# Patient Record
Sex: Female | Born: 1950
Health system: Southern US, Community
[De-identification: ages and names within clinical notes are randomized; demographics above are authoritative.]

## PROBLEM LIST (undated history)

## (undated) DIAGNOSIS — E785 Hyperlipidemia, unspecified: Secondary | ICD-10-CM

## (undated) DIAGNOSIS — G8929 Other chronic pain: Secondary | ICD-10-CM

## (undated) DIAGNOSIS — I10 Essential (primary) hypertension: Secondary | ICD-10-CM

## (undated) DIAGNOSIS — IMO0002 Reserved for concepts with insufficient information to code with codable children: Secondary | ICD-10-CM

## (undated) DIAGNOSIS — I7121 Aneurysm of the ascending aorta, without rupture: Secondary | ICD-10-CM

## (undated) DIAGNOSIS — G4700 Insomnia, unspecified: Secondary | ICD-10-CM

## (undated) DIAGNOSIS — N289 Disorder of kidney and ureter, unspecified: Secondary | ICD-10-CM

## (undated) DIAGNOSIS — E119 Type 2 diabetes mellitus without complications: Secondary | ICD-10-CM

## (undated) DIAGNOSIS — I2510 Atherosclerotic heart disease of native coronary artery without angina pectoris: Secondary | ICD-10-CM

## (undated) DIAGNOSIS — M549 Dorsalgia, unspecified: Secondary | ICD-10-CM

## (undated) DIAGNOSIS — I712 Thoracic aortic aneurysm, without rupture: Secondary | ICD-10-CM

## (undated) DIAGNOSIS — N183 Chronic kidney disease, stage 3 unspecified: Secondary | ICD-10-CM

## (undated) DIAGNOSIS — J309 Allergic rhinitis, unspecified: Secondary | ICD-10-CM

## (undated) HISTORY — DX: Atherosclerotic heart disease of native coronary artery without angina pectoris: I25.10

## (undated) HISTORY — DX: Other chronic pain: G89.29

## (undated) HISTORY — DX: Type 2 diabetes mellitus without complications: E11.9

## (undated) HISTORY — PX: CHOLECYSTECTOMY: SHX55

## (undated) HISTORY — DX: Thoracic aortic aneurysm, without rupture: I71.2

## (undated) HISTORY — DX: Essential (primary) hypertension: I10

## (undated) HISTORY — DX: Dorsalgia, unspecified: M54.9

## (undated) HISTORY — DX: Reserved for concepts with insufficient information to code with codable children: IMO0002

## (undated) HISTORY — DX: Chronic kidney disease, stage 3 unspecified: N18.30

## (undated) HISTORY — DX: Hyperlipidemia, unspecified: E78.5

## (undated) HISTORY — DX: Aneurysm of the ascending aorta, without rupture: I71.21

## (undated) HISTORY — PX: TUBAL LIGATION: SHX77

## (undated) HISTORY — DX: Insomnia, unspecified: G47.00

## (undated) HISTORY — DX: Allergic rhinitis, unspecified: J30.9

---
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
## 2000-11-17 ENCOUNTER — Encounter: Payer: Self-pay | Admitting: Emergency Medicine

## 2000-11-17 ENCOUNTER — Emergency Department (HOSPITAL_COMMUNITY): Admission: EM | Admit: 2000-11-17 | Discharge: 2000-11-17 | Payer: Self-pay

## 2010-12-23 ENCOUNTER — Encounter: Payer: Self-pay | Admitting: Internal Medicine

## 2010-12-24 ENCOUNTER — Ambulatory Visit (INDEPENDENT_AMBULATORY_CARE_PROVIDER_SITE_OTHER): Payer: Medicare Other | Admitting: Internal Medicine

## 2010-12-24 ENCOUNTER — Encounter: Payer: Self-pay | Admitting: Internal Medicine

## 2010-12-24 VITALS — BP 120/70 | HR 73 | Temp 97.7°F | Ht 64.0 in | Wt 283.0 lb

## 2010-12-24 DIAGNOSIS — R0609 Other forms of dyspnea: Secondary | ICD-10-CM

## 2010-12-24 DIAGNOSIS — R0600 Dyspnea, unspecified: Secondary | ICD-10-CM | POA: Insufficient documentation

## 2010-12-24 DIAGNOSIS — I10 Essential (primary) hypertension: Secondary | ICD-10-CM

## 2010-12-24 DIAGNOSIS — R059 Cough, unspecified: Secondary | ICD-10-CM

## 2010-12-24 DIAGNOSIS — R05 Cough: Secondary | ICD-10-CM

## 2010-12-24 MED ORDER — VALSARTAN-HYDROCHLOROTHIAZIDE 160-12.5 MG PO TABS: 1.0000 | ORAL_TABLET | Freq: Every day | ORAL | Status: DC

## 2010-12-24 NOTE — Progress Notes (Signed)
  Subjective:    Patient ID: Kara Nunez, female    DOB: 15-Sep-1950, 60 y.o.   MRN: [id number]  HPI  Refer by [name]  59 yowf never smoked with doe going all the way back to childhood eval by pulmonologist in [place] in 1980s with no specific finding;  and never had allergies/ asthma or need for meds.  12/24/2010  Initial pulmonary office eval on chronic ace cc cough and sob x sev years gradually  Worse with daily doe x across the room ok sitting still but has to prop head to sleep assoc with wheezing better p albuterol which helps mostly  dry cough assoc with nasal congestion and sneezing.  Pt denies any significant sore throat, dysphagia, itching, excess/ purulent nasal  secretions,  fever, chills, sweats, unintended wt loss, clasically  pleuritic or exertional cp (what she does have is migratory/ tightness, fleeitn, hempoptysis, orthopnea pnd or significant  leg swelling.    Also denies any obvious fluctuation of symptoms with weather or environmental changes or other aggravating or alleviating factors.     Review of Systems  Constitutional: Negative for fever, chills and unexpected weight change.  HENT: Positive for congestion and sneezing. Negative for ear pain, nosebleeds, sore throat, rhinorrhea, trouble swallowing, dental problem, voice change, postnasal drip and sinus pressure.   Eyes: Negative for visual disturbance.  Respiratory: Positive for cough and shortness of breath. Negative for choking.   Cardiovascular: Positive for chest pain and leg swelling.  Gastrointestinal: Negative for vomiting, abdominal pain and diarrhea.  Genitourinary: Negative for difficulty urinating.  Musculoskeletal: Negative for arthralgias.  Skin: Negative for rash.  Neurological: Negative for tremors, syncope and headaches.  Hematological: Does not bruise/bleed easily.       Objective:   Physical Exam    hoarse amb female nad.  Wt 283 12/24/10 with classic pseudowheeze with fvc, resolves with  plm  HEENT: nl dentition, turbinates, and orophanx. Nl external ear canals without cough reflex   NECK :  without JVD/Nodes/TM/ nl carotid upstrokes bilaterally   LUNGS: no acc muscle use, clear to A and P bilaterally without cough on insp or exp maneuvers   CV:  RRR  no s3 or murmur or increase in P2, no edema   ABD:  Obese but soft and nontender with nl excursion in the supine position. No bruits or organomegaly, bowel sounds nl  MS:  warm without deformities, calf tenderness, cyanosis or clubbing  SKIN: warm and dry without lesions    NEURO:  alert, approp, no deficits      Assessment & Plan:

## 2010-12-24 NOTE — Patient Instructions (Signed)
Stop lisnopril  Start diovan 160/12.5 one daily in place of lisinopril  Ok albuterol if helps your breathing or cough but you should find by the time you return you need it much less  GERD (REFLUX)  is an extremely common cause of respiratory symptoms, many times with no significant heartburn at all.    It can be treated with medication, but also with lifestyle changes including avoidance of late meals, excessive alcohol, smoking cessation, and avoid fatty foods, chocolate, peppermint, colas, red wine, and acidic juices such as orange juice.  NO MINT OR MENTHOL PRODUCTS SO NO COUGH DROPS  USE SUGARLESS CANDY INSTEAD (jolley ranchers or Stover's)  NO OIL BASED VITAMINS   Please schedule a follow up office visit in 4 weeks, sooner if needed with PFT's

## 2010-12-25 DIAGNOSIS — I10 Essential (primary) hypertension: Secondary | ICD-10-CM | POA: Insufficient documentation

## 2010-12-25 NOTE — Assessment & Plan Note (Addendum)
DDX of  difficult airways managment all start with A and  include Adherence, Ace Inhibitors, Acid Reflux, Active Sinus Disease, Alpha 1 Antitripsin deficiency, Anxiety masquerading as Airways dz,  ABPA,  allergy(esp in young), Aspiration (esp in elderly), Adverse effects of DPI,  Active smokers, plus two Bs  = Bronchiectasis and Beta blocker use..and one C= CHF   Ace inhibitors appear to be the leading suspect here and until she is off for at least 4 weeks there is no reason so look further but will arrange f/u pft's and suspect we'll see the classic pattern assoc with morbid obesity to explain her longstanding doe.

## 2010-12-25 NOTE — Assessment & Plan Note (Signed)

## 2011-01-26 ENCOUNTER — Encounter: Payer: Self-pay | Admitting: Internal Medicine

## 2011-01-26 ENCOUNTER — Other Ambulatory Visit (INDEPENDENT_AMBULATORY_CARE_PROVIDER_SITE_OTHER): Payer: Medicare Other

## 2011-01-26 ENCOUNTER — Ambulatory Visit (INDEPENDENT_AMBULATORY_CARE_PROVIDER_SITE_OTHER): Payer: Medicare Other | Admitting: Internal Medicine

## 2011-01-26 ENCOUNTER — Ambulatory Visit (INDEPENDENT_AMBULATORY_CARE_PROVIDER_SITE_OTHER)
Admission: RE | Admit: 2011-01-26 | Discharge: 2011-01-26 | Disposition: A | Discharge status: Home / Self Care | Payer: Medicare Other | Source: Ambulatory Visit | Attending: Internal Medicine | Admitting: Internal Medicine

## 2011-01-26 VITALS — BP 144/84 | HR 75 | Temp 97.9°F | Ht 64.0 in | Wt 279.0 lb

## 2011-01-26 DIAGNOSIS — R0600 Dyspnea, unspecified: Secondary | ICD-10-CM

## 2011-01-26 DIAGNOSIS — R0989 Other specified symptoms and signs involving the circulatory and respiratory systems: Secondary | ICD-10-CM

## 2011-01-26 DIAGNOSIS — R0609 Other forms of dyspnea: Secondary | ICD-10-CM

## 2011-01-26 DIAGNOSIS — I10 Essential (primary) hypertension: Secondary | ICD-10-CM

## 2011-01-26 LAB — BASIC METABOLIC PANEL
CO2: 31 mEq/L (ref 19–32)
Chloride: 103 mEq/L (ref 96–112)
Glucose, Bld: 140 mg/dL — ABNORMAL HIGH (ref 70–99)
Potassium: 3.8 mEq/L (ref 3.5–5.1)
Sodium: 143 mEq/L (ref 135–145)

## 2011-01-26 LAB — CBC WITH DIFFERENTIAL/PLATELET
Basophils Relative: 0.2 % (ref 0.0–3.0)
Eosinophils Absolute: 0.3 10*3/uL (ref 0.0–0.7)
Eosinophils Relative: 4.4 % (ref 0.0–5.0)
HCT: 39.6 % (ref 36.0–46.0)
Hemoglobin: 13.4 g/dL (ref 12.0–15.0)
Lymphs Abs: 1.8 10*3/uL (ref 0.7–4.0)
MCHC: 33.7 g/dL (ref 30.0–36.0)
MCV: 84.4 fl (ref 78.0–100.0)
Monocytes Absolute: 0.4 10*3/uL (ref 0.1–1.0)
Neutro Abs: 3.6 10*3/uL (ref 1.4–7.7)
Neutrophils Relative %: 59.9 % (ref 43.0–77.0)
RBC: 4.69 Mil/uL (ref 3.87–5.11)
WBC: 6.1 10*3/uL (ref 4.5–10.5)

## 2011-01-26 LAB — PULMONARY FUNCTION TEST

## 2011-01-26 MED ORDER — IOHEXOL 300 MG/ML  SOLN
100.0000 mL | Freq: Once | INTRAMUSCULAR | Status: AC | PRN
  Administered 2011-01-26: 100 mL via INTRAVENOUS

## 2011-01-26 MED ORDER — VALSARTAN-HYDROCHLOROTHIAZIDE 160-25 MG PO TABS: 1.0000 | ORAL_TABLET | Freq: Every day | ORAL | Status: DC

## 2011-01-26 NOTE — Progress Notes (Signed)
PFT done today. 

## 2011-01-26 NOTE — Patient Instructions (Addendum)
Please see patient coordinator before you leave today  to schedule ct angiogram with contrast to be sure you're not having blood clots  Increase diovan to 160/25 one daily (ok to use up the ones you have by taking 2 daily until gone)  Return in 3 weeks with all medications in hand

## 2011-01-26 NOTE — Progress Notes (Signed)
   Subjective:    Patient ID: Kara Nunez, female    DOB: November 30, 1950, 60 y.o.   MRN: [id number]  HPI  Refer by [name]  59 yowf never smoked with doe going all the way back to childhood eval by pulmonologist in [place] in 1980s with no specific finding;  and never had allergies/ asthma or need for meds.  12/24/2010  Initial pulmonary office eval on chronic ace cc cough and sob x sev years gradually  Worse  But especially x sev months with daily doe x across the room ok sitting still but has to prop head to sleep assoc with wheezing better p albuterol which helps mostly  dry cough assoc with nasal congestion and sneezing. rec Stop lisnopril  Start diovan 160/12.5 one daily in place of lisinopril Ok  To use albuterol if helps your breathing or cough but you should find by the time you return you need it much less GERD (REFLUX) diet  01/26/2011 ov/ Wert cc doe better but still sob with anything more than room to room.  No cough.  Pt denies any significant sore throat, dysphagia, itching, sneezing,  nasal congestion or excess/ purulent secretions,  fever, chills, sweats, unintended wt loss, pleuritic or exertional cp, hempoptysis, orthopnea pnd or leg swelling.    Also denies any obvious fluctuation of symptoms with weather or environmental changes or other aggravating or alleviating factors.         Objective:   Physical Exam   minimally  hoarse amb female nad.  Wt 283 12/24/10  > 279 01/26/2011  No longer with classic pseudowheeze    HEENT: nl dentition, turbinates, and orophanx. Nl external ear canals without cough reflex   NECK :  without JVD/Nodes/TM/ nl carotid upstrokes bilaterally   LUNGS: no acc muscle use, clear to A and P bilaterally without cough on insp or exp maneuvers   CV:  RRR  no s3 or murmur or increase in P2, no edema   ABD:  Obese but soft and nontender with nl excursion in the supine position. No bruits or organomegaly, bowel sounds nl  MS:  warm without  deformities, calf tenderness, cyanosis or clubbing        Assessment & Plan:

## 2011-01-26 NOTE — Assessment & Plan Note (Signed)
Cough and pseudowheeze resolved off ace despite pt's perception not much better. For now rec leave off and increase the diovan to 160/25 one daily

## 2011-01-26 NOTE — Assessment & Plan Note (Addendum)
Not much better off ace with no explanation for sob based on pft's x for erv very low so proceed with w/u with ct angiogram/ labs

## 2011-01-27 ENCOUNTER — Telehealth: Payer: Self-pay | Admitting: Internal Medicine

## 2011-01-27 NOTE — Telephone Encounter (Signed)
lmomtcb x1  Call patient : Studies are unremarkable, no change in recs

## 2011-01-27 NOTE — Telephone Encounter (Signed)
Pt aware of results 

## 2011-02-02 ENCOUNTER — Encounter: Payer: Self-pay | Admitting: Internal Medicine

## 2011-02-02 ENCOUNTER — Telehealth: Payer: Self-pay | Admitting: Internal Medicine

## 2011-02-02 NOTE — Telephone Encounter (Signed)
Spoke with pt and notified of lab results. Her records were faxed to Dr [name] per her request.

## 2011-02-02 NOTE — Progress Notes (Signed)
Quick Note:  Spoke with pt and notified of results per Dr. Wert. Pt verbalized understanding and denied any questions.  ______ 

## 2011-02-03 ENCOUNTER — Telehealth: Payer: Self-pay | Admitting: Internal Medicine

## 2011-02-03 NOTE — Telephone Encounter (Signed)
Patient phoned returning a call to triage she can be reached at [phone].Kara Nunez

## 2011-02-03 NOTE — Telephone Encounter (Signed)
lmomtcb x 1. CT report sent to Dr. Lynetta Mare.

## 2011-02-03 NOTE — Telephone Encounter (Signed)
I spoke with the patient and she states Dr. [name] office did not receive fax, so I refaxed ad called office at [phone] and they did receive it. Pt aware. Elberta Bing, CMA

## 2011-02-16 ENCOUNTER — Encounter (INDEPENDENT_AMBULATORY_CARE_PROVIDER_SITE_OTHER): Payer: Medicare Other | Admitting: Thoracic Surgery (Cardiothoracic Vascular Surgery)

## 2011-02-16 ENCOUNTER — Encounter: Payer: Self-pay | Admitting: Internal Medicine

## 2011-02-16 ENCOUNTER — Encounter: Payer: Medicare Other | Admitting: Thoracic Surgery (Cardiothoracic Vascular Surgery)

## 2011-02-16 ENCOUNTER — Ambulatory Visit: Payer: Medicare Other | Admitting: Internal Medicine

## 2011-02-16 ENCOUNTER — Ambulatory Visit (INDEPENDENT_AMBULATORY_CARE_PROVIDER_SITE_OTHER): Payer: Medicare Other | Admitting: Internal Medicine

## 2011-02-16 VITALS — BP 126/82 | HR 73 | Temp 97.3°F | Ht 64.0 in | Wt 282.4 lb

## 2011-02-16 DIAGNOSIS — R0600 Dyspnea, unspecified: Secondary | ICD-10-CM

## 2011-02-16 DIAGNOSIS — I712 Thoracic aortic aneurysm, without rupture, unspecified: Secondary | ICD-10-CM

## 2011-02-16 DIAGNOSIS — R0609 Other forms of dyspnea: Secondary | ICD-10-CM

## 2011-02-16 DIAGNOSIS — I10 Essential (primary) hypertension: Secondary | ICD-10-CM

## 2011-02-16 DIAGNOSIS — R0989 Other specified symptoms and signs involving the circulatory and respiratory systems: Secondary | ICD-10-CM

## 2011-02-16 MED ORDER — VALSARTAN-HYDROCHLOROTHIAZIDE 160-25 MG PO TABS: 1.0000 | ORAL_TABLET | Freq: Every day | ORAL | Status: DC

## 2011-02-16 MED ORDER — PANTOPRAZOLE SODIUM 40 MG PO TBEC: DELAYED_RELEASE_TABLET | ORAL | Status: DC

## 2011-02-16 NOTE — Progress Notes (Signed)
   Subjective:    Patient ID: Kara Nunez, female    DOB: October 03, 1950, 60 y.o.   MRN: [id number]  HPI  Refer by Kara Nunez  59 yowf never smoked with doe going all the way back to childhood eval by pulmonologist in [place] in 1980s with no specific finding;  and never had allergies/ asthma or need for meds.  12/24/2010  Initial pulmonary office eval on chronic ace cc cough and sob x sev years gradually  Worse  But especially x sev months with daily doe x across the room ok sitting still but has to prop head to sleep assoc with wheezing better p albuterol which helps mostly  dry cough assoc with nasal congestion and sneezing. rec Stop lisnopril  Start diovan 160/12.5 one daily in place of lisinopril Ok  To use albuterol if helps your breathing or cough but you should find by the time you return you need it much less GERD (REFLUX) diet  01/26/2011 ov/ Inita Uram cc doe better but still sob with anything more than room to room.  desat on walking > CT Chest Pos AAA no ILD or PE.  PFT's c/w obesity, not airflow obstruction.  rec T surgery eval/ wt loss   02/16/2011 ov/ Brodyn Depuy cc doe x room to room due to sob and sleep on 3 pillows and ambien with no noct awakening due to resp complaints but wakes up feeling tired but no cough or wheeze. Some better with albuterol.    Pt denies any significant sore throat, dysphagia, itching, sneezing,  nasal congestion or excess/ purulent secretions,  fever, chills, sweats, unintended wt loss, pleuritic or exertional cp, hempoptysis, orthopnea pnd or leg swelling.    Also denies any obvious fluctuation of symptoms with weather or environmental changes or other aggravating or alleviating factors.       Objective:   Physical Exam   minimally  hoarse amb female nad.  Wt 283 12/24/10  > 279 01/26/2011 >  282 02/16/2011  No longer with classic pseudowheeze    HEENT: nl dentition, turbinates, and orophanx. Nl external ear canals without cough reflex   NECK :  without  JVD/Nodes/TM/ nl carotid upstrokes bilaterally   LUNGS: no acc muscle use, clear to A and P bilaterally without cough on insp or exp maneuvers   CV:  RRR  no s3 or murmur or increase in P2, no edema   ABD:  Obese but soft and nontender with nl excursion in the supine position. No bruits or organomegaly, bowel sounds nl  MS:  warm without deformities, calf tenderness, cyanosis or clubbing        Assessment & Plan:

## 2011-02-16 NOTE — Assessment & Plan Note (Addendum)
Desats with ex and needs to lose wt to consider surgery for TA (very poor candidate at present)  I had an extended discussion with the patient today lasting 15 to 20 minutes of a 25 minute visit on the following issues:   Wheezing may be due to lopressor (doubt since not picking up airflow obstruction on pft's) vs upper airway cough syndrome, which fits since not able to tolerate ace so next step is GERD rx  Ex desaturation preventing wt loss > rec 02 2lpm with ex  See instructions for specific recommendations which were reviewed directly with the patient who was given a copy with highlighter outlining the key components.

## 2011-02-16 NOTE — Assessment & Plan Note (Signed)
Consider Strongly prefer in this setting: Bystolic, the most beta -1  selective Beta blocker available in sample form, with bisoprolol the most selective generic choice  on the market, but for now leave on lopressor as really need tight bp control and doing ok on lopressor

## 2011-02-16 NOTE — Assessment & Plan Note (Addendum)
TSH nl 01/26/11 See instructions for specific recommendations which were reviewed directly with the patient who was given a copy with highlighter outlining the key components.

## 2011-02-16 NOTE — Patient Instructions (Addendum)
Weight control is simply a matter of calorie balance which needs to be tilted in your favor by eating less and exercising more.  To get the most out of exercise, you need to be continuously aware that you are short of breath, but never out of breath, for 30 minutes daily. As you improve, it will actually be easier for you to do the same amount of exercise  in  30 minutes so always push to the level where you are short of breath.  If this does not result in gradual weight reduction then I strongly recommend you see a nutritionist with a food diary x 2 weeks so that we can work out a negative calorie balance which is universally effective in steady weight loss programs.  Think of your calorie balance like you do your bank account where in this case you want the balance to go down so you must take in less calories than you burn up.  It's just that simple:  Hard to do, but easy to understand.  Good luck!   Start Pantoprazole 40 mg Take 30-60 min before first meal of the day   GERD (REFLUX)  is an extremely common cause of respiratory symptoms, many times with no significant heartburn at all.    It can be treated with medication, but also with lifestyle changes including avoidance of late meals, excessive alcohol, smoking cessation, and avoid fatty foods, chocolate, peppermint, colas, red wine, and acidic juices such as orange juice.  NO MINT OR MENTHOL PRODUCTS SO NO COUGH DROPS  USE SUGARLESS CANDY INSTEAD (jolley ranchers or Stover's)  NO OIL BASED VITAMINS  Wear 02 whenever you doing more than just walking around your house and we also check you overnight to be sure your oxygen  Level is ok sleeping  Please schedule a follow up office visit in 6 weeks, call sooner if needed

## 2011-02-16 NOTE — Progress Notes (Signed)
SATURATION QUALIFICATIONS:  Patient Saturations on Room Air at Rest = 96%  Patient Saturations on Room Air while Ambulating = 88%  Patient Saturations on 2 Liters of oxygen while Ambulating = 94%

## 2011-02-17 ENCOUNTER — Ambulatory Visit (HOSPITAL_COMMUNITY)
Admission: RE | Admit: 2011-02-17 | Discharge: 2011-02-17 | Disposition: A | Discharge status: Home / Self Care | Payer: Medicare Other | Source: Ambulatory Visit | Attending: Thoracic Surgery (Cardiothoracic Vascular Surgery) | Admitting: Thoracic Surgery (Cardiothoracic Vascular Surgery)

## 2011-02-17 DIAGNOSIS — R011 Cardiac murmur, unspecified: Secondary | ICD-10-CM | POA: Insufficient documentation

## 2011-02-17 DIAGNOSIS — R0602 Shortness of breath: Secondary | ICD-10-CM | POA: Insufficient documentation

## 2011-02-17 NOTE — Consult Note (Signed)
NEW PATIENT CONSULTATION  Kara Nunez, DEHOOG DOB:  [birth year]                                        February 16, 2011 CHART #:  [id number]  REASON FOR CONSULTATION:  Ascending thoracic aortic aneurysm.  Ms. Otteson is a 60 year old woman with multiple medical problems including hypertension, hypercholesterolemia, diabetes, history of angina, and possible asthma versus obesity hypoventilation syndrome. She was recently being evaluated for chronic shortness of breath. Evaluation included pulmonary function testing which appeared to be consistent with restrictive hypoventilation.  A CT of the chest showed no evidence of interstitial lung disease or pulmonary embolus.  She was noted, however, to have an ascending thoracic aortic aneurysm.  This was measured at 4.5 x 4.3 cm by the radiologist on the axial views.  She states that she still does have occasional anginal symptoms.  She had very it pretty severely back in the 1980s and had a catheterization at that time.  Also, there were no significant cardiac issues.  Unclear if she had any echocardiogram.  Evaluations were done in [place].  I do not have those records.  Her biggest complaint is shortness of breath with exertion and orthopnea.  She has to sleep on 3 pillows.  She has also had some peripheral edema, but the shortness of breath is her primary complaint that led to her recent workup.  PAST MEDICAL HISTORY:  Significant for: 1. Hypertension. 2. Hyperlipidemia. 3. Diabetes, non-insulin dependent. 4. Arthritis. 5. Anemia. 6. Depression. 7. Bipolar.  CURRENT MEDICATIONS: 1. Cozaar 100 mg daily. 2. Accuretic 20/25 one tablet daily. 3. Toprol-XL 100 mg daily. 4. TriCor 145 mg daily. 5. Lipitor 10 mg daily. 6. Glucophage 1000 mg b.i.d. 7. Zyrtec 10 mg daily. 8. Lodine 400 mg b.i.d. 9. Darvocet-N 100, 100 mg p.o. q.8 h. P.r.n. 10.Cymbalta 60 mg daily. 11.Ambien CR 12.5 mg nightly. 12.Flexeril 10 mg p.o.  nightly which has been discontinued. 13.Nitro-Dur patch. 14.Sublingual nitroglycerin p.r.n.  She has allergies to CODEINE and AMOXICILLIN.  FAMILY HISTORY:  Negative for aneurysms, but positive for diabetes and coronary artery disease.  SOCIAL HISTORY:  She is single.  She is retired.  She was an early Research officer, trade [employer].  She has never smoked.  REVIEW OF SYSTEMS:  Chest tightness and pressure really very rarely currently, shortness of breath when lying flat, requires 3 pillows. Shortness of breath with anything more than minimal exertion.  She also complains of dizziness, arthritis, muscle pain, depression, and anemia. She is unsure if she has a heart murmur, but believes she was told that in the past.  PHYSICAL EXAMINATION:  VITAL SIGNS:  Blood pressure is 150/82, pulse 72, respirations 20, and oxygen saturation 93% on room air. GENERAL:  She is morbidly obese, but in no acute distress. HEENT:  Unremarkable. NEUROLOGICAL:  She is alert and oriented x3 with no focal deficits. NECK:  Supple without thyromegaly, adenopathy, or bruits. CARDIAC:  Regular rate and rhythm.  There is a 2/6 systolic murmur heard throughout precordium mostly at the left and right upper sternal borders. ABDOMEN:  Soft and nontender. EXTREMITIES:  1+ edema.  She does have palpable pulses.  There is no palpable popliteal or femoral aneurysms.  Due to the patient's obesity, abdominal aneurysm would be unlikely to be palpable.  LABORATORY DATA:  CT of the chest reviewed, findings as noted.  On the coronal views, the  largest diameter of the aorta appears to be 3.8 cm. On the sagittal views, it is a technically poor study with a lot of volume averaging, but would appear it may be as large as 4.3 cm.  There is no question the axial is not a true cross-sectional view.  IMPRESSION:  Ms. Dewilde is a 60 year old woman with a newly discovered 4.3 -cm ascending aortic aneurysm.  This is in the setting of  a relatively large aorta and great vessels, it is kind of a generalized arteriomegaly picture.  I had a long discussion with her regarding the implications of this.  This is large enough that needs to be followed, but at the current time it is certainly less dangerous to her than the surgery to try to replace the aorta would be.  General indications for surgery and aneurysm in this location would be 5.5-6 cm or growth rate of greater than 5 mm in 6 months to a year.  She does not have an indication for surgery at this time and there is no reason to suspect that her aorta has anything to do with her shortness of breath.  She does have a heart murmur.  I do think it is reasonable to get an echocardiogram to mainly look at her aortic valve to make sure there is not any associated aortic stenosis or aortic insufficiency.  We will go ahead and order that.  I also plan to see her back in 6 months with repeat CT angio to look at the ascending aorta.  In summary, there is no indication for surgery at the present time, but this does need to be followed.  Revonda Standard [name], M.D. Electronically Signed  SCH/MEDQ  D:  02/16/2011  T:  02/17/2011  Job:  [id number]  cc:   Octavio Graves, MD [name], PA Christena Deem. Melvyn Novas, MD, FCCP

## 2011-03-09 ENCOUNTER — Encounter: Payer: Self-pay | Admitting: Internal Medicine

## 2011-03-09 ENCOUNTER — Other Ambulatory Visit: Payer: Self-pay | Admitting: Internal Medicine

## 2011-03-09 DIAGNOSIS — J9610 Chronic respiratory failure, unspecified whether with hypoxia or hypercapnia: Secondary | ICD-10-CM | POA: Insufficient documentation

## 2011-03-11 ENCOUNTER — Other Ambulatory Visit: Payer: Self-pay | Admitting: Internal Medicine

## 2011-03-11 DIAGNOSIS — R0600 Dyspnea, unspecified: Secondary | ICD-10-CM

## 2011-03-12 ENCOUNTER — Telehealth: Payer: Self-pay | Admitting: Internal Medicine

## 2011-03-12 NOTE — Telephone Encounter (Signed)
Mw reviewed ONO on RA and states needs oxygen at HS- 2lpm. Also needs repeat ONO on 2lpm. LMTCB.

## 2011-03-12 NOTE — Telephone Encounter (Signed)
Encounter signed in error   °

## 2011-03-16 ENCOUNTER — Encounter: Payer: Self-pay | Admitting: Internal Medicine

## 2011-03-16 NOTE — Telephone Encounter (Signed)
lmtcb

## 2011-03-23 NOTE — Telephone Encounter (Signed)
lmomtcb  

## 2011-03-24 ENCOUNTER — Telehealth: Payer: Self-pay | Admitting: Internal Medicine

## 2011-03-24 NOTE — Telephone Encounter (Signed)
Spoke with pt regarding ONO results. She states that she is already aware needs o2 2lpm at hs, and the ONO on 2lpm has already been done.

## 2011-03-31 ENCOUNTER — Telehealth: Payer: Self-pay | Admitting: Internal Medicine

## 2011-03-31 DIAGNOSIS — R0902 Hypoxemia: Secondary | ICD-10-CM

## 2011-03-31 NOTE — Telephone Encounter (Signed)
Per MW- ONO on 2lpm was not sufficient enough> therefore needs ONO repeated on 3lpm. Spoke with pt and notified her of this and she verbalized understanding. Order sent to [facility] for this.

## 2011-04-12 ENCOUNTER — Encounter: Payer: Self-pay | Admitting: Internal Medicine

## 2011-04-28 ENCOUNTER — Encounter: Payer: Self-pay | Admitting: Internal Medicine

## 2011-05-06 ENCOUNTER — Telehealth: Payer: Self-pay | Admitting: Internal Medicine

## 2011-05-06 NOTE — Telephone Encounter (Signed)
Per MW- ONO on 3 lpm looks good, needs to stay on this. Spoke with pt and notified of recs and she verbalized understanding and denied any questions.

## 2011-05-17 ENCOUNTER — Other Ambulatory Visit: Payer: Self-pay | Admitting: Internal Medicine

## 2011-05-20 ENCOUNTER — Encounter: Payer: Self-pay | Admitting: Internal Medicine

## 2011-07-29 ENCOUNTER — Other Ambulatory Visit: Payer: Self-pay | Admitting: Thoracic Surgery (Cardiothoracic Vascular Surgery)

## 2011-07-29 DIAGNOSIS — I712 Thoracic aortic aneurysm, without rupture, unspecified: Secondary | ICD-10-CM

## 2011-08-16 ENCOUNTER — Other Ambulatory Visit: Payer: Self-pay | Admitting: Thoracic Surgery (Cardiothoracic Vascular Surgery)

## 2011-08-18 ENCOUNTER — Encounter: Payer: Self-pay | Admitting: Thoracic Surgery (Cardiothoracic Vascular Surgery)

## 2011-08-18 ENCOUNTER — Ambulatory Visit
Admission: RE | Admit: 2011-08-18 | Discharge: 2011-08-18 | Disposition: A | Discharge status: Home / Self Care | Payer: Medicare Other | Source: Ambulatory Visit | Attending: Thoracic Surgery (Cardiothoracic Vascular Surgery) | Admitting: Thoracic Surgery (Cardiothoracic Vascular Surgery)

## 2011-08-18 ENCOUNTER — Ambulatory Visit (INDEPENDENT_AMBULATORY_CARE_PROVIDER_SITE_OTHER): Payer: Medicare Other | Admitting: Thoracic Surgery (Cardiothoracic Vascular Surgery)

## 2011-08-18 VITALS — BP 167/82 | HR 70 | Resp 16

## 2011-08-18 DIAGNOSIS — I712 Thoracic aortic aneurysm, without rupture, unspecified: Secondary | ICD-10-CM

## 2011-08-18 MED ORDER — IOHEXOL 300 MG/ML  SOLN
100.0000 mL | Freq: Once | INTRAMUSCULAR | Status: AC | PRN
  Administered 2011-08-18: 100 mL via INTRAVENOUS

## 2011-08-18 NOTE — Progress Notes (Signed)
HPI: Kara Nunez returns today for 6 month followup of her ascending aortic aneurysm. During a pulmonary workup earlier this year a CT scan showed a 4.5 x 4.3 cm ascending aorta. She states that in the interval since her last visit she has been feeling much better. She feels that starting home oxygen has helped tremendously. She doesn't take it with her when she is out of the house but does use it at home particularly at night when she is sleeping. She does not check her blood pressure at home. She denies any chest pain. And as noted or shortness of breath has improved.  Past Medical History  Diagnosis Date  . CAD (coronary artery disease)   . Diabetes mellitus   . Hypertension   . Hyperlipidemia   Anemia Depression Bipolar disorder  Current Outpatient Prescriptions  Medication Sig Dispense Refill  . albuterol (PROAIR HFA) 108 (90 BASE) MCG/ACT inhaler Inhale 2 puffs into the lungs every 6 (six) hours as needed.        Marland Kitchen amLODipine (NORVASC) 10 MG tablet Take 10 mg by mouth daily.        . ARIPiprazole (ABILIFY) 20 MG tablet Take 20 mg by mouth daily.        . DULoxetine (CYMBALTA) 60 MG capsule Take 60 mg by mouth daily.        Marland Kitchen etodolac (LODINE) 400 MG tablet Take 400 mg by mouth daily.        . fenofibrate micronized (LOFIBRA) 134 MG capsule Take 134 mg by mouth daily before breakfast.        . hydrochlorothiazide 25 MG tablet Take 25 mg by mouth daily.        . hydrocodone-acetaminophen (LORCET-HD) 5-500 MG per capsule Take 1 capsule by mouth every 6 (six) hours as needed.        . metFORMIN (GLUCOPHAGE) 1000 MG tablet Take 1,000 mg by mouth 2 (two) times daily with a meal.        . metoprolol (TOPROL-XL) 100 MG 24 hr tablet Take 100 mg by mouth daily.        . niacin (NIASPAN) 1000 MG CR tablet Take 1,000 mg by mouth at bedtime.        . nitroGLYCERIN (NITRODUR - DOSED IN MG/24 HR) 0.1 mg/hr Place 1 patch onto the skin daily as needed.        . pantoprazole (PROTONIX) 40 MG tablet  TAKE 30-60 MINUTES BEFORE FIRST MEAL OF THE DAY  30 tablet  5  . pioglitazone (ACTOS) 30 MG tablet Take 30 mg by mouth daily.        . rosuvastatin (CRESTOR) 20 MG tablet Take 20 mg by mouth daily.        . valsartan-hydrochlorothiazide (DIOVAN HCT) 160-25 MG per tablet Take 1 tablet by mouth daily.  30 tablet  11  . Vitamin D, Ergocalciferol, (DRISDOL) 50000 UNITS CAPS Take 50,000 Units by mouth 2 (two) times a week.        . zolpidem (AMBIEN) 10 MG tablet Take 10 mg by mouth at bedtime as needed.         No current facility-administered medications for this visit.   Facility-Administered Medications Ordered in Other Visits  Medication Dose Route Frequency Provider Last Rate Last Dose  . iohexol (OMNIPAQUE) 300 MG/ML injection 100 mL  100 mL Intravenous Once PRN Medication Radiologist   100 mL at 08/18/11 1346     Physical Exam: BP 167/82  Pulse 70  Resp  16  SpO2 94% Gen. morbidly obese, in no acute distress Neuro alert and oriented x3 Neck no bruits Cardiac regular rate and rhythm normal S1 and S2, 2/6 systolic murmur Extremities 2+ pulses throughout    Diagnostic Tests: CT of the chest reviewed appears to be no change in comparison to CT from 01/26/2011 Final radiologist reading pending   Impression: 60 year old woman with small asymptomatic ascending aortic aneurysm, unchanged in 6 months.  No indication for surgery at this time  Plan: Repeat CT angiogram chest in one year.

## 2011-11-12 ENCOUNTER — Other Ambulatory Visit: Payer: Self-pay | Admitting: Internal Medicine

## 2011-12-13 ENCOUNTER — Other Ambulatory Visit: Payer: Self-pay | Admitting: Internal Medicine

## 2012-02-10 ENCOUNTER — Other Ambulatory Visit: Payer: Self-pay | Admitting: Internal Medicine

## 2012-02-20 ENCOUNTER — Emergency Department (HOSPITAL_COMMUNITY): Payer: Medicare Other

## 2012-02-20 ENCOUNTER — Encounter (HOSPITAL_COMMUNITY): Payer: Self-pay | Admitting: *Deleted

## 2012-02-20 ENCOUNTER — Observation Stay (HOSPITAL_COMMUNITY)
Admission: EM | Admit: 2012-02-20 | Discharge: 2012-02-22 | Disposition: A | Discharge status: Home / Self Care | Payer: Medicare Other | Source: Ambulatory Visit | Attending: Internal Medicine | Admitting: Internal Medicine

## 2012-02-20 DIAGNOSIS — R0600 Dyspnea, unspecified: Secondary | ICD-10-CM

## 2012-02-20 DIAGNOSIS — I5032 Chronic diastolic (congestive) heart failure: Secondary | ICD-10-CM | POA: Insufficient documentation

## 2012-02-20 DIAGNOSIS — Z9981 Dependence on supplemental oxygen: Secondary | ICD-10-CM | POA: Insufficient documentation

## 2012-02-20 DIAGNOSIS — I2510 Atherosclerotic heart disease of native coronary artery without angina pectoris: Secondary | ICD-10-CM | POA: Insufficient documentation

## 2012-02-20 DIAGNOSIS — I2789 Other specified pulmonary heart diseases: Secondary | ICD-10-CM | POA: Insufficient documentation

## 2012-02-20 DIAGNOSIS — E119 Type 2 diabetes mellitus without complications: Secondary | ICD-10-CM | POA: Insufficient documentation

## 2012-02-20 DIAGNOSIS — I10 Essential (primary) hypertension: Secondary | ICD-10-CM | POA: Diagnosis present

## 2012-02-20 DIAGNOSIS — F329 Major depressive disorder, single episode, unspecified: Secondary | ICD-10-CM | POA: Insufficient documentation

## 2012-02-20 DIAGNOSIS — I714 Abdominal aortic aneurysm, without rupture, unspecified: Secondary | ICD-10-CM

## 2012-02-20 DIAGNOSIS — I712 Thoracic aortic aneurysm, without rupture, unspecified: Secondary | ICD-10-CM

## 2012-02-20 DIAGNOSIS — E785 Hyperlipidemia, unspecified: Secondary | ICD-10-CM | POA: Insufficient documentation

## 2012-02-20 DIAGNOSIS — R079 Chest pain, unspecified: Secondary | ICD-10-CM | POA: Diagnosis present

## 2012-02-20 DIAGNOSIS — K219 Gastro-esophageal reflux disease without esophagitis: Secondary | ICD-10-CM | POA: Insufficient documentation

## 2012-02-20 DIAGNOSIS — I7121 Aneurysm of the ascending aorta, without rupture: Secondary | ICD-10-CM | POA: Diagnosis present

## 2012-02-20 DIAGNOSIS — F3289 Other specified depressive episodes: Secondary | ICD-10-CM | POA: Insufficient documentation

## 2012-02-20 DIAGNOSIS — I509 Heart failure, unspecified: Secondary | ICD-10-CM | POA: Insufficient documentation

## 2012-02-20 DIAGNOSIS — J45909 Unspecified asthma, uncomplicated: Secondary | ICD-10-CM | POA: Diagnosis present

## 2012-02-20 DIAGNOSIS — R0789 Other chest pain: Principal | ICD-10-CM | POA: Insufficient documentation

## 2012-02-20 DIAGNOSIS — J9610 Chronic respiratory failure, unspecified whether with hypoxia or hypercapnia: Secondary | ICD-10-CM | POA: Diagnosis present

## 2012-02-20 LAB — COMPREHENSIVE METABOLIC PANEL
ALT: 15 U/L (ref 0–35)
ALT: 14 U/L (ref 0–35)
AST: 19 U/L (ref 0–37)
AST: 17 U/L (ref 0–37)
CO2: 27 mEq/L (ref 19–32)
Calcium: 9.8 mg/dL (ref 8.4–10.5)
Chloride: 101 mEq/L (ref 96–112)
Creatinine, Ser: 0.63 mg/dL (ref 0.50–1.10)
GFR calc non Af Amer: >90 mL/min (ref >90)
Sodium: 140 mEq/L (ref 135–145)
Total Bilirubin: 0.3 mg/dL (ref 0.3–1.2)
Total Protein: 7.3 g/dL (ref 6.0–8.3)

## 2012-02-20 LAB — PROTIME-INR
INR: 0.94 (ref 0.00–1.49)
Prothrombin Time: 12.8 seconds (ref 11.6–15.2)

## 2012-02-20 LAB — DIFFERENTIAL
Basophils Absolute: 0 10*3/uL (ref 0.0–0.1)
Basophils Absolute: 0 10*3/uL (ref 0.0–0.1)
Basophils Relative: 0 % (ref 0–1)
Eosinophils Absolute: 0.2 10*3/uL (ref 0.0–0.7)
Eosinophils Relative: 2 % (ref 0–5)
Lymphocytes Relative: 25 % (ref 12–46)
Monocytes Absolute: 0.4 10*3/uL (ref 0.1–1.0)
Monocytes Relative: 6 % (ref 3–12)
Neutro Abs: 4.3 10*3/uL (ref 1.7–7.7)

## 2012-02-20 LAB — CBC
HCT: 40.5 % (ref 36.0–46.0)
Hemoglobin: 13.4 g/dL (ref 12.0–15.0)
MCH: 28.6 pg (ref 26.0–34.0)
MCHC: 33.5 g/dL (ref 30.0–36.0)
MCV: 85.3 fL (ref 78.0–100.0)
Platelets: 221 10*3/uL (ref 150–400)
RBC: 4.74 MIL/uL (ref 3.87–5.11)
RDW: 14.4 % (ref 11.5–15.5)
RDW: 14.4 % (ref 11.5–15.5)
WBC: 6.4 10*3/uL (ref 4.0–10.5)
WBC: 7.5 10*3/uL (ref 4.0–10.5)

## 2012-02-20 LAB — SAMPLE TO BLOOD BANK

## 2012-02-20 MED ORDER — SODIUM CHLORIDE 0.9 % IV SOLN
INTRAVENOUS | Status: DC
  Administered 2012-02-20: via INTRAVENOUS

## 2012-02-20 MED ORDER — ONDANSETRON HCL 4 MG/2ML IJ SOLN
4.0000 mg | Freq: Once | INTRAMUSCULAR | Status: AC
  Administered 2012-02-20: 4 mg via INTRAVENOUS
  Filled 2012-02-20: qty 2

## 2012-02-20 MED ORDER — MORPHINE SULFATE 4 MG/ML IJ SOLN
4.0000 mg | Freq: Once | INTRAMUSCULAR | Status: AC
  Administered 2012-02-20: 4 mg via INTRAVENOUS
  Filled 2012-02-20: qty 1

## 2012-02-20 NOTE — ED Notes (Signed)
Pt presented with rt side pain from rt lower part of the breast to back.Pain started from Saturday morning.She says she is feeling better now but has been sever last two days.

## 2012-02-20 NOTE — ED Notes (Signed)
The pt woke up with rt lower rib pain and into her rt posterior chest yesterday.  No rash.   Some sob.  No cough.  The pain was better after using her inhaler earlier today

## 2012-02-20 NOTE — ED Provider Notes (Cosign Needed)
History     CSN: [id number]  Arrival date & time 02/20/12  2022   First MD Initiated Contact with Patient 02/20/12 2154      Chief Complaint  Patient presents with  . Chest Pain    (Consider location/radiation/quality/duration/timing/severity/associated sxs/prior treatment) HPI Comments: Patient is a 61 year old woman who woke up yesterday morning with chest pain. She felt it in the right anterior chest at the level of their costal margin and the pain radiated around to the right side and into her lower back. Over the 24 hours her pain has just gotten worse. She rates her pain at a 6 currently. She has a known thoracic aortic aneurysm. She has been advised if she develops chest pain that she should come to the hospital for immediate evaluation. She has a prior history of hypertension, diabetes, asthma, esophageal reflux, and high cholesterol.  Patient is a 61 y.o. female presenting with chest pain. The history is provided by the patient.  Chest Pain The chest pain began yesterday. Episode Length: He has intermittent pain in her right anterior chest it radiates to the right costal margin and around to her back. Chest pain occurs intermittently. The chest pain is unchanged. Associated with: Nothing. At its most intense, the pain is at 6/10. The pain is currently at 6/10. The severity of the pain is moderate. The quality of the pain is described as sharp. The pain radiates to the mid back. Exacerbated by: Nothing. Pertinent negatives for primary symptoms include no fever, no syncope, no shortness of breath, no abdominal pain, no nausea and no vomiting. She tried nothing for the symptoms. Risk factors: Known thoracic aortic aneurysm.  Her past medical history is significant for aneurysm, aortic aneurysm, diabetes, hyperlipidemia and hypertension. Procedure history comments: She has been followed with CT x-rays by Dr. [name], cardiac surgeon..     Past Medical History  Diagnosis Date  . CAD  (coronary artery disease)   . Diabetes mellitus   . Hypertension   . Hyperlipidemia     Past Surgical History  Procedure Date  . Cholecystectomy   . Tubal ligation     Family History  Problem Relation Age of Onset  . Asthma Mother   . Allergies Mother   . Allergies Maternal Grandmother   . Heart disease Maternal Grandfather   . Heart disease Maternal Grandmother   . Heart disease Paternal Grandfather   . Heart disease Paternal Grandmother   . Heart disease Mother   . Breast cancer Maternal Grandmother     History  Substance Use Topics  . Smoking status: Never Smoker   . Smokeless tobacco: Never Used  . Alcohol Use: Yes     rare ETOH- only on occ    OB History    Grav Para Term Preterm Abortions TAB SAB Ect Mult Living                  Review of Systems  Constitutional: Negative.  Negative for fever.  HENT: Negative.   Eyes: Negative.   Respiratory: Negative.  Negative for shortness of breath.   Cardiovascular: Positive for chest pain. Negative for syncope.  Gastrointestinal: Negative.  Negative for nausea, vomiting and abdominal pain.  Genitourinary: Negative.   Musculoskeletal: Positive for back pain.  Neurological: Negative.   Psychiatric/Behavioral: Negative.     Allergies  Codeine and Amoxicillin  Home Medications   Current Outpatient Rx  Name Route Sig Dispense Refill  . ALBUTEROL SULFATE HFA 108 (90 BASE) MCG/ACT IN  AERS Inhalation Inhale 2 puffs into the lungs every 6 (six) hours as needed.      Marland Kitchen AMLODIPINE BESYLATE 10 MG PO TABS Oral Take 10 mg by mouth daily.      . ARIPIPRAZOLE 20 MG PO TABS Oral Take 20 mg by mouth daily.      . DULOXETINE HCL 60 MG PO CPEP Oral Take 60 mg by mouth daily.      . ETODOLAC 400 MG PO TABS Oral Take 400 mg by mouth daily.      . FENOFIBRATE MICRONIZED 134 MG PO CAPS Oral Take 134 mg by mouth daily before breakfast.      . HYDROCHLOROTHIAZIDE 25 MG PO TABS Oral Take 25 mg by mouth daily.      Marland Kitchen  HYDROCODONE-ACETAMINOPHEN 5-500 MG PO CAPS Oral Take 1 capsule by mouth every 6 (six) hours as needed.      Marland Kitchen METFORMIN HCL 1000 MG PO TABS Oral Take 1,000 mg by mouth 2 (two) times daily with a meal.      . METOPROLOL SUCCINATE ER 100 MG PO TB24 Oral Take 100 mg by mouth daily.      Marland Kitchen NIACIN ER (ANTIHYPERLIPIDEMIC) 1000 MG PO TBCR Oral Take 1,000 mg by mouth at bedtime.      Marland Kitchen PANTOPRAZOLE SODIUM 40 MG PO TBEC Oral Take 40 mg by mouth daily. Take 30-60 minutes before the first meal of the day    . PIOGLITAZONE HCL 30 MG PO TABS Oral Take 30 mg by mouth daily.      Marland Kitchen ROSUVASTATIN CALCIUM 20 MG PO TABS Oral Take 20 mg by mouth daily.      Marland Kitchen VITAMIN D (ERGOCALCIFEROL) 50000 UNITS PO CAPS Oral Take 50,000 Units by mouth 2 (two) times a week. Mondays, Wednesday    . ZOLPIDEM TARTRATE 10 MG PO TABS Oral Take 10 mg by mouth at bedtime as needed.      Marland Kitchen NITROGLYCERIN 0.1 MG/HR TD PT24 Transdermal Place 1 patch onto the skin daily as needed.      Marland Kitchen PANTOPRAZOLE SODIUM 40 MG PO TBEC      . VALSARTAN-HYDROCHLOROTHIAZIDE 160-25 MG PO TABS Oral Take 1 tablet by mouth daily. 30 tablet 11    BP 160/75  Pulse 65  Temp(Src) 97.8 F (36.6 C) (Oral)  Resp 16  SpO2 94%  Physical Exam  Nursing note and vitals reviewed. Constitutional: She is oriented to person, place, and time. She appears well-developed and well-nourished. Distressed: in moderate distress with right-sided chest pain.  HENT:  Head: Normocephalic and atraumatic.  Right Ear: External ear normal.  Left Ear: External ear normal.  Eyes: Conjunctivae and EOM are normal. Pupils are equal, round, and reactive to light.  Neck: Normal range of motion. Neck supple. No JVD present. No thyromegaly present.  Cardiovascular: Normal rate, regular rhythm and normal heart sounds.   Pulmonary/Chest: Effort normal and breath sounds normal.  Abdominal: Soft. Bowel sounds are normal.  Musculoskeletal: Normal range of motion. She exhibits no edema and no  tenderness.  Neurological: She is alert and oriented to person, place, and time.       Sensory or motor deficit  Skin: Skin is warm and dry.  Psychiatric: She has a normal mood and affect. Her behavior is normal.    ED Course  Procedures (including critical care time)  Labs Reviewed  COMPREHENSIVE METABOLIC PANEL - Abnormal; Notable for the following:    Potassium 3.2 (*)    Glucose, Bld 142 (*)  All other components within normal limits  CBC  DIFFERENTIAL  TROPONIN I   10:33 PM Pt with history of thoracic aortic aneurysm.  She has had this for 2 years, and was advised to be checked if she had chest pain.  Workup ordered.  1:32 AM Results for orders placed during the hospital encounter of 02/20/12  CBC      Component Value Range   WBC 6.4  4.0 - 10.5 (K/uL)   RBC 4.74  3.87 - 5.11 (MIL/uL)   Hemoglobin 13.4  12.0 - 15.0 (g/dL)   HCT 40.5  36.0 - 46.0 (%)   MCV 85.4  78.0 - 100.0 (fL)   MCH 28.3  26.0 - 34.0 (pg)   MCHC 33.1  30.0 - 36.0 (g/dL)   RDW 14.4  11.5 - 15.5 (%)   Platelets 218  150 - 400 (K/uL)  DIFFERENTIAL      Component Value Range   Neutrophils Relative 67  43 - 77 (%)   Neutro Abs 4.3  1.7 - 7.7 (K/uL)   Lymphocytes Relative 25  12 - 46 (%)   Lymphs Abs 1.6  0.7 - 4.0 (K/uL)   Monocytes Relative 6  3 - 12 (%)   Monocytes Absolute 0.4  0.1 - 1.0 (K/uL)   Eosinophils Relative 3  0 - 5 (%)   Eosinophils Absolute 0.2  0.0 - 0.7 (K/uL)   Basophils Relative 0  0 - 1 (%)   Basophils Absolute 0.0  0.0 - 0.1 (K/uL)  COMPREHENSIVE METABOLIC PANEL      Component Value Range   Sodium 141  135 - 145 (mEq/L)   Potassium 3.2 (*) 3.5 - 5.1 (mEq/L)   Chloride 101  96 - 112 (mEq/L)   CO2 27  19 - 32 (mEq/L)   Glucose, Bld 142 (*) 70 - 99 (mg/dL)   BUN 9  6 - 23 (mg/dL)   Creatinine, Ser 0.63  0.50 - 1.10 (mg/dL)   Calcium 10.0  8.4 - 10.5 (mg/dL)   Total Protein 7.5  6.0 - 8.3 (g/dL)   Albumin 3.8  3.5 - 5.2 (g/dL)   AST 19  0 - 37 (U/L)   ALT 15  0 - 35  (U/L)   Alkaline Phosphatase 52  39 - 117 (U/L)   Total Bilirubin 0.3  0.3 - 1.2 (mg/dL)   GFR calc non Af Amer >90  >90 (mL/min)   GFR calc Af Amer >90  >90 (mL/min)  TROPONIN I      Component Value Range   Troponin I <0.30  <0.30 (ng/mL)  CBC      Component Value Range   WBC 7.5  4.0 - 10.5 (K/uL)   RBC 4.69  3.87 - 5.11 (MIL/uL)   Hemoglobin 13.4  12.0 - 15.0 (g/dL)   HCT 40.0  36.0 - 46.0 (%)   MCV 85.3  78.0 - 100.0 (fL)   MCH 28.6  26.0 - 34.0 (pg)   MCHC 33.5  30.0 - 36.0 (g/dL)   RDW 14.4  11.5 - 15.5 (%)   Platelets 221  150 - 400 (K/uL)  DIFFERENTIAL      Component Value Range   Neutrophils Relative 72  43 - 77 (%)   Neutro Abs 5.4  1.7 - 7.7 (K/uL)   Lymphocytes Relative 18  12 - 46 (%)   Lymphs Abs 1.4  0.7 - 4.0 (K/uL)   Monocytes Relative 8  3 - 12 (%)   Monocytes Absolute 0.6  0.1 - 1.0 (K/uL)   Eosinophils Relative 2  0 - 5 (%)   Eosinophils Absolute 0.2  0.0 - 0.7 (K/uL)   Basophils Relative 0  0 - 1 (%)   Basophils Absolute 0.0  0.0 - 0.1 (K/uL)  COMPREHENSIVE METABOLIC PANEL      Component Value Range   Sodium 140  135 - 145 (mEq/L)   Potassium 3.4 (*) 3.5 - 5.1 (mEq/L)   Chloride 101  96 - 112 (mEq/L)   CO2 27  19 - 32 (mEq/L)   Glucose, Bld 151 (*) 70 - 99 (mg/dL)   BUN 9  6 - 23 (mg/dL)   Creatinine, Ser 0.60  0.50 - 1.10 (mg/dL)   Calcium 9.8  8.4 - 10.5 (mg/dL)   Total Protein 7.3  6.0 - 8.3 (g/dL)   Albumin 3.8  3.5 - 5.2 (g/dL)   AST 17  0 - 37 (U/L)   ALT 14  0 - 35 (U/L)   Alkaline Phosphatase 54  39 - 117 (U/L)   Total Bilirubin 0.3  0.3 - 1.2 (mg/dL)   GFR calc non Af Amer >90  >90 (mL/min)   GFR calc Af Amer >90  >90 (mL/min)  URINALYSIS, ROUTINE W REFLEX MICROSCOPIC      Component Value Range   Color, Urine YELLOW  YELLOW    APPearance CLEAR  CLEAR    Specific Gravity, Urine 1.014  1.005 - 1.030    pH 7.5  5.0 - 8.0    Glucose, UA NEGATIVE  NEGATIVE (mg/dL)   Hgb urine dipstick NEGATIVE  NEGATIVE    Bilirubin Urine SMALL (*)  NEGATIVE    Ketones, ur NEGATIVE  NEGATIVE (mg/dL)   Protein, ur NEGATIVE  NEGATIVE (mg/dL)   Urobilinogen, UA 1.0  0.0 - 1.0 (mg/dL)   Nitrite NEGATIVE  NEGATIVE    Leukocytes, UA TRACE (*) NEGATIVE   D-DIMER, QUANTITATIVE      Component Value Range   D-Dimer, Quant 0.41  0.00 - 0.48 (ug/mL-FEU)  PROTIME-INR      Component Value Range   Prothrombin Time 12.8  11.6 - 15.2 (seconds)   INR 0.94  0.00 - 1.49   APTT      Component Value Range   aPTT 30  24 - 37 (seconds)  SAMPLE TO BLOOD BANK      Component Value Range   Blood Bank Specimen SAMPLE AVAILABLE FOR TESTING     Sample Expiration 02/21/2012    POCT I-STAT TROPONIN I      Component Value Range   Troponin i, poc 0.00  0.00 - 0.08 (ng/mL)   Comment 3           URINE MICROSCOPIC-ADD ON      Component Value Range   Squamous Epithelial / LPF RARE  RARE    WBC, UA 3-6  <3 (WBC/hpf)   RBC / HPF 0-2  <3 (RBC/hpf)   Bacteria, UA RARE  RARE    Casts HYALINE CASTS (*) NEGATIVE    Dg Chest 2 View  02/20/2012  *RADIOLOGY REPORT*  Clinical Data: Right upper abdominal pain.  CHEST - 2 VIEW  Comparison: 08/18/2011 CT  Findings: Prominent cardiomediastinal contours are similar to prior mild cardiomegaly and dilatation of the ascending aorta.  Mild interstitial prominence.  No focal consolidation.  No pleural effusion or pneumothorax.  No acute osseous finding.  Surgical clips right upper quadrant.  IMPRESSION: Prominent cardiomediastinal contours are similar to prior.  Mild interstitial prominence without focal  consolidation.  Original Report Authenticated By: Suanne Marker, M.D.   Ct Angio Chest W/cm &/or Wo Cm  02/21/2012  *RADIOLOGY REPORT*  Clinical Data: Right-sided chest pain  CT ANGIOGRAPHY CHEST  Technique:  Multidetector CT imaging of the chest using the standard protocol during bolus administration of intravenous contrast. Multiplanar reconstructed images including MIPs were obtained and reviewed to evaluate the vascular  anatomy.  Contrast: 138mL OMNIPAQUE IOHEXOL 350 MG/ML SOLN  Comparison: 02/20/2012 radiograph, 08/18/2011 CT  Findings: There is dilatation of the main pulmonary artery up to 3.9 cm.  No pulmonary arterial branch filling defect identified. Dilatation of the ascending aorta, measuring up to 3.7 cm.  Tapers to a normal caliber of the arch.  There is scattered atherosclerotic calcification of the aorta and branch vessels. No dissection.  The heart is mildly enlarged.  No pleural or pericardial effusion.  No intrathoracic lymphadenopathy.  Limited images through the upper abdomen demonstrate a non megaly. Status post cholecystectomy.  Central airways are patent.  Mild linear opacity along the right fissure.  Mild dependent atelectasis.  No pneumothorax.  No acute osseous finding.  IMPRESSION: No pulmonary embolism identified.  Prominent main pulmonary can be seen with pulmonary arterial hypertension.  Dilatation of the ascending aorta is similar to prior.  Minimal dependent atelectasis.  Otherwise, no acute intrathoracic process identified.  Original Report Authenticated By: Suanne Marker, M.D.     1:33 AM CT angio of chest showed no change in her ascending aortic aneurysm, did show evidence of pulmonary hypertension.  TNI negative and EKG benign.  She continues to complain of chest pain, so [facility] called to admit her for chest pain observation.  1:47 AM Case discussed with Dr. Shanon Brow, who advised that pt should be given nitroglycerin.  She will be down to see pt and will decide what floor at that time.  1. Chest pain   2. Thoracic aortic aneurysm         Mylinda Latina III, MD 02/22/12 [phone]

## 2012-02-21 ENCOUNTER — Emergency Department (HOSPITAL_COMMUNITY): Payer: Medicare Other

## 2012-02-21 ENCOUNTER — Encounter (HOSPITAL_COMMUNITY): Payer: Self-pay | Admitting: Radiology

## 2012-02-21 DIAGNOSIS — I7121 Aneurysm of the ascending aorta, without rupture: Secondary | ICD-10-CM | POA: Diagnosis present

## 2012-02-21 DIAGNOSIS — I712 Thoracic aortic aneurysm, without rupture: Secondary | ICD-10-CM | POA: Diagnosis present

## 2012-02-21 DIAGNOSIS — I714 Abdominal aortic aneurysm, without rupture, unspecified: Secondary | ICD-10-CM

## 2012-02-21 DIAGNOSIS — J45909 Unspecified asthma, uncomplicated: Secondary | ICD-10-CM | POA: Diagnosis present

## 2012-02-21 DIAGNOSIS — J9610 Chronic respiratory failure, unspecified whether with hypoxia or hypercapnia: Secondary | ICD-10-CM

## 2012-02-21 DIAGNOSIS — R079 Chest pain, unspecified: Secondary | ICD-10-CM | POA: Diagnosis present

## 2012-02-21 LAB — D-DIMER, QUANTITATIVE: D-Dimer, Quant: 0.41 ug/mL-FEU (ref 0.00–0.48)

## 2012-02-21 LAB — URINALYSIS, ROUTINE W REFLEX MICROSCOPIC
Hgb urine dipstick: NEGATIVE
Nitrite: NEGATIVE
Specific Gravity, Urine: 1.014 (ref 1.005–1.030)
pH: 7.5 (ref 5.0–8.0)

## 2012-02-21 LAB — BASIC METABOLIC PANEL
BUN: 8 mg/dL (ref 6–23)
Chloride: 100 mEq/L (ref 96–112)
GFR calc Af Amer: >90 mL/min (ref >90)
Potassium: 4 mEq/L (ref 3.5–5.1)

## 2012-02-21 LAB — CBC
HCT: 36.9 % (ref 36.0–46.0)
Hemoglobin: 12 g/dL (ref 12.0–15.0)
WBC: 6.7 10*3/uL (ref 4.0–10.5)

## 2012-02-21 LAB — URINE MICROSCOPIC-ADD ON

## 2012-02-21 LAB — CARDIAC PANEL(CRET KIN+CKTOT+MB+TROPI)
Relative Index: INVALID (ref 0.0–2.5)
Troponin I: <0.3 ng/mL (ref <0.30)
Troponin I: <0.3 ng/mL (ref <0.30)
Troponin I: <0.3 ng/mL (ref <0.30)

## 2012-02-21 MED ORDER — PIOGLITAZONE HCL 30 MG PO TABS
30.0000 mg | ORAL_TABLET | Freq: Every day | ORAL | Status: DC
  Administered 2012-02-21 – 2012-02-22 (×2): 30 mg via ORAL
  Filled 2012-02-21 (×2): qty 1

## 2012-02-21 MED ORDER — ETODOLAC 400 MG PO TABS
400.0000 mg | ORAL_TABLET | Freq: Every day | ORAL | Status: DC
  Administered 2012-02-21 – 2012-02-22 (×2): 400 mg via ORAL
  Filled 2012-02-21 (×2): qty 1

## 2012-02-21 MED ORDER — NITROGLYCERIN 0.1 MG/HR TD PT24
0.1000 mg | MEDICATED_PATCH | Freq: Every day | TRANSDERMAL | Status: DC
  Administered 2012-02-21 – 2012-02-22 (×2): 0.1 mg via TRANSDERMAL
  Filled 2012-02-21 (×2): qty 1

## 2012-02-21 MED ORDER — MORPHINE SULFATE 2 MG/ML IJ SOLN
1.0000 mg | INTRAMUSCULAR | Status: DC | PRN
  Administered 2012-02-21 – 2012-02-22 (×7): 1 mg via INTRAVENOUS
  Filled 2012-02-21 (×7): qty 1

## 2012-02-21 MED ORDER — PANTOPRAZOLE SODIUM 40 MG PO TBEC
40.0000 mg | DELAYED_RELEASE_TABLET | Freq: Every day | ORAL | Status: DC
  Administered 2012-02-21 – 2012-02-22 (×2): 40 mg via ORAL
  Filled 2012-02-21 (×2): qty 1

## 2012-02-21 MED ORDER — METOPROLOL SUCCINATE ER 100 MG PO TB24
100.0000 mg | ORAL_TABLET | Freq: Every day | ORAL | Status: DC
  Administered 2012-02-21 – 2012-02-22 (×2): 100 mg via ORAL
  Filled 2012-02-21 (×3): qty 1

## 2012-02-21 MED ORDER — SODIUM CHLORIDE 0.9 % IJ SOLN
3.0000 mL | Freq: Two times a day (BID) | INTRAMUSCULAR | Status: DC
  Administered 2012-02-21 – 2012-02-22 (×3): 3 mL via INTRAVENOUS

## 2012-02-21 MED ORDER — IOHEXOL 350 MG/ML SOLN
100.0000 mL | Freq: Once | INTRAVENOUS | Status: AC | PRN
  Administered 2012-02-21: 100 mL via INTRAVENOUS

## 2012-02-21 MED ORDER — ATORVASTATIN CALCIUM 10 MG PO TABS
10.0000 mg | ORAL_TABLET | Freq: Every day | ORAL | Status: DC
  Administered 2012-02-21 – 2012-02-22 (×2): 10 mg via ORAL
  Filled 2012-02-21 (×2): qty 1

## 2012-02-21 MED ORDER — ONDANSETRON HCL 4 MG/2ML IJ SOLN
4.0000 mg | Freq: Once | INTRAMUSCULAR | Status: AC
  Administered 2012-02-21: 4 mg via INTRAVENOUS
  Filled 2012-02-21: qty 2

## 2012-02-21 MED ORDER — HYDROCHLOROTHIAZIDE 25 MG PO TABS
25.0000 mg | ORAL_TABLET | Freq: Every day | ORAL | Status: DC
  Administered 2012-02-21 – 2012-02-22 (×2): 25 mg via ORAL
  Filled 2012-02-21 (×3): qty 1

## 2012-02-21 MED ORDER — DULOXETINE HCL 60 MG PO CPEP
60.0000 mg | ORAL_CAPSULE | Freq: Every day | ORAL | Status: DC
  Administered 2012-02-21 – 2012-02-22 (×2): 60 mg via ORAL
  Filled 2012-02-21 (×2): qty 1

## 2012-02-21 MED ORDER — ARIPIPRAZOLE 10 MG PO TABS
20.0000 mg | ORAL_TABLET | Freq: Every day | ORAL | Status: DC
  Administered 2012-02-21 – 2012-02-22 (×2): 20 mg via ORAL
  Filled 2012-02-21 (×2): qty 2

## 2012-02-21 MED ORDER — SODIUM CHLORIDE 0.9 % IJ SOLN: 3.0000 mL | INTRAMUSCULAR | Status: DC | PRN

## 2012-02-21 MED ORDER — SODIUM CHLORIDE 0.9 % IV SOLN: 250.0000 mL | INTRAVENOUS | Status: DC | PRN

## 2012-02-21 MED ORDER — AMLODIPINE BESYLATE 10 MG PO TABS
10.0000 mg | ORAL_TABLET | Freq: Every day | ORAL | Status: DC
  Administered 2012-02-21 – 2012-02-22 (×2): 10 mg via ORAL
  Filled 2012-02-21 (×2): qty 1

## 2012-02-21 MED ORDER — NITROGLYCERIN 0.4 MG SL SUBL
0.4000 mg | SUBLINGUAL_TABLET | SUBLINGUAL | Status: DC | PRN
  Filled 2012-02-21: qty 25

## 2012-02-21 MED ORDER — MORPHINE SULFATE 4 MG/ML IJ SOLN
4.0000 mg | Freq: Once | INTRAMUSCULAR | Status: AC
  Administered 2012-02-21: 4 mg via INTRAVENOUS
  Filled 2012-02-21: qty 1

## 2012-02-21 MED ORDER — SODIUM CHLORIDE 0.9 % IJ SOLN
3.0000 mL | Freq: Two times a day (BID) | INTRAMUSCULAR | Status: DC
  Administered 2012-02-21 – 2012-02-22 (×4): 3 mL via INTRAVENOUS

## 2012-02-21 NOTE — Progress Notes (Signed)
Utilization review complete 

## 2012-02-21 NOTE — H&P (Signed)
Chief Complaint:  Chest pain  HPI: 61 year old female who has been having right sided chest pain underneath her right breast that has been going on for about 24 hours now that is currently been resolved with morphine and no association with shortness of breath, fevers, cough, nausea, vomiting or abdominal pain. She denies any rashes underneath her breasts. The pain is reproducible when she presses on it. She denies any pain with deep inspiration. She has a history of AAA and was concerned that her aneurysm was causing the pain. She has been recently placed on home oxygen by pulmonologist but unclear reason why. She has never had a sleep study before in the past.  Review of Systems:  Otherwise negative  Past Medical History: Past Medical History  Diagnosis Date  . CAD (coronary artery disease)   . Diabetes mellitus   . Hypertension   . Hyperlipidemia    Past Surgical History  Procedure Date  . Cholecystectomy   . Tubal ligation     Medications: Prior to Admission medications   Medication Sig Start Date End Date Taking? Authorizing Provider  albuterol (PROAIR HFA) 108 (90 BASE) MCG/ACT inhaler Inhale 2 puffs into the lungs every 6 (six) hours as needed.     Yes Historical Provider, MD  amLODipine (NORVASC) 10 MG tablet Take 10 mg by mouth daily.     Yes Historical Provider, MD  ARIPiprazole (ABILIFY) 20 MG tablet Take 20 mg by mouth daily.     Yes Historical Provider, MD  DULoxetine (CYMBALTA) 60 MG capsule Take 60 mg by mouth daily.     Yes Historical Provider, MD  etodolac (LODINE) 400 MG tablet Take 400 mg by mouth daily.     Yes Historical Provider, MD  fenofibrate micronized (LOFIBRA) 134 MG capsule Take 134 mg by mouth daily before breakfast.     Yes Historical Provider, MD  hydrochlorothiazide 25 MG tablet Take 25 mg by mouth daily.     Yes Historical Provider, MD  hydrocodone-acetaminophen (LORCET-HD) 5-500 MG per capsule Take 1 capsule by mouth every 6 (six) hours as needed.      Yes Historical Provider, MD  metFORMIN (GLUCOPHAGE) 1000 MG tablet Take 1,000 mg by mouth 2 (two) times daily with a meal.     Yes Historical Provider, MD  metoprolol (TOPROL-XL) 100 MG 24 hr tablet Take 100 mg by mouth daily.     Yes Historical Provider, MD  niacin (NIASPAN) 1000 MG CR tablet Take 1,000 mg by mouth at bedtime.     Yes Historical Provider, MD  pantoprazole (PROTONIX) 40 MG tablet Take 40 mg by mouth daily. Take 30-60 minutes before the first meal of the day   Yes Historical Provider, MD  pioglitazone (ACTOS) 30 MG tablet Take 30 mg by mouth daily.     Yes Historical Provider, MD  rosuvastatin (CRESTOR) 20 MG tablet Take 20 mg by mouth daily.     Yes Historical Provider, MD  Vitamin D, Ergocalciferol, (DRISDOL) 50000 UNITS CAPS Take 50,000 Units by mouth 2 (two) times a week. Mondays, Wednesday   Yes Historical Provider, MD  zolpidem (AMBIEN) 10 MG tablet Take 10 mg by mouth at bedtime as needed.     Yes Historical Provider, MD  nitroGLYCERIN (NITRODUR - DOSED IN MG/24 HR) 0.1 mg/hr Place 1 patch onto the skin daily as needed.      Historical Provider, MD  pantoprazole (PROTONIX) 40 MG tablet  11/12/11 02/20/12  Tanda Rockers, MD  valsartan-hydrochlorothiazide (DIOVAN HCT) [phone]  MG per tablet Take 1 tablet by mouth daily. 02/16/11 02/16/12  Tanda Rockers, MD    Allergies:   Allergies  Allergen Reactions  . Codeine Nausea Only  . Amoxicillin Rash    Social History:  reports that she has never smoked. She has never used smokeless tobacco. She reports that she drinks alcohol. She reports that she does not use illicit drugs.  Family History: Family History  Problem Relation Age of Onset  . Asthma Mother   . Allergies Mother   . Allergies Maternal Grandmother   . Heart disease Maternal Grandfather   . Heart disease Maternal Grandmother   . Heart disease Paternal Grandfather   . Heart disease Paternal Grandmother   . Heart disease Mother   . Breast cancer Maternal  Grandmother     Physical Exam: Filed Vitals:   02/20/12 2215 02/20/12 2230 02/20/12 2245 02/20/12 2345  BP:      Pulse: 66 65 63 64  Temp:      TempSrc:      Resp: 19 18 14 20   SpO2: 94% 96% 93% 95%   General appearance: alert, cooperative and no distress Lungs: clear to auscultation bilaterally Heart: regular rate and rhythm, S1, S2 normal, no murmur, click, rub or gallop reproducible chest pain to palpation under the right breast Abdomen: soft, non-tender; bowel sounds normal; no masses,  no organomegaly Extremities: extremities normal, atraumatic, no cyanosis or edema Pulses: 2+ and symmetric Skin: Skin color, texture, turgor normal. No rashes or lesions Neurologic: Grossly normal    Labs on Admission:   [facility] 02/20/12 2242 02/20/12 2107  NA 140 141  K 3.4* 3.2*  CL 101 101  CO2 27 27  GLUCOSE 151* 142*  BUN 9 9  CREATININE 0.60 0.63  CALCIUM 9.8 10.0  MG -- --  PHOS -- --    Basename 02/20/12 2242 02/20/12 2107  AST 17 19  ALT 14 15  ALKPHOS 54 52  BILITOT 0.3 0.3  PROT 7.3 7.5  ALBUMIN 3.8 3.8    Basename 02/20/12 2242 02/20/12 2107  WBC 7.5 6.4  NEUTROABS 5.4 4.3  HGB 13.4 13.4  HCT 40.0 40.5  MCV 85.3 85.4  PLT 221 218    Basename 02/20/12 2108  CKTOTAL --  CKMB --  CKMBINDEX --  TROPONINI <0.30    Radiological Exams on Admission: Dg Chest 2 View  02/20/2012  *RADIOLOGY REPORT*  Clinical Data: Right upper abdominal pain.  CHEST - 2 VIEW  Comparison: 08/18/2011 CT  Findings: Prominent cardiomediastinal contours are similar to prior mild cardiomegaly and dilatation of the ascending aorta.  Mild interstitial prominence.  No focal consolidation.  No pleural effusion or pneumothorax.  No acute osseous finding.  Surgical clips right upper quadrant.  IMPRESSION: Prominent cardiomediastinal contours are similar to prior.  Mild interstitial prominence without focal consolidation.  Original Report Authenticated By: Suanne Marker, M.D.   Ct Angio  Chest W/cm &/or Wo Cm  02/21/2012  *RADIOLOGY REPORT*  Clinical Data: Right-sided chest pain  CT ANGIOGRAPHY CHEST  Technique:  Multidetector CT imaging of the chest using the standard protocol during bolus administration of intravenous contrast. Multiplanar reconstructed images including MIPs were obtained and reviewed to evaluate the vascular anatomy.  Contrast: 181mL OMNIPAQUE IOHEXOL 350 MG/ML SOLN  Comparison: 02/20/2012 radiograph, 08/18/2011 CT  Findings: There is dilatation of the main pulmonary artery up to 3.9 cm.  No pulmonary arterial branch filling defect identified. Dilatation of the ascending aorta, measuring up to 3.7  cm.  Tapers to a normal caliber of the arch.  There is scattered atherosclerotic calcification of the aorta and branch vessels. No dissection.  The heart is mildly enlarged.  No pleural or pericardial effusion.  No intrathoracic lymphadenopathy.  Limited images through the upper abdomen demonstrate a non megaly. Status post cholecystectomy.  Central airways are patent.  Mild linear opacity along the right fissure.  Mild dependent atelectasis.  No pneumothorax.  No acute osseous finding.  IMPRESSION: No pulmonary embolism identified.  Prominent main pulmonary can be seen with pulmonary arterial hypertension.  Dilatation of the ascending aorta is similar to prior.  Minimal dependent atelectasis.  Otherwise, no acute intrathoracic process identified.  Original Report Authenticated By: Suanne Marker, M.D.    Assessment/Plan Present on Admission:  61 year old female with atypical chest pain  .Chest pain .AAA (abdominal aortic aneurysm) .Hypertension .Chronic respiratory failure .Morbid obesity  Obtain serial cardiac enzymes and place on telemetry. Obtain 2-D echo. Patient pain is reproducible. CT of today does not show any enlargement of her aneurysm which is reassuring. She is status post cholecystectomy with normal LFTs and lipase. She'll probably benefit from a further  outpatient sleep testing for her other chronic problems. Further recommendations depending on the results of the above.  Matias Thurman A [id number] 02/21/2012, 1:57 AM

## 2012-02-21 NOTE — Progress Notes (Signed)
Echocardiogram 2D Echocardiogram has been performed.  Kara Nunez [facility] 02/21/2012, 4:03 PM

## 2012-02-21 NOTE — Progress Notes (Signed)
Subjective: No SOB, no abdominal pain and no fever. Still complaining of Right sided CP; but endorses to be improved.  Objective: Vital signs in last 24 hours: Temp:  [97.8 F (36.6 C)-98.8 F (37.1 C)] 98.8 F (37.1 C) (06/10 0343) Pulse Rate:  [62-76] 69  (06/10 0343) Resp:  [14-20] 16  (06/10 0343) BP: (122-177)/(53-75) 132/70 mmHg (06/10 0343) SpO2:  [93 %-97 %] 94 % (06/10 0343) Weight change:  Last BM Date: 02/20/12  Intake/Output from previous day:       Physical Exam: General: Alert, awake, oriented x3, in no acute distress. HEENT: No bruits, no goiter. Heart: Regular rate and rhythm, without murmurs, rubs, gallops. Lungs: Clear to auscultation bilaterally. Abdomen: Soft, nontender, nondistended, positive bowel sounds. Extremities: No clubbing or cyanosis; trace edema bilaterally. Neuro: Grossly intact, nonfocal.   Lab Results: Basic Metabolic Panel:  Basename 02/21/12 0432 02/20/12 2242  NA 138 140  K 4.0 3.4*  CL 100 101  CO2 29 27  GLUCOSE 133* 151*  BUN 8 9  CREATININE 0.55 0.60  CALCIUM 9.4 9.8  MG -- --  PHOS -- --   Liver Function Tests:  [facility] 02/20/12 2242 02/20/12 2107  AST 17 19  ALT 14 15  ALKPHOS 54 52  BILITOT 0.3 0.3  PROT 7.3 7.5  ALBUMIN 3.8 3.8   CBC:  Basename 02/21/12 0432 02/20/12 2242 02/20/12 2107  WBC 6.7 7.5 --  NEUTROABS -- 5.4 4.3  HGB 12.0 13.4 --  HCT 36.9 40.0 --  MCV 85.8 85.3 --  PLT 193 221 --   Cardiac Enzymes:  Basename 02/21/12 0432 02/20/12 2108  CKTOTAL 35 --  CKMB 1.4 --  CKMBINDEX -- --  TROPONINI <0.30 <0.30   D-Dimer:  Basename 02/20/12 2242  DDIMER 0.41   Coagulation:  Basename 02/20/12 2242  LABPROT 12.8  INR 0.94   Urinalysis:  Basename 02/20/12 2340  COLORURINE YELLOW  LABSPEC 1.014  PHURINE 7.5  GLUCOSEU NEGATIVE  HGBUR NEGATIVE  BILIRUBINUR SMALL*  KETONESUR NEGATIVE  PROTEINUR NEGATIVE  UROBILINOGEN 1.0  NITRITE NEGATIVE  LEUKOCYTESUR TRACE*     Studies/Results: Dg Chest 2 View  02/20/2012  *RADIOLOGY REPORT*  Clinical Data: Right upper abdominal pain.  CHEST - 2 VIEW  Comparison: 08/18/2011 CT  Findings: Prominent cardiomediastinal contours are similar to prior mild cardiomegaly and dilatation of the ascending aorta.  Mild interstitial prominence.  No focal consolidation.  No pleural effusion or pneumothorax.  No acute osseous finding.  Surgical clips right upper quadrant.  IMPRESSION: Prominent cardiomediastinal contours are similar to prior.  Mild interstitial prominence without focal consolidation.  Original Report Authenticated By: Suanne Marker, M.D.   Ct Angio Chest W/cm &/or Wo Cm  02/21/2012  *RADIOLOGY REPORT*  Clinical Data: Right-sided chest pain  CT ANGIOGRAPHY CHEST  Technique:  Multidetector CT imaging of the chest using the standard protocol during bolus administration of intravenous contrast. Multiplanar reconstructed images including MIPs were obtained and reviewed to evaluate the vascular anatomy.  Contrast: 166mL OMNIPAQUE IOHEXOL 350 MG/ML SOLN  Comparison: 02/20/2012 radiograph, 08/18/2011 CT  Findings: There is dilatation of the main pulmonary artery up to 3.9 cm.  No pulmonary arterial branch filling defect identified. Dilatation of the ascending aorta, measuring up to 3.7 cm.  Tapers to a normal caliber of the arch.  There is scattered atherosclerotic calcification of the aorta and branch vessels. No dissection.  The heart is mildly enlarged.  No pleural or pericardial effusion.  No intrathoracic lymphadenopathy.  Limited images  through the upper abdomen demonstrate a non megaly. Status post cholecystectomy.  Central airways are patent.  Mild linear opacity along the right fissure.  Mild dependent atelectasis.  No pneumothorax.  No acute osseous finding.  IMPRESSION: No pulmonary embolism identified.  Prominent main pulmonary can be seen with pulmonary arterial hypertension.  Dilatation of the ascending aorta is  similar to prior.  Minimal dependent atelectasis.  Otherwise, no acute intrathoracic process identified.  Original Report Authenticated By: Suanne Marker, M.D.    Medications: Scheduled Meds:   . amLODipine  10 mg Oral Daily  . ARIPiprazole  20 mg Oral Daily  . atorvastatin  10 mg Oral q1800  . DULoxetine  60 mg Oral Daily  . etodolac  400 mg Oral Daily  . hydrochlorothiazide  25 mg Oral Daily  . metoprolol succinate  100 mg Oral Daily  .  morphine injection  4 mg Intravenous Once  .  morphine injection  4 mg Intravenous Once  . nitroGLYCERIN  0.1 mg Transdermal Daily  . ondansetron (ZOFRAN) IV  4 mg Intravenous Once  . ondansetron (ZOFRAN) IV  4 mg Intravenous Once  . pantoprazole  40 mg Oral Daily  . pioglitazone  30 mg Oral Daily  . sodium chloride  3 mL Intravenous Q12H  . sodium chloride  3 mL Intravenous Q12H   Continuous Infusions:   . DISCONTD: sodium chloride 125 mL/hr at 02/20/12 2342   PRN Meds:.sodium chloride, iohexol, morphine injection, nitroGLYCERIN, sodium chloride  Assessment/Plan: 1-Chest pain: appears to be MSK in origin; CTA negative for PE; CXR w/o findings of acute cardiopulmonary process and so far EKG and cardiac enzymes r/o MI. Will treating pain and will follow 2-D echo  2-Hypertension: well controlled. Continue current regimen.  3-Morbid obesity: patient advised to lose weight and to follow a low calorie diet.  4-Chronic respiratory failure: continue home oxygen and follow up with pulmonologist to arrange sleep study as an outpatient. Most with obesity hypoventilation syndrome as a component as well for her SOB.  5-AAA (abdominal aortic aneurysm): stable.  6-GERD: continue PPI.  7-Depression: continue abilify and duloxetine.  8-DM: continue pioglitazone.  9-Presumed pulmonary HTN: will follow 2-D echo and will recommend sleep study as an outpatient.   LOS: 1 day   Niyah Mamaril [facility] [phone]  02/21/2012, 9:57  AM

## 2012-02-22 DIAGNOSIS — J9610 Chronic respiratory failure, unspecified whether with hypoxia or hypercapnia: Secondary | ICD-10-CM

## 2012-02-22 DIAGNOSIS — R079 Chest pain, unspecified: Secondary | ICD-10-CM

## 2012-02-22 DIAGNOSIS — I714 Abdominal aortic aneurysm, without rupture, unspecified: Secondary | ICD-10-CM

## 2012-02-22 MED ORDER — ETODOLAC 400 MG PO TABS: 400.0000 mg | ORAL_TABLET | Freq: Two times a day (BID) | ORAL | Status: AC

## 2012-02-22 MED ORDER — VALSARTAN 160 MG PO TABS: 160.0000 mg | ORAL_TABLET | Freq: Every day | ORAL | Status: DC

## 2012-02-22 MED ORDER — PANTOPRAZOLE SODIUM 40 MG PO TBEC: 40.0000 mg | DELAYED_RELEASE_TABLET | Freq: Two times a day (BID) | ORAL | Status: DC

## 2012-02-22 MED ORDER — FUROSEMIDE 40 MG PO TABS: 40.0000 mg | ORAL_TABLET | Freq: Every day | ORAL | Status: DC

## 2012-02-22 MED ORDER — METFORMIN HCL 1000 MG PO TABS: 1000.0000 mg | ORAL_TABLET | Freq: Two times a day (BID) | ORAL | Status: DC

## 2012-02-22 NOTE — Progress Notes (Signed)
Pt discharge instructions and patient education complete. IV site d/c. Sites WNL. No s/s of distress. Pt had no questions. D/C home with family. Marko Plume

## 2012-02-22 NOTE — Discharge Summary (Signed)
Physician Discharge Summary  Patient ID: Kara Nunez MRN: [id number] DOB/AGE: 20-Apr-1951 61 y.o.  Admit date: 02/20/2012 Discharge date: 02/22/2012  Primary Care Physician:  Reynold Bowen, NT, Nursing/ Tech I   Discharge Diagnoses:   1-Non cardiac Chest pain 2-AAA (abdominal aortic aneurysm) 3-Hypertension 4-Chronic respiratory failure 5-Morbid obesity 6-Asthma 7-Diastolic CHF (chronic and compensated at this moment. EF 55%) 8-depression 9-pulmonary HTN 10-HLD 11-GERD   Medication List  As of 02/22/2012 10:27 AM   STOP taking these medications         hydrochlorothiazide 25 MG tablet      valsartan-hydrochlorothiazide 160-25 MG per tablet         TAKE these medications         amLODipine 10 MG tablet   Commonly known as: NORVASC   Take 10 mg by mouth daily.      ARIPiprazole 20 MG tablet   Commonly known as: ABILIFY   Take 20 mg by mouth daily.      DULoxetine 60 MG capsule   Commonly known as: CYMBALTA   Take 60 mg by mouth daily.      etodolac 400 MG tablet   Commonly known as: LODINE   Take 1 tablet (400 mg total) by mouth 2 (two) times daily. Take medication twice a day for 7 days and then use it PRN every 24 hours for pain.      fenofibrate micronized 134 MG capsule   Commonly known as: LOFIBRA   Take 134 mg by mouth daily before breakfast.      furosemide 40 MG tablet   Commonly known as: LASIX   Take 1 tablet (40 mg total) by mouth daily.      hydrocodone-acetaminophen 5-500 MG per capsule   Commonly known as: LORCET-HD   Take 1 capsule by mouth every 6 (six) hours as needed.      metFORMIN 1000 MG tablet   Commonly known as: GLUCOPHAGE   Take 1 tablet (1,000 mg total) by mouth 2 (two) times daily with a meal. Hold until 02/23/12; then resume use as previously prescribed.      metoprolol succinate 100 MG 24 hr tablet   Commonly known as: TOPROL-XL   Take 100 mg by mouth daily.      niacin 1000 MG CR tablet   Commonly known as: NIASPAN   Take 1,000 mg by mouth at bedtime.      nitroGLYCERIN 0.1 mg/hr   Commonly known as: NITRODUR - Dosed in mg/24 hr   Place 1 patch onto the skin daily as needed.      pantoprazole 40 MG tablet   Commonly known as: PROTONIX      pantoprazole 40 MG tablet   Commonly known as: PROTONIX   Take 1 tablet (40 mg total) by mouth 2 (two) times daily. Take 30-60 minutes before the first meal of the day      pioglitazone 30 MG tablet   Commonly known as: ACTOS   Take 30 mg by mouth daily.      PROAIR HFA 108 (90 BASE) MCG/ACT inhaler   Generic drug: albuterol   Inhale 2 puffs into the lungs every 6 (six) hours as needed.      rosuvastatin 20 MG tablet   Commonly known as: CRESTOR   Take 20 mg by mouth daily.      valsartan 160 MG tablet   Commonly known as: DIOVAN   Take 1 tablet (160 mg total) by mouth daily.  Vitamin D (Ergocalciferol) 50000 UNITS Caps   Commonly known as: DRISDOL   Take 50,000 Units by mouth 2 (two) times a week. Mondays, Wednesday      zolpidem 10 MG tablet   Commonly known as: AMBIEN   Take 10 mg by mouth at bedtime as needed.             Disposition and Follow-up:  Patient discharge in stable and improved condition; no ischemic changes or elevated CE'z appreciated during this admission; her 2-D echo demonstrated diastolic CHF and also mild pulmonary hypertension; no wall motion abnormalities. Patient CP though to be secondary to MSK and also GERD. Will treat with etodolac and also BID protonix. Will also recommend patient to have sleep study since her body habitus might be implicated with obesity hypoventilation syndrome and OSA. Patient will follow with PCP and pulmonary service at discharge. Was also advised to lose weight.   Consults:  None   Significant Diagnostic Studies:  Dg Chest 2 View  02/20/2012  *RADIOLOGY REPORT*  Clinical Data: Right upper abdominal pain.  CHEST - 2 VIEW  Comparison: 08/18/2011 CT  Findings: Prominent cardiomediastinal  contours are similar to prior mild cardiomegaly and dilatation of the ascending aorta.  Mild interstitial prominence.  No focal consolidation.  No pleural effusion or pneumothorax.  No acute osseous finding.  Surgical clips right upper quadrant.  IMPRESSION: Prominent cardiomediastinal contours are similar to prior.  Mild interstitial prominence without focal consolidation.  Original Report Authenticated By: Suanne Marker, M.D.   Ct Angio Chest W/cm &/or Wo Cm  02/21/2012  *RADIOLOGY REPORT*  Clinical Data: Right-sided chest pain  CT ANGIOGRAPHY CHEST  Technique:  Multidetector CT imaging of the chest using the standard protocol during bolus administration of intravenous contrast. Multiplanar reconstructed images including MIPs were obtained and reviewed to evaluate the vascular anatomy.  Contrast: 128mL OMNIPAQUE IOHEXOL 350 MG/ML SOLN  Comparison: 02/20/2012 radiograph, 08/18/2011 CT  Findings: There is dilatation of the main pulmonary artery up to 3.9 cm.  No pulmonary arterial branch filling defect identified. Dilatation of the ascending aorta, measuring up to 3.7 cm.  Tapers to a normal caliber of the arch.  There is scattered atherosclerotic calcification of the aorta and branch vessels. No dissection.  The heart is mildly enlarged.  No pleural or pericardial effusion.  No intrathoracic lymphadenopathy.  Limited images through the upper abdomen demonstrate a non megaly. Status post cholecystectomy.  Central airways are patent.  Mild linear opacity along the right fissure.  Mild dependent atelectasis.  No pneumothorax.  No acute osseous finding.  IMPRESSION: No pulmonary embolism identified.  Prominent main pulmonary can be seen with pulmonary arterial hypertension.  Dilatation of the ascending aorta is similar to prior.  Minimal dependent atelectasis.  Otherwise, no acute intrathoracic process identified.  Original Report Authenticated By: Suanne Marker, M.D.   2-D echo 02/22/12  Left  ventricle: Wall thickness was increased in a pattern of moderate LVH. There was moderate concentric hypertrophy. Systolic function was normal. The estimated ejection fraction was in the range of 55% to 65%. Features are consistent with a pseudonormal left ventricular filling pattern, with concomitant abnormal relaxation and increased filling pressure (grade 2 diastolic dysfunction). Doppler parameters are consistent with high ventricular filling pressure.    Brief H and P: For complete details please refer to admission H and P, but in brief 61 y/o female with PMH of chronic resp failure, thoracic AAA, DM, HTN, HLD and morbid obesity admitted secondary to right  side CP. Patient denies any associated diaphoresis; worsening of SOb, fevers, chills, cough, abdominal pain or any other complaints.  Hospital Course:  1-Non cardiac Chest pain: appears to be secondary to MSK cause and GERD. Will treat with PPI BID and 1 week of BID Lodine. Further evaluation and treatment by PCP during follow up.  2-AAA (abdominal aortic aneurysm): stable. Continue outpatient follow up.  3-Hypertension: stable. Will continue regimen as specified above and has recommended for low sodium diet.  4-Chronic respiratory failure: continue supplemental oxygen, weight loss and follow up with pulmonary service. Patient needs a sleep study to determine needs of CPAP vs BIPAP at night.  5-Morbid obesity: weight loss counseling and low calorie diet recommended.  6-Asthma: continue current therapy; patient no wheezing.  7-Diastolic CHF (chronic and compensated at this moment. EF 55%): compensated; heart healthy diet; lasix and BP control. Follow up with PCP for further medication adjustments.  8-depression: continue current regimen; no hallucinations or SI.  9-pulmonary HTN: as mentioned above, probably due to OSA. Has recommended outpatient sleep study and weight loss.  10-HLD: continue statins.  11-GERD: PPI increased  to BID.   Time spent on Discharge: 45 minutes  Signed: Skye Plamondon 02/22/2012, 10:27 AM

## 2012-02-22 NOTE — Progress Notes (Signed)
Pt had an episode of chest pain around 2330 last evening, concentrated in her lower right chest.  Administered 1 sublingual Nitro which relieved the pain for a short while.  EKG shows NSR with no abnormalities.  Shortly after midnight, chest pain resumed.  Administered 1 mg of morphine which then relieved the pain for a few hours.  Pt's BP has been high at times in 123456 systolic, but now is 123456.  Will continue to monitor.  Randell Patient

## 2012-07-24 ENCOUNTER — Other Ambulatory Visit: Payer: Self-pay | Admitting: Thoracic Surgery (Cardiothoracic Vascular Surgery)

## 2012-07-24 DIAGNOSIS — I712 Thoracic aortic aneurysm, without rupture: Secondary | ICD-10-CM

## 2012-07-24 DIAGNOSIS — I7121 Aneurysm of the ascending aorta, without rupture: Secondary | ICD-10-CM

## 2012-07-27 ENCOUNTER — Other Ambulatory Visit: Payer: Self-pay | Admitting: Thoracic Surgery (Cardiothoracic Vascular Surgery)

## 2012-07-27 DIAGNOSIS — I712 Thoracic aortic aneurysm, without rupture, unspecified: Secondary | ICD-10-CM

## 2012-08-22 ENCOUNTER — Encounter: Payer: Self-pay | Admitting: Thoracic Surgery (Cardiothoracic Vascular Surgery)

## 2012-08-22 ENCOUNTER — Ambulatory Visit
Admission: RE | Admit: 2012-08-22 | Discharge: 2012-08-22 | Disposition: A | Discharge status: Home / Self Care | Payer: Medicare Other | Source: Ambulatory Visit | Attending: Thoracic Surgery (Cardiothoracic Vascular Surgery) | Admitting: Thoracic Surgery (Cardiothoracic Vascular Surgery)

## 2012-08-22 ENCOUNTER — Other Ambulatory Visit: Payer: Self-pay | Admitting: Thoracic Surgery (Cardiothoracic Vascular Surgery)

## 2012-08-22 ENCOUNTER — Other Ambulatory Visit: Payer: Self-pay | Admitting: *Deleted

## 2012-08-22 ENCOUNTER — Ambulatory Visit (INDEPENDENT_AMBULATORY_CARE_PROVIDER_SITE_OTHER): Payer: Medicare Other | Admitting: Thoracic Surgery (Cardiothoracic Vascular Surgery)

## 2012-08-22 VITALS — BP 182/92 | HR 79 | Resp 20 | Ht 64.0 in | Wt 284.0 lb

## 2012-08-22 DIAGNOSIS — I712 Thoracic aortic aneurysm, without rupture, unspecified: Secondary | ICD-10-CM

## 2012-08-22 DIAGNOSIS — I7121 Aneurysm of the ascending aorta, without rupture: Secondary | ICD-10-CM

## 2012-08-22 MED ORDER — IOHEXOL 350 MG/ML SOLN
80.0000 mL | Freq: Once | INTRAVENOUS | Status: AC | PRN
  Administered 2012-08-22: 80 mL via INTRAVENOUS

## 2012-08-22 NOTE — Progress Notes (Signed)
HPI:  Kara Nunez returns today for a 1 year followup of her ascending aortic aneurysm. When she was last seen in the office a year ago her ascending aneurysm had been stable for 6 months. She was hospitalized in June and a CT that time showed no change. She now returns for her scheduled visit. She says that she has gained some weight. Her some confusion about her medications but says that her Norvasc is been stopped and is now combined with her Diovan. She has not been monitoring her blood pressure. She's not having any chest pain. She continues to use oxygen at home.  Past Medical History  Diagnosis Date  . CAD (coronary artery disease)   . Diabetes mellitus   . Hypertension   . Hyperlipidemia    obesity hypoventilation syndrome    Current Outpatient Prescriptions  Medication Sig Dispense Refill  . albuterol (PROAIR HFA) 108 (90 BASE) MCG/ACT inhaler Inhale 2 puffs into the lungs every 6 (six) hours as needed.        . ARIPiprazole (ABILIFY) 20 MG tablet Take 20 mg by mouth daily.        . DULoxetine (CYMBALTA) 60 MG capsule Take 60 mg by mouth daily.        . fenofibrate micronized (LOFIBRA) 134 MG capsule Take 134 mg by mouth daily before breakfast.        . hydrocodone-acetaminophen (LORCET-HD) 5-500 MG per capsule Take 1 capsule by mouth every 6 (six) hours as needed.        . metFORMIN (GLUCOPHAGE) 1000 MG tablet Take 1 tablet (1,000 mg total) by mouth 2 (two) times daily with a meal. Hold until 02/23/12; then resume use as previously prescribed.      . metoprolol (TOPROL-XL) 100 MG 24 hr tablet Take 100 mg by mouth daily.        . niacin (NIASPAN) 1000 MG CR tablet Take 1,000 mg by mouth at bedtime.        . nitroGLYCERIN (NITRODUR - DOSED IN MG/24 HR) 0.1 mg/hr Place 1 patch onto the skin daily as needed.        . pantoprazole (PROTONIX) 40 MG tablet Take 1 tablet (40 mg total) by mouth 2 (two) times daily. Take 30-60 minutes before the first meal of the day  60 tablet  1  .  pioglitazone (ACTOS) 30 MG tablet Take 30 mg by mouth daily.        . rosuvastatin (CRESTOR) 20 MG tablet Take 20 mg by mouth daily.        . valsartan (DIOVAN) 160 MG tablet Take 1 tablet (160 mg total) by mouth daily.  30 tablet  1  . Vitamin D, Ergocalciferol, (DRISDOL) 50000 UNITS CAPS Take 50,000 Units by mouth 2 (two) times a week. Mondays, Wednesday      . zolpidem (AMBIEN) 10 MG tablet Take 10 mg by mouth at bedtime as needed.        . [DISCONTINUED] pantoprazole (PROTONIX) 40 MG tablet        No current facility-administered medications for this visit.   Facility-Administered Medications Ordered in Other Visits  Medication Dose Route Frequency Provider Last Rate Last Dose  . [COMPLETED] iohexol (OMNIPAQUE) 350 MG/ML injection 80 mL  80 mL Intravenous Once PRN Medication Radiologist, MD   80 mL at 08/22/12 1145    Physical Exam BP 182/92  Pulse 79  Resp 20  Ht 5\' 4"  (1.626 m)  Wt 284 lb (128.822 kg)  BMI 48.75  kg/m2  SpO2 91% General morbidly obese 61 year old woman in no acute distress Neurologically alert and oriented x3 with no focal deficits Neck no carotid bruits Cardiac regular rate and rhythm normal S1 and S2 Lungs diminished breath sounds bilaterally  Diagnostic Tests: CT into the chest 08/22/2012 Has not yet been officially read I see no change in the ascending aortic aneurysm.   Impression: 61 year old woman multiple medical problems including hypertension and an ascending aortic aneurysm. Her ascending aorta is unchanged from 6 months ago. There still is no indication for surgery. She needs annual imaging.  Her blood pressure is poorly controlled with a systolic of [id number]. She just arrived from her CT angiogram and is very anxious currently. I suspect she may need to change her medical regimen but will defer that to Dr. Melina Copa her primary physician. I did recommend that she consider obtaining a blood pressure cuff for home monitoring.   Plan: Encouraged to  schedule a follow with Dr. Melina Copa or Silverio Decamp soon as possible  I will see her back in one year with an MR angiogram chest to followup her ascending aneurysm

## 2013-08-03 ENCOUNTER — Other Ambulatory Visit: Payer: Self-pay | Admitting: *Deleted

## 2013-08-03 DIAGNOSIS — I712 Thoracic aortic aneurysm, without rupture: Secondary | ICD-10-CM

## 2013-08-03 DIAGNOSIS — I7121 Aneurysm of the ascending aorta, without rupture: Secondary | ICD-10-CM

## 2013-08-27 ENCOUNTER — Ambulatory Visit
Admission: RE | Admit: 2013-08-27 | Discharge: 2013-08-27 | Disposition: A | Discharge status: Home / Self Care | Payer: Medicare Other | Source: Ambulatory Visit | Attending: Thoracic Surgery (Cardiothoracic Vascular Surgery) | Admitting: Thoracic Surgery (Cardiothoracic Vascular Surgery)

## 2013-08-27 DIAGNOSIS — I7121 Aneurysm of the ascending aorta, without rupture: Secondary | ICD-10-CM

## 2013-08-27 DIAGNOSIS — I712 Thoracic aortic aneurysm, without rupture: Secondary | ICD-10-CM

## 2013-08-27 MED ORDER — GADOBENATE DIMEGLUMINE 529 MG/ML IV SOLN
20.0000 mL | Freq: Once | INTRAVENOUS | Status: AC | PRN
  Administered 2013-08-27: 20 mL via INTRAVENOUS

## 2013-08-28 ENCOUNTER — Encounter: Payer: Medicare Other | Admitting: Thoracic Surgery (Cardiothoracic Vascular Surgery)

## 2013-09-11 ENCOUNTER — Encounter: Payer: Self-pay | Admitting: Thoracic Surgery (Cardiothoracic Vascular Surgery)

## 2013-09-11 ENCOUNTER — Ambulatory Visit (INDEPENDENT_AMBULATORY_CARE_PROVIDER_SITE_OTHER): Payer: Medicare Other | Admitting: Thoracic Surgery (Cardiothoracic Vascular Surgery)

## 2013-09-11 VITALS — BP 150/84 | HR 75 | Resp 20 | Ht 64.0 in | Wt 272.0 lb

## 2013-09-11 DIAGNOSIS — I712 Thoracic aortic aneurysm, without rupture, unspecified: Secondary | ICD-10-CM

## 2013-09-11 DIAGNOSIS — I7121 Aneurysm of the ascending aorta, without rupture: Secondary | ICD-10-CM

## 2013-09-11 NOTE — Progress Notes (Signed)
HPI:  Kara Nunez returns today for a 1 year followup visit. She is a 62 year old woman who was found about 2 years ago to have a 4.3 x 4.5 cm ascending aortic aneurysm. We have been following her since that time.  She says that in the interim since her last visit her shortness of breath has improved. She attributes this to using CPAP at night. He does still get short of breath and tightness in her chest with exertion. She had been worked up by a cardiologist in [place] several years ago, but has not seen a cardiologist recently. She's not had any severe chest pains. They've always been exertional in relieved with rest after a short time.  She did not like the MR. She felt very claustrophobic and uncomfortable with the procedure. She wishes to have CT scans rather than MRs in the future.  Past Medical History  Diagnosis Date  . CAD (coronary artery disease)   . Diabetes mellitus   . Hypertension   . Hyperlipidemia    morbid obesity Chronic respiratory failure Sleep apnea Asthma    Current Outpatient Prescriptions  Medication Sig Dispense Refill  . albuterol (PROAIR HFA) 108 (90 BASE) MCG/ACT inhaler Inhale 2 puffs into the lungs every 6 (six) hours as needed.        . DULoxetine (CYMBALTA) 60 MG capsule Take 60 mg by mouth daily.        . fenofibrate micronized (LOFIBRA) 134 MG capsule Take 134 mg by mouth daily before breakfast.        . hydrocodone-acetaminophen (LORCET-HD) 5-500 MG per capsule Take 1 capsule by mouth every 6 (six) hours as needed.        . metFORMIN (GLUCOPHAGE) 1000 MG tablet Take 1 tablet (1,000 mg total) by mouth 2 (two) times daily with a meal. Hold until 02/23/12; then resume use as previously prescribed.      . metoprolol (TOPROL-XL) 100 MG 24 hr tablet Take 100 mg by mouth daily.        . nitroGLYCERIN (NITRODUR - DOSED IN MG/24 HR) 0.1 mg/hr Place 1 patch onto the skin daily as needed.        . pantoprazole (PROTONIX) 40 MG tablet Take 1 tablet (40 mg  total) by mouth 2 (two) times daily. Take 30-60 minutes before the first meal of the day  60 tablet  1  . rosuvastatin (CRESTOR) 20 MG tablet Take 20 mg by mouth daily.        . valsartan (DIOVAN) 160 MG tablet Take 1 tablet (160 mg total) by mouth daily.  30 tablet  1  . Vitamin D, Ergocalciferol, (DRISDOL) 50000 UNITS CAPS Take 50,000 Units by mouth 2 (two) times a week. Mondays, Wednesday      . zolpidem (AMBIEN) 10 MG tablet Take 10 mg by mouth at bedtime as needed.         No current facility-administered medications for this visit.    Physical Exam BP 150/84  Pulse 75  Resp 20  Ht 5\' 4"  (1.626 m)  Wt 272 lb (123.378 kg)  BMI 46.67 kg/m2  SpO40 76% 62 year old obese woman in no acute distress Neurologic alert and oriented x3 with no focal deficits No carotid bruits Cardiac regular rate and rhythm normal Q000111Q, faint systolic murmur Lungs clear bilaterally Peripheral pulses intact  Diagnostic Tests: MR angiogram chest 08/27/2013 FINDINGS:  Aneurysmal dilatation of the aortic root is stable, measuring 4.4 cm  in greatest diameter. The aorta is not dilated at  the level of the  sinuses of Valsalva. The aortic arch has a normal diameter of 3.3  cm. The descending thoracic aorta measures 2.4 cm. No dissection is  identified. Proximal great vessels shows stable patency without  evidence of stenosis.  Stable dilatation of the main pulmonary artery segment to 4 cm in  greatest diameter. No other incidental findings in the visualized  chest or mediastinum.  IMPRESSION:  Stable aneurysmal dilatation of the ascending thoracic aorta,  measuring 4.4 cm in greatest diameter.  Impression: 62 year old woman with a known ascending aortic aneurysm. This is been stable for the past couple of years. There is no indication for surgery at this time.  Her blood pressure is a little elevated today. She says that she was anxious about the visit and concerned that she might need surgery. She sees  [name] in about a week for her annual physical. I will defer the decision as to whether any medication changes are warranted to her. I advised her to discuss her chest tightness with her as well. She might benefit from another cardiology evaluation.  Plan: Return in one year with CT angiogram of chest to followup ascending aortic aneurysm

## 2013-10-18 ENCOUNTER — Encounter: Payer: Self-pay | Admitting: *Deleted

## 2013-10-18 DIAGNOSIS — I2510 Atherosclerotic heart disease of native coronary artery without angina pectoris: Secondary | ICD-10-CM | POA: Insufficient documentation

## 2013-10-18 DIAGNOSIS — E785 Hyperlipidemia, unspecified: Secondary | ICD-10-CM

## 2013-10-18 DIAGNOSIS — E1165 Type 2 diabetes mellitus with hyperglycemia: Secondary | ICD-10-CM | POA: Insufficient documentation

## 2013-10-18 DIAGNOSIS — E119 Type 2 diabetes mellitus without complications: Secondary | ICD-10-CM

## 2013-10-18 DIAGNOSIS — IMO0002 Reserved for concepts with insufficient information to code with codable children: Secondary | ICD-10-CM | POA: Insufficient documentation

## 2013-10-18 DIAGNOSIS — E569 Vitamin deficiency, unspecified: Secondary | ICD-10-CM | POA: Insufficient documentation

## 2013-10-18 DIAGNOSIS — N289 Disorder of kidney and ureter, unspecified: Secondary | ICD-10-CM | POA: Insufficient documentation

## 2013-10-18 DIAGNOSIS — E782 Mixed hyperlipidemia: Secondary | ICD-10-CM | POA: Insufficient documentation

## 2013-10-19 ENCOUNTER — Ambulatory Visit (INDEPENDENT_AMBULATORY_CARE_PROVIDER_SITE_OTHER): Payer: Medicare Other | Admitting: Cardiology

## 2013-10-19 ENCOUNTER — Encounter: Payer: Self-pay | Admitting: Cardiology

## 2013-10-19 ENCOUNTER — Encounter (INDEPENDENT_AMBULATORY_CARE_PROVIDER_SITE_OTHER): Payer: Self-pay

## 2013-10-19 VITALS — BP 150/71 | HR 80 | Ht 64.0 in | Wt 269.0 lb

## 2013-10-19 DIAGNOSIS — I2510 Atherosclerotic heart disease of native coronary artery without angina pectoris: Secondary | ICD-10-CM

## 2013-10-19 DIAGNOSIS — I712 Thoracic aortic aneurysm, without rupture, unspecified: Secondary | ICD-10-CM

## 2013-10-19 DIAGNOSIS — I7121 Aneurysm of the ascending aorta, without rupture: Secondary | ICD-10-CM

## 2013-10-19 DIAGNOSIS — I10 Essential (primary) hypertension: Secondary | ICD-10-CM

## 2013-10-19 DIAGNOSIS — E782 Mixed hyperlipidemia: Secondary | ICD-10-CM

## 2013-10-19 NOTE — Progress Notes (Signed)
Clinical Summary Kara Nunez is a 63 y.o.female referred for cardiology consultation by [name] NP. History is reviewed below, including known ascending aortic aneurysm that has been followed by Dr. [name]. This has been stable over time, recently documented 4.4 cm in greatest diameter by MRA.  Details of her cardiac history are not available at this time. She saw a cardiologist in [place] approximately 15 years ago. She recalls having a cardiac catheterization prior to that, does not remember whether she had any obstructive CAD, denies having any percutaneous interventions or MI.  Echocardiogram from June 2013 reported moderate LVH with LVEF 0000000, grade 2 diastolic dysfunction, moderate left atrial enlargement, PASP 44 mm mercury.  Recent lab work showed hemoglobin 13.5, platelets 258, BUN 20, creatinine 0.8, normal LFTs, potassium 3.7, hemoglobin A1c 8.5.  Chest reports a long-standing history of "angina." She has exertional symptoms that have not progressed over the last 3 years.  ECG today shows sinus rhythm with nonspecific ST changes.   Allergies  Allergen Reactions  . Codeine Nausea Only  . Amoxicillin Rash    Current Outpatient Prescriptions  Medication Sig Dispense Refill  . albuterol (PROAIR HFA) 108 (90 BASE) MCG/ACT inhaler Inhale 2 puffs into the lungs every 6 (six) hours as needed.        . DULoxetine (CYMBALTA) 60 MG capsule Take 60 mg by mouth daily.        . fenofibrate micronized (LOFIBRA) 134 MG capsule Take 134 mg by mouth daily before breakfast.        . hydrocodone-acetaminophen (LORCET-HD) 5-500 MG per capsule Take 1 capsule by mouth every 6 (six) hours as needed.        . metFORMIN (GLUCOPHAGE) 1000 MG tablet Take 1 tablet (1,000 mg total) by mouth 2 (two) times daily with a meal. Hold until 02/23/12; then resume use as previously prescribed.      . metoprolol (TOPROL-XL) 100 MG 24 hr tablet Take 100 mg by mouth daily.        Marland Kitchen NIFEdipine  (PROCARDIA XL/ADALAT-CC) 90 MG 24 hr tablet Take 90 mg by mouth daily.      . nitroGLYCERIN (NITRODUR - DOSED IN MG/24 HR) 0.1 mg/hr Place 1 patch onto the skin daily as needed.        Marland Kitchen OLANZapine (ZYPREXA) 5 MG tablet Take 5 mg by mouth at bedtime.      . pantoprazole (PROTONIX) 40 MG tablet Take 1 tablet (40 mg total) by mouth 2 (two) times daily. Take 30-60 minutes before the first meal of the day  60 tablet  1  . potassium chloride (K-DUR,KLOR-CON) 10 MEQ tablet Take 10 mEq by mouth 2 (two) times daily.      . rosuvastatin (CRESTOR) 20 MG tablet Take 20 mg by mouth daily.        Marland Kitchen zolpidem (AMBIEN) 10 MG tablet Take 10 mg by mouth at bedtime as needed.        . valsartan (DIOVAN) 160 MG tablet Take 320 mg by mouth daily.       No current facility-administered medications for this visit.    Past Medical History  Diagnosis Date  . Coronary atherosclerosis of native coronary artery     Prior evaluation with in [facility] - details not clear   . Type 2 diabetes mellitus   . Essential hypertension, benign   . Hyperlipidemia   . Degenerative disc disease   . Chronic back pain   . Insomnia   .  Allergic rhinitis   . Ascending aortic aneurysm     Followed by Dr. [name]    Past Surgical History  Procedure Laterality Date  . Cholecystectomy    . Tubal ligation      Family History  Problem Relation Age of Onset  . Asthma Mother   . Allergies Mother   . Allergies Maternal Grandmother   . Heart disease Maternal Grandfather   . Heart disease Maternal Grandmother   . Heart disease Paternal Grandfather   . Heart disease Paternal Grandmother   . Heart disease Mother   . Breast cancer Maternal Grandmother     Social History Kara Nunez reports that she has never smoked. She has never used smokeless tobacco. Kara Nunez reports that she drinks alcohol.  Review of Systems No palpitations, dizziness, syncope. No orthopnea or PND. No claudication. Otherwise negative except as  outlined.  Physical Examination Filed Vitals:   10/19/13 1015  BP: 150/71  Pulse: 80   Filed Weights   10/19/13 1015  Weight: 269 lb (122.018 kg)   Obese woman, appears comfortable at rest. HEENT: Conjunctiva and lids normal, oropharynx clear. Neck: Supple, no elevated JVP or carotid bruits, no thyromegaly. Lungs: Clear to auscultation, nonlabored breathing at rest. Cardiac: Regular rate and rhythm, S4, no significant systolic murmur, no pericardial rub. Abdomen: Soft, nontender, protuberant, bowel sounds present. Extremities: Trace edema, distal pulses 2+. Skin: Warm and dry. Musculoskeletal: No kyphosis. Neuropsychiatric: Alert and oriented x3, affect grossly appropriate.   Problem List and Plan   Coronary atherosclerosis of native coronary artery Details are not clear, reportedly had remote evaluation in [place] approximately 15 years ago. ECG is reviewed and nonspecific. Medical regimen includes beta blocker, ARB, and statin. She reports a long-standing history of exertional angina. We will plan to followup with a Lexiscan Cardiolite to assess ischemic burden, if low risk, keep an annual followup.  Ascending aortic aneurysm Stable, 4.4 cm by recent evaluation, followed by Dr. [name].  Mixed hyperlipidemia On statin therapy, followed by Ms. [name] NP.  Essential hypertension, benign Blood pressure elevated today. Recommended followup with primary care provider. She may need further medication up titration for optimal control. Sodium restriction, weight loss would also be beneficial.    Satira Sark, M.D., F.A.C.C.

## 2013-10-19 NOTE — Assessment & Plan Note (Signed)
Stable, 4.4 cm by recent evaluation, followed by Dr. [name].

## 2013-10-19 NOTE — Assessment & Plan Note (Signed)
Details are not clear, reportedly had remote evaluation in [place] approximately 15 years ago. ECG is reviewed and nonspecific. Medical regimen includes beta blocker, ARB, and statin. She reports a long-standing history of exertional angina. We will plan to followup with a Lexiscan Cardiolite to assess ischemic burden, if low risk, keep an annual followup.

## 2013-10-19 NOTE — Assessment & Plan Note (Signed)
On statin therapy, followed by Ms. [name] NP.

## 2013-10-19 NOTE — Assessment & Plan Note (Signed)
Blood pressure elevated today. Recommended followup with primary care provider. She may need further medication up titration for optimal control. Sodium restriction, weight loss would also be beneficial.

## 2013-10-19 NOTE — Patient Instructions (Addendum)
Your physician wants you to follow-up in:1 year  You will receive a reminder letter in the mail two months in advance. If you don't receive a letter, please call our office to schedule the follow-up appointment.  Your physician recommends that you continue on your current medications as directed. Please refer to the Current Medication list given to you today.   Your physician has requested that you have a lexiscan myoview. For further information please visit [email or web address]. Please follow instruction sheet, as given.    Thanks for choosing [facility] !

## 2013-10-31 ENCOUNTER — Encounter (HOSPITAL_COMMUNITY): Payer: Self-pay

## 2013-10-31 ENCOUNTER — Encounter (HOSPITAL_COMMUNITY)
Admission: RE | Admit: 2013-10-31 | Discharge: 2013-10-31 | Disposition: A | Discharge status: Home / Self Care | Payer: Medicare Other | Source: Ambulatory Visit | Attending: Cardiology | Admitting: Cardiology

## 2013-10-31 ENCOUNTER — Encounter (HOSPITAL_COMMUNITY): Payer: Medicare Other

## 2013-10-31 DIAGNOSIS — I2510 Atherosclerotic heart disease of native coronary artery without angina pectoris: Secondary | ICD-10-CM

## 2013-10-31 DIAGNOSIS — I259 Chronic ischemic heart disease, unspecified: Secondary | ICD-10-CM | POA: Insufficient documentation

## 2013-10-31 DIAGNOSIS — R079 Chest pain, unspecified: Secondary | ICD-10-CM | POA: Insufficient documentation

## 2013-10-31 MED ORDER — TECHNETIUM TC 99M SESTAMIBI GENERIC - CARDIOLITE
10.0000 | Freq: Once | INTRAVENOUS | Status: AC | PRN
  Administered 2013-10-31: 10 via INTRAVENOUS

## 2013-10-31 MED ORDER — SODIUM CHLORIDE 0.9 % IJ SOLN
INTRAMUSCULAR | Status: AC
  Administered 2013-10-31: 10 mL via INTRAVENOUS
  Filled 2013-10-31: qty 10

## 2013-10-31 MED ORDER — TECHNETIUM TC 99M SESTAMIBI - CARDIOLITE: 30.0000 | Freq: Once | INTRAVENOUS | Status: DC | PRN

## 2013-10-31 MED ORDER — REGADENOSON 0.4 MG/5ML IV SOLN
INTRAVENOUS | Status: AC
  Administered 2013-10-31: 0.4 mg via INTRAVENOUS
  Filled 2013-10-31: qty 5

## 2013-10-31 NOTE — Progress Notes (Signed)
Stress Lab Nurses Notes - Kara Nunez  Kara Nunez 10/31/2013 Reason for doing test: CAD Type of test: Wille Glaser Nurse performing test: Gerrit Halls, RN Nuclear Medicine Tech: Redmond Baseman Echo Tech: Not Applicable MD performing test: Dr. Jefm Bryant MD: Octavio Graves Test explained and consent signed: yes IV started: 22g jelco, Saline lock flushed, No redness or edema and Saline lock started in radiology Symptoms: Dizziness Treatment/Intervention: None Reason test stopped: protocol completed After recovery IV was: Discontinued via X-ray tech and No redness or edema Patient to return to [place]. Med at : 12:00 Patient discharged: Home Patient's Condition upon discharge was: stable Comments: During test BP 115/57 & HR 81.  Recovery BP 116/59 & HR 77.  Symptoms resolved in recovery. [name] T

## 2014-03-06 ENCOUNTER — Ambulatory Visit: Payer: Medicare Other | Admitting: Cardiovascular Disease

## 2014-03-08 ENCOUNTER — Encounter: Payer: Self-pay | Admitting: *Deleted

## 2014-04-02 ENCOUNTER — Encounter: Payer: Self-pay | Admitting: *Deleted

## 2014-05-13 ENCOUNTER — Encounter: Payer: Self-pay | Admitting: *Deleted

## 2014-07-16 ENCOUNTER — Other Ambulatory Visit: Payer: Self-pay

## 2014-07-16 DIAGNOSIS — I712 Thoracic aortic aneurysm, without rupture, unspecified: Secondary | ICD-10-CM

## 2014-07-22 ENCOUNTER — Other Ambulatory Visit: Payer: Self-pay

## 2014-07-22 DIAGNOSIS — I712 Thoracic aortic aneurysm, without rupture, unspecified: Secondary | ICD-10-CM

## 2014-08-20 ENCOUNTER — Ambulatory Visit: Payer: Medicare Other | Admitting: Thoracic Surgery (Cardiothoracic Vascular Surgery)

## 2014-08-20 ENCOUNTER — Inpatient Hospital Stay: Admission: RE | Admit: 2014-08-20 | Payer: Medicare Other | Source: Ambulatory Visit

## 2014-08-21 ENCOUNTER — Telehealth: Payer: Self-pay | Admitting: *Deleted

## 2014-09-10 ENCOUNTER — Encounter: Payer: Self-pay | Admitting: *Deleted

## 2014-09-17 ENCOUNTER — Ambulatory Visit
Admission: RE | Admit: 2014-09-17 | Discharge: 2014-09-17 | Disposition: A | Discharge status: Home / Self Care | Payer: Medicare Other | Source: Ambulatory Visit | Attending: Thoracic Surgery (Cardiothoracic Vascular Surgery) | Admitting: Thoracic Surgery (Cardiothoracic Vascular Surgery)

## 2014-09-17 ENCOUNTER — Encounter: Payer: Self-pay | Admitting: Thoracic Surgery (Cardiothoracic Vascular Surgery)

## 2014-09-17 ENCOUNTER — Ambulatory Visit (INDEPENDENT_AMBULATORY_CARE_PROVIDER_SITE_OTHER): Payer: Medicare Other | Admitting: Thoracic Surgery (Cardiothoracic Vascular Surgery)

## 2014-09-17 VITALS — BP 128/68 | HR 76 | Resp 20 | Ht 64.0 in | Wt 270.0 lb

## 2014-09-17 DIAGNOSIS — I712 Thoracic aortic aneurysm, without rupture, unspecified: Secondary | ICD-10-CM

## 2014-09-17 DIAGNOSIS — I7121 Aneurysm of the ascending aorta, without rupture: Secondary | ICD-10-CM

## 2014-09-17 LAB — CREATININE, SERUM: Creat: 1 mg/dL (ref 0.50–1.10)

## 2014-09-17 LAB — BUN: BUN: 16 mg/dL (ref 6–23)

## 2014-09-17 MED ORDER — IOHEXOL 350 MG/ML SOLN
75.0000 mL | Freq: Once | INTRAVENOUS | Status: AC | PRN
  Administered 2014-09-17: 75 mL via INTRAVENOUS

## 2014-09-17 NOTE — Progress Notes (Signed)
HPI:  Kara Nunez returns today for a scheduled 1 year followup visit.   She is a 64 year old woman who was found to have a 4.3 x 4.5 cm ascending aortic aneurysm in 2012. We have been following her since that time.  At her last visit she was complaining of having chest tightness with exertion. She saw Dr. Domenic Polite in February. Since her last visit her shortness of breath and chest tightness have improved. She attributes this to using CPAP at night.   She is not exercising on a regular basis, but says she has resolved to do so. [facility]  Past Medical History  Diagnosis Date  . Coronary atherosclerosis of native coronary artery     Prior evaluation with in [facility] - details not clear   . Type 2 diabetes mellitus   . Essential hypertension, benign   . Hyperlipidemia   . Degenerative disc disease   . Chronic back pain   . Insomnia   . Allergic rhinitis   . Ascending aortic aneurysm     Followed by Dr. [name]      Current Outpatient Prescriptions  Medication Sig Dispense Refill  . albuterol (PROAIR HFA) 108 (90 BASE) MCG/ACT inhaler Inhale 2 puffs into the lungs every 6 (six) hours as needed.      . DULoxetine (CYMBALTA) 60 MG capsule Take 60 mg by mouth daily.      . fenofibrate micronized (LOFIBRA) 134 MG capsule Take 134 mg by mouth daily before breakfast.      . hydrocodone-acetaminophen (LORCET-HD) 5-500 MG per capsule Take 1 capsule by mouth every 6 (six) hours as needed.      . metFORMIN (GLUCOPHAGE) 1000 MG tablet Take 1 tablet (1,000 mg total) by mouth 2 (two) times daily with a meal. Hold until 02/23/12; then resume use as previously prescribed.    . metoprolol (TOPROL-XL) 100 MG 24 hr tablet Take 100 mg by mouth daily.      Marland Kitchen NIFEdipine (PROCARDIA XL/ADALAT-CC) 90 MG 24 hr tablet Take 90 mg by mouth daily.    . nitroGLYCERIN (NITRODUR - DOSED IN MG/24 HR) 0.1 mg/hr Place 1 patch onto the skin daily as needed.      Marland Kitchen OLANZapine  (ZYPREXA) 5 MG tablet Take 5 mg by mouth at bedtime.    . pantoprazole (PROTONIX) 40 MG tablet Take 1 tablet (40 mg total) by mouth 2 (two) times daily. Take 30-60 minutes before the first meal of the day 60 tablet 1  . potassium chloride (K-DUR,KLOR-CON) 10 MEQ tablet Take 10 mEq by mouth 2 (two) times daily.    . rosuvastatin (CRESTOR) 20 MG tablet Take 20 mg by mouth daily.      Marland Kitchen zolpidem (AMBIEN) 10 MG tablet Take 10 mg by mouth at bedtime as needed.      . valsartan (DIOVAN) 160 MG tablet Take 320 mg by mouth daily.     No current facility-administered medications for this visit.    Physical Exam BP 128/68 mmHg  Pulse 76  Resp 20  Ht 5\' 4"  (1.626 m)  Wt 270 lb (122.471 kg)  BMI 46.32 kg/m2  SpO2 13% Obese 64 year old woman in no acute distress Alert and oriented 3 with no neurologic deficits Cardiac regular rate and rhythm with a 2/6 systolic murmur Lungs clear with equal breath sounds bilaterally Abdomen obese soft and nontender Extremities 2+ pulses bilaterally  Diagnostic Tests: CT ANGIOGRAPHY CHEST WITH CONTRAST  TECHNIQUE: Multidetector CT  imaging of the chest was performed using the standard protocol during bolus administration of intravenous contrast. Multiplanar CT image reconstructions and MIPs were obtained to evaluate the vascular anatomy.  CONTRAST: 13mL OMNIPAQUE IOHEXOL 350 MG/ML SOLN  COMPARISON: MR 08/27/2013 and earlier studies  FINDINGS: Noncontrast scan not obtained. The SVC is patent. RV/LV ratio is normal. There is fair contrast opacification of central pulmonary artery branches which are grossly unremarkable ; the exam was not optimized for detection of pulmonary emboli. Patent superior and inferior pulmonary veins bilaterally. Patchy coronary calcifications. The thoracic aorta measures 2.9 cm at the level of the sino-tubular junction, 4.5 cm mid ascending (previously 4.4 cm), 3.7 cm distal ascending/ proximal arch, 2.9 cm distal arch.  The descending aorta is unremarkable. There is classic 3 vessel brachiocephalic arterial origin anatomy without proximal stenosis. No aortic dissection or stenosis. Minimal atheromatous plaque.  No significant pericardial or pleural effusion. Subcentimeter prevascular, AP window, and right paratracheal lymph nodes. No hilar adenopathy. Relatively low volumes with some bronchovascular crowding. No focal infiltrate or nodule. Thoracic spine and sternum intact. Surgical clips in the gallbladder fossa. Remainder visualized upper abdomen unremarkable.  Review of the MIP images confirms the above findings.  IMPRESSION: 1. 4.5 cm ascending aortic aneurysm, previously 4.4 cm, without complicating features. 2. Atherosclerosis, including coronary artery disease. Please note that although the presence of coronary artery calcium documents the presence of coronary artery disease, the severity of this disease and any potential stenosis cannot be assessed on this non-gated CT examination. Assessment for potential risk factor modification, dietary therapy or pharmacologic therapy may be warranted, if clinically indicated.   Electronically Signed  By: Arne Cleveland M.D.  On: 09/17/2014 14:29   Impression: 64 year old woman with a 4.5 cm ascending aortic aneurysm. This is unchanged from her scanning year ago. There is no indication for surgery at this time. Management of hypertension is utmost importance. Her blood pressure was well controlled today. She will need continued follow-up.  Plan:  Return in one year with CT angiogram of chest

## 2014-11-05 ENCOUNTER — Ambulatory Visit: Payer: Medicare Other | Admitting: Cardiovascular Disease

## 2014-11-08 ENCOUNTER — Ambulatory Visit: Payer: Medicare Other | Admitting: Cardiology

## 2014-11-14 ENCOUNTER — Ambulatory Visit: Payer: Medicare Other | Admitting: Cardiology

## 2014-12-19 ENCOUNTER — Encounter: Payer: Self-pay | Admitting: Cardiology

## 2014-12-19 ENCOUNTER — Ambulatory Visit (INDEPENDENT_AMBULATORY_CARE_PROVIDER_SITE_OTHER): Payer: Medicare Other | Admitting: Cardiology

## 2014-12-19 VITALS — BP 142/88 | HR 69 | Ht 64.0 in | Wt 247.0 lb

## 2014-12-19 DIAGNOSIS — E782 Mixed hyperlipidemia: Secondary | ICD-10-CM | POA: Diagnosis not present

## 2014-12-19 DIAGNOSIS — I25119 Atherosclerotic heart disease of native coronary artery with unspecified angina pectoris: Secondary | ICD-10-CM | POA: Diagnosis not present

## 2014-12-19 DIAGNOSIS — I7121 Aneurysm of the ascending aorta, without rupture: Secondary | ICD-10-CM

## 2014-12-19 DIAGNOSIS — I10 Essential (primary) hypertension: Secondary | ICD-10-CM

## 2014-12-19 DIAGNOSIS — I712 Thoracic aortic aneurysm, without rupture: Secondary | ICD-10-CM | POA: Diagnosis not present

## 2014-12-19 NOTE — Patient Instructions (Signed)

## 2014-12-19 NOTE — Progress Notes (Signed)
Cardiology Office Note  Date: 12/19/2014   ID: Kara Nunez, DOB 1951-05-23, MRN [id number]  PCP: Octavio Graves, DO  Primary Cardiologist: Rozann Lesches, MD   Chief Complaint  Patient presents with  . Coronary Artery Disease  . Hypertension    History of Present Illness: Kara Nunez is a 64 y.o. female seen in initial consultation back in February 2015. She has undergone follow-up cardiac evaluation as outlined below, and has been managed medically. She presents today for a routine visit, denies any progressing angina symptoms. She is not involved in a regular exercise plan, we did discuss possibly getting back to swimming which she has enjoyed previously.  She continues to follow with Dr. [name] with known ascending aortic aneurysm, most recently documented at 4.5 cm by chest CT in January of this year.  Cardiac regimen as outlined below. She does not report any significant changes. Lipids have been followed by her primary care provider. She continues on Crestor without obvious intolerances.   Past Medical History  Diagnosis Date  . Coronary atherosclerosis of native coronary artery     Prior evaluation with in [facility] - details not clear   . Type 2 diabetes mellitus   . Essential hypertension, benign   . Hyperlipidemia   . Degenerative disc disease   . Chronic back pain   . Insomnia   . Allergic rhinitis   . Ascending aortic aneurysm     Followed by Dr. [name]     Current Outpatient Prescriptions  Medication Sig Dispense Refill  . albuterol (PROAIR HFA) 108 (90 BASE) MCG/ACT inhaler Inhale 2 puffs into the lungs every 6 (six) hours as needed.      . DULoxetine (CYMBALTA) 60 MG capsule Take 60 mg by mouth daily.      . fenofibrate micronized (LOFIBRA) 134 MG capsule Take 134 mg by mouth daily before breakfast.      . hydrocodone-acetaminophen (LORCET-HD) 5-500 MG per capsule Take 1 capsule by mouth every 6 (six) hours as needed.      Marland Kitchen JANUVIA 100  MG tablet Take 100 mg by mouth daily.     . metFORMIN (GLUCOPHAGE) 1000 MG tablet Take 1 tablet (1,000 mg total) by mouth 2 (two) times daily with a meal. Hold until 02/23/12; then resume use as previously prescribed.    . metoprolol (TOPROL-XL) 100 MG 24 hr tablet Take 100 mg by mouth daily.      Marland Kitchen NIFEdipine (PROCARDIA XL/ADALAT-CC) 90 MG 24 hr tablet Take 90 mg by mouth daily.    . nitroGLYCERIN (NITRODUR - DOSED IN MG/24 HR) 0.1 mg/hr Place 1 patch onto the skin daily as needed.      Marland Kitchen OLANZapine (ZYPREXA) 5 MG tablet Take 5 mg by mouth at bedtime.    . pantoprazole (PROTONIX) 40 MG tablet Take 1 tablet (40 mg total) by mouth 2 (two) times daily. Take 30-60 minutes before the first meal of the day 60 tablet 1  . potassium chloride (K-DUR,KLOR-CON) 10 MEQ tablet Take 10 mEq by mouth 2 (two) times daily.    . rosuvastatin (CRESTOR) 20 MG tablet Take 20 mg by mouth daily.      Marland Kitchen zolpidem (AMBIEN) 10 MG tablet Take 10 mg by mouth at bedtime as needed.      . valsartan (DIOVAN) 160 MG tablet Take 320 mg by mouth daily.     No current facility-administered medications for this visit.    Allergies:  Codeine and Amoxicillin   Social History:  The patient  reports that she has never smoked. She has never used smokeless tobacco. She reports that she drinks alcohol. She reports that she does not use illicit drugs.   ROS:  Please see the history of present illness. Otherwise, complete review of systems is positive for weight gain.  All other systems are reviewed and negative.   Physical Exam: VS:  BP 142/88 mmHg  Pulse 69  Ht 5\' 4"  (1.626 m)  Wt 247 lb (112.038 kg)  BMI 42.38 kg/m2, BMI Body mass index is 42.38 kg/(m^2).  Wt Readings from Last 3 Encounters:  12/19/14 247 lb (112.038 kg)  09/17/14 270 lb (122.471 kg)  10/19/13 269 lb (122.018 kg)     General: Patient appears comfortable at rest. HEENT: Conjunctiva and lids normal, oropharynx clear with moist mucosa. Neck: Supple, no elevated  JVP or carotid bruits, no thyromegaly. Lungs: Clear to auscultation, nonlabored breathing at rest. Cardiac: Regular rate and rhythm, no S3 or significant systolic murmur, no pericardial rub. Abdomen: Soft, nontender, no hepatomegaly, bowel sounds present, no guarding or rebound. Extremities: No pitting edema, distal pulses 2+. Skin: Warm and dry. Musculoskeletal: No kyphosis. Neuropsychiatric: Alert and oriented x3, affect grossly appropriate.   ECG: ECG is not ordered today.  Recent Labwork: 07/22/2014: BUN 16 09/17/2014: Creatinine 1.00   Other Studies Reviewed Today:  Echocardiogram 02/21/2012: Study Conclusions  - Procedure narrative: Transthoracic echocardiography. The study was technically difficult, as a result of poor acoustic windows and poor sound wave transmission. - Left ventricle: Wall thickness was increased in a pattern of moderate LVH. There was moderate concentric hypertrophy. Systolic function was normal. The estimated ejection fraction was in the range of 55% to 65%. Features are consistent with a pseudonormal left ventricular filling pattern, with concomitant abnormal relaxation and increased filling pressure (grade 2 diastolic dysfunction). Doppler parameters are consistent with high ventricular filling pressure. - Left atrium: The atrium was moderately dilated. - Pulmonary arteries: PA peak pressure: 62mm Hg (S).  Lexiscan Cardiolite 10/31/2013: FINDINGS: Pharmacological stress  Baseline EKG showed normal sinus rhythm with left anterior fascicular block. After injection, heart rate increased from 62 beats per min up to 82 beats per min and blood pressure decreased from 164/62 down to 115/57. The test was terminated after injection was complete. The patient did not experience any chest pain. Post-injection EKG showed no ischemic changes.  Myocardial perfusion imaging  Raw images showed appropriate radiotracer uptake. There is a  small partially reversible defect in the distal anteroseptal wall, this area is mildly hypokinetic. There is a mild intensity moderate-sized defect in the lateral wall that is reversible, this area has normal wall motion. Raw images suggest that prominent breast and soft tissue over this lateral area are likely causing an artifact.  Gated imaging shows a end-diastolic volume of [id number] mL, end systolic volume 39 mL, left ventricular ejection fraction 62%, t.i.d. 1.10.  IMPRESSION: 1. Abnormal Lexiscan MPI, overall low risk study.  2. Small anteroseptal defect with mild reversibility.  3. Normal left ventricular systolic function.   ASSESSMENT AND PLAN:  1. Symptomatically stable CAD with intermittent angina. Ischemic testing from last year is noted above, overall low risk. We will continue medical therapy and observation unless symptoms escalate. Recommended a basic exercise regimen.  2. Essential hypertension, blood pressure is mildly elevated today. She states that she did not take her medicines yet this morning.  3. Hyperlipidemia, on Crestor, lipids followed by primary care provider.  4. Ascending aortic aneurysm, 4.5 cm by most  recent chest CT, followed by Dr. [name]. Asymptomatic.  Current medicines were reviewed at length with the patient today.   Disposition: FU with me in 1 year.   Signed, Satira Sark, MD, [facility] 12/19/2014 11:07 AM    [place] [facility] [facility] [place]. [address] [place], [place] [address] Phone: [phone]; Fax: [phone]

## 2015-09-10 ENCOUNTER — Other Ambulatory Visit: Payer: Self-pay | Admitting: Thoracic Surgery (Cardiothoracic Vascular Surgery)

## 2015-09-10 DIAGNOSIS — I712 Thoracic aortic aneurysm, without rupture: Secondary | ICD-10-CM

## 2015-09-10 DIAGNOSIS — I7121 Aneurysm of the ascending aorta, without rupture: Secondary | ICD-10-CM

## 2015-09-11 ENCOUNTER — Other Ambulatory Visit: Payer: Self-pay | Admitting: Thoracic Surgery (Cardiothoracic Vascular Surgery)

## 2015-09-11 DIAGNOSIS — I7121 Aneurysm of the ascending aorta, without rupture: Secondary | ICD-10-CM

## 2015-09-11 DIAGNOSIS — I712 Thoracic aortic aneurysm, without rupture: Secondary | ICD-10-CM

## 2015-10-07 ENCOUNTER — Ambulatory Visit (INDEPENDENT_AMBULATORY_CARE_PROVIDER_SITE_OTHER): Payer: Medicare HMO | Admitting: Thoracic Surgery (Cardiothoracic Vascular Surgery)

## 2015-10-07 ENCOUNTER — Encounter: Payer: Self-pay | Admitting: Thoracic Surgery (Cardiothoracic Vascular Surgery)

## 2015-10-07 ENCOUNTER — Ambulatory Visit
Admission: RE | Admit: 2015-10-07 | Discharge: 2015-10-07 | Disposition: A | Discharge status: Home / Self Care | Payer: Medicare Other | Source: Ambulatory Visit | Attending: Thoracic Surgery (Cardiothoracic Vascular Surgery) | Admitting: Thoracic Surgery (Cardiothoracic Vascular Surgery)

## 2015-10-07 VITALS — BP 148/84 | HR 68 | Resp 18 | Ht 64.0 in | Wt 242.0 lb

## 2015-10-07 DIAGNOSIS — I7121 Aneurysm of the ascending aorta, without rupture: Secondary | ICD-10-CM

## 2015-10-07 DIAGNOSIS — I712 Thoracic aortic aneurysm, without rupture: Secondary | ICD-10-CM

## 2015-10-07 LAB — CREATININE, ISTAT: CREATININE, ISTAT: 1 mg/dL (ref 0.6–1.3)

## 2015-10-07 MED ORDER — IOPAMIDOL (ISOVUE-370) INJECTION 76%
60.0000 mL | Freq: Once | INTRAVENOUS | Status: AC | PRN
  Administered 2015-10-07: 60 mL via INTRAVENOUS

## 2015-10-07 NOTE — Progress Notes (Signed)
[address]       [place] [address]             [phone]       HPI: Ms. Mccommon returns today for follow-up of her ascending aortic aneurysm.  She is a 65 year old woman who was found to have a 4.3 x 4.5 cm ascending aortic aneurysm in 2012. We have been following her since that time. Her most recent office visit was a year ago. Her aneurysm was unchanged at 4.5 cm.  She is followed Dr. Domenic Polite and has known coronary disease. Her most recent stress test was a low risk study in 2015. She has not been having chest pain, pressure, or tightness. She does get short of breath with physical exertion, but that has not changed over time. She is not exercising on a regular basis. She says she plans to start swimming again at that the only exercise she can really tolerate. Of note she told me that the last time she was here as well. She is a lifelong nonsmoker.  Past Medical History  Diagnosis Date  . Coronary atherosclerosis of native coronary artery     Prior evaluation with in [facility] - details not clear   . Type 2 diabetes mellitus ([place])   . Essential hypertension, benign   . Hyperlipidemia   . Degenerative disc disease   . Chronic back pain   . Insomnia   . Allergic rhinitis   . Ascending aortic aneurysm ([place])     Followed by Dr. [name]     Current Outpatient Prescriptions  Medication Sig Dispense Refill  . albuterol (PROAIR HFA) 108 (90 BASE) MCG/ACT inhaler Inhale 2 puffs into the lungs every 6 (six) hours as needed.      . DULoxetine (CYMBALTA) 60 MG capsule Take 60 mg by mouth daily.      . fenofibrate micronized (LOFIBRA) 134 MG capsule Take 134 mg by mouth daily before breakfast.      . hydrocodone-acetaminophen (LORCET-HD) 5-500 MG per capsule Take 1 capsule by mouth every 6 (six) hours as needed.      Marland Kitchen JANUVIA 100 MG tablet Take 100 mg by mouth daily.     . metFORMIN (GLUCOPHAGE) 1000 MG tablet Take 1 tablet (1,000 mg total) by mouth 2  (two) times daily with a meal. Hold until 02/23/12; then resume use as previously prescribed.    . metoprolol (TOPROL-XL) 100 MG 24 hr tablet Take 100 mg by mouth daily.      Marland Kitchen NIFEdipine (PROCARDIA XL/ADALAT-CC) 90 MG 24 hr tablet Take 90 mg by mouth daily.    . nitroGLYCERIN (NITRODUR - DOSED IN MG/24 HR) 0.1 mg/hr Place 1 patch onto the skin daily as needed.      Marland Kitchen OLANZapine (ZYPREXA) 5 MG tablet Take 5 mg by mouth at bedtime.    . pantoprazole (PROTONIX) 40 MG tablet Take 1 tablet (40 mg total) by mouth 2 (two) times daily. Take 30-60 minutes before the first meal of the day 60 tablet 1  . potassium chloride (K-DUR,KLOR-CON) 10 MEQ tablet Take 10 mEq by mouth 2 (two) times daily.    . rosuvastatin (CRESTOR) 20 MG tablet Take 20 mg by mouth daily.      . valsartan (DIOVAN) 160 MG tablet Take 320 mg by mouth daily.    Marland Kitchen zolpidem (AMBIEN) 10 MG tablet Take 10 mg by mouth at bedtime as needed.       No current  facility-administered medications for this visit.    Physical Exam BP 148/84 mmHg  Pulse 68  Resp 18  Ht 5\' 4"  (1.626 m)  Wt 242 lb (109.77 kg)  BMI 41.52 kg/m2  SpO2 11% Obese 65 year old woman in no acute distress Alert and oriented 3 with no focal neurologic deficits No carotid bruits Cardiac regular rate and rhythm, normal S1 and S2 Lungs clear with equal breath sounds bilaterally  Diagnostic Tests: CT ANGIOGRAPHY CHEST WITH CONTRAST  TECHNIQUE: Multidetector CT imaging of the chest was performed using the standard protocol during bolus administration of intravenous contrast. Multiplanar CT image reconstructions and MIPs were obtained to evaluate the vascular anatomy.  CONTRAST: 60 mL Isovue 300 IV  COMPARISON: 09/17/2014 as well as multiple additional prior studies.  FINDINGS: There is stable aneurysmal dilatation of the ascending thoracic aorta which measures 4.4 cm in greatest measured diameter. Diameter at the level of the sinuses of Valsalva is 3.2  cm. Diameter of the proximal arch is 3.1 cm. The distal arch measures 2.8 cm. The descending thoracic aorta measures 2.4 cm. Proximal great vessels demonstrate normal branching anatomy and normal patency. No evidence of aortic dissection or intramural hemorrhage.  Stable scattered small mediastinal lymph nodes. Stable mild amount of calcified plaque in the distribution of the LAD. The heart size is normal. Lungs show stable suggestion of a tiny peripheral nodule in the posterior left upper lobe measuring approximately 2-3 mm (image number 24 of lung windows). This was also present on one of the 2013 studies.  No infiltrates, edema or pleural fluid. The liver demonstrates stable steatosis.  Review of the MIP images confirms the above findings.  IMPRESSION: 1. Stable aneurysmal disease of the ascending thoracic aorta which measures 4.4 cm in greatest diameter. No evidence of aortic dissection or intramural hemorrhage. 2. Tiny 2-3 mm posterior left upper lobe nodule appears stable and was present in 2013.   Electronically Signed  By: Aletta Edouard M.D.  On: 10/07/2015 15:30  I personally reviewed the CT angiogram and concur with findings as noted above  Impression: 65 year old woman with a 4.5 cm ascending aortic aneurysm. This is unchanged going back to 2012.  There is no indication for surgery at this time. She does need continued follow-up. We did an MRI one time that she did not tolerate that well and wants to be followed the CTs instead.  I again emphasized the importance of blood pressure control. Her blood pressure was elevated today at 148/84. She is on high-dose of metoprolol 100 mg daily and also on Diovan.  I also emphasized the importance of exercise, which may help her blood pressure, as well as her weight and diabetes.  She was noted to have a small lung nodule on CT. That has been present since 2013 and is unchanged. It can be considered  benign.  Plan: Return in one year with CT angiogram of chest  Melrose Nakayama, MD [facility] [phone]

## 2015-10-14 ENCOUNTER — Ambulatory Visit: Payer: Medicare Other | Admitting: Thoracic Surgery (Cardiothoracic Vascular Surgery)

## 2015-10-14 ENCOUNTER — Other Ambulatory Visit: Payer: Medicare Other

## 2015-12-24 ENCOUNTER — Ambulatory Visit (INDEPENDENT_AMBULATORY_CARE_PROVIDER_SITE_OTHER): Payer: Medicare HMO | Admitting: Cardiology

## 2015-12-24 ENCOUNTER — Encounter: Payer: Self-pay | Admitting: Cardiology

## 2015-12-24 VITALS — BP 140/84 | HR 67 | Ht 64.0 in | Wt 236.0 lb

## 2015-12-24 DIAGNOSIS — E782 Mixed hyperlipidemia: Secondary | ICD-10-CM | POA: Diagnosis not present

## 2015-12-24 DIAGNOSIS — I25119 Atherosclerotic heart disease of native coronary artery with unspecified angina pectoris: Secondary | ICD-10-CM | POA: Diagnosis not present

## 2015-12-24 DIAGNOSIS — I712 Thoracic aortic aneurysm, without rupture: Secondary | ICD-10-CM

## 2015-12-24 DIAGNOSIS — I10 Essential (primary) hypertension: Secondary | ICD-10-CM | POA: Diagnosis not present

## 2015-12-24 DIAGNOSIS — I7121 Aneurysm of the ascending aorta, without rupture: Secondary | ICD-10-CM

## 2015-12-24 NOTE — Patient Instructions (Signed)
Your physician wants you to follow-up in: 1 year You will receive a reminder letter in the mail two months in advance. If you don't receive a letter, please call our office to schedule the follow-up appointment.   Your physician recommends that you continue on your current medications as directed. Please refer to the Current Medication list given to you today.    If you need a refill on your cardiac medications before your next appointment, please call your pharmacy.      Thank you for choosing [facility] !        

## 2015-12-24 NOTE — Progress Notes (Signed)
Cardiology Office Note  Date: 12/24/2015   ID: Kara Nunez, DOB 06-19-1951, MRN [id number]  PCP: Octavio Graves, DO  Primary Cardiologist: Rozann Lesches, MD   Chief Complaint  Patient presents with  . Coronary Artery Disease  . Ascending aortic aneurysm    History of Present Illness: Kara Nunez is a 65 y.o. female last seen in April 2016. She presents for a routine follow-up visit. Since last year she does not report any decrease in stamina, no reproducible exertional chest pain. She is trying to lose weight, started going back to swimming twice a week at the [facility]. Her weight is down 10 pounds compared to last year.  She continues to follow with Dr. [name] with stable ascending aortic aneurysm most recently measuring 4.4 cm. chest CTA is reviewed below.  ECG today reviewed showing sinus rhythm with leftward axis and increased voltage.  Reviewed her medications which are outlined below. She continues on Toprol-XL, nifedipine, Diovan, and Crestor.  She continues to follow with PCP for regular management of lipid status and also her glucose control.  Past Medical History  Diagnosis Date  . Coronary atherosclerosis of native coronary artery     Prior evaluation with in [facility] - details not clear   . Type 2 diabetes mellitus ([place])   . Essential hypertension, benign   . Hyperlipidemia   . Degenerative disc disease   . Chronic back pain   . Insomnia   . Allergic rhinitis   . Ascending aortic aneurysm ([facility])     Followed by Dr. [name]    Past Surgical History  Procedure Laterality Date  . Cholecystectomy    . Tubal ligation      Current Outpatient Prescriptions  Medication Sig Dispense Refill  . albuterol (PROAIR HFA) 108 (90 BASE) MCG/ACT inhaler Inhale 2 puffs into the lungs every 6 (six) hours as needed.      . DULoxetine (CYMBALTA) 60 MG capsule Take 60 mg by mouth daily.      . fenofibrate micronized (LOFIBRA) 134 MG capsule Take 134 mg by mouth  daily before breakfast.      . hydrocodone-acetaminophen (LORCET-HD) 5-500 MG per capsule Take 1 capsule by mouth every 6 (six) hours as needed.      . iron polysaccharides (NIFEREX) 150 MG capsule Take 150 mg by mouth daily.    Marland Kitchen JANUVIA 100 MG tablet Take 100 mg by mouth daily.     . metFORMIN (GLUCOPHAGE) 1000 MG tablet Take 1 tablet (1,000 mg total) by mouth 2 (two) times daily with a meal. Hold until 02/23/12; then resume use as previously prescribed.    . metoprolol (TOPROL-XL) 100 MG 24 hr tablet Take 100 mg by mouth daily.      Marland Kitchen NIFEdipine (PROCARDIA XL/ADALAT-CC) 90 MG 24 hr tablet Take 90 mg by mouth daily.    . nitroGLYCERIN (NITRODUR - DOSED IN MG/24 HR) 0.1 mg/hr Place 1 patch onto the skin daily as needed.      Marland Kitchen OLANZapine (ZYPREXA) 5 MG tablet Take 5 mg by mouth at bedtime.    . pantoprazole (PROTONIX) 40 MG tablet Take 1 tablet (40 mg total) by mouth 2 (two) times daily. Take 30-60 minutes before the first meal of the day 60 tablet 1  . potassium chloride (K-DUR,KLOR-CON) 10 MEQ tablet Take 10 mEq by mouth 2 (two) times daily.    . rosuvastatin (CRESTOR) 20 MG tablet Take 20 mg by mouth daily.      . valsartan (DIOVAN)  320 MG tablet Take 320 mg by mouth daily.    Marland Kitchen zolpidem (AMBIEN) 10 MG tablet Take 10 mg by mouth at bedtime as needed.       No current facility-administered medications for this visit.   Allergies:  Codeine and Amoxicillin   Social History: The patient  reports that she has never smoked. She has never used smokeless tobacco. She reports that she drinks alcohol. She reports that she does not use illicit drugs.   ROS:  Please see the history of present illness. Otherwise, complete review of systems is positive for arthritic symptoms.  All other systems are reviewed and negative.   Physical Exam: VS:  BP 140/84 mmHg  Pulse 67  Ht 5\' 4"  (1.626 m)  Wt 236 lb (107.049 kg)  BMI 40.49 kg/m2  SpO2 94%, BMI Body mass index is 40.49 kg/(m^2).  Wt Readings from  Last 3 Encounters:  12/24/15 236 lb (107.049 kg)  10/07/15 242 lb (109.77 kg)  12/19/14 247 lb (112.038 kg)    General: Obese woman, appears comfortable at rest. HEENT: Conjunctiva and lids normal, oropharynx clear. Neck: Supple, no elevated JVP or carotid bruits, no thyromegaly. Lungs: Clear to auscultation, nonlabored breathing at rest. Cardiac: Regular rate and rhythm, no S3, 2/6 systolic murmur, no pericardial rub. Abdomen: Soft, nontender, bowel sounds present, no guarding or rebound. Extremities: No pitting edema, distal pulses 2+. Skin: Warm and dry. Musculoskeletal: No kyphosis. Neuropsychiatric: Alert and oriented x3, affect grossly appropriate.  ECG:  I personally reviewed the prior tracing from 10/19/2013 which showed sinus rhythm with nonspecific ST changes.  Recent Labwork:  January 2017: Creatinine 1.0  Other Studies Reviewed Today:  Echocardiogram 02/21/2012: Study Conclusions  - Procedure narrative: Transthoracic echocardiography. The study was technically difficult, as a result of poor acoustic windows and poor sound wave transmission. - Left ventricle: Wall thickness was increased in a pattern of moderate LVH. There was moderate concentric hypertrophy. Systolic function was normal. The estimated ejection fraction was in the range of 55% to 65%. Features are consistent with a pseudonormal left ventricular filling pattern, with concomitant abnormal relaxation and increased filling pressure (grade 2 diastolic dysfunction). Doppler parameters are consistent with high ventricular filling pressure. - Left atrium: The atrium was moderately dilated. - Pulmonary arteries: PA peak pressure: 31mm Hg (S).  Lexiscan Cardiolite 10/31/2013: FINDINGS: Pharmacological stress  Baseline EKG showed normal sinus rhythm with left anterior fascicular block. After injection, heart rate increased from 62 beats per min up to 82 beats per min and blood pressure  decreased from 164/62 down to 115/57. The test was terminated after injection was complete. The patient did not experience any chest pain. Post-injection EKG showed no ischemic changes.  Myocardial perfusion imaging  Raw images showed appropriate radiotracer uptake. There is a small partially reversible defect in the distal anteroseptal wall, this area is mildly hypokinetic. There is a mild intensity moderate-sized defect in the lateral wall that is reversible, this area has normal wall motion. Raw images suggest that prominent breast and soft tissue over this lateral area are likely causing an artifact.  Gated imaging shows a end-diastolic volume of A999333 mL, end systolic volume 39 mL, left ventricular ejection fraction 62%, t.i.d. 1.10.  IMPRESSION: 1. Abnormal Lexiscan MPI, overall low risk study.  2. Small anteroseptal defect with mild reversibility.  3. Normal left ventricular systolic function.  Chest CTA 10/07/2015: FINDINGS: There is stable aneurysmal dilatation of the ascending thoracic aorta which measures 4.4 cm in greatest measured diameter. Diameter  at the level of the sinuses of Valsalva is 3.2 cm. Diameter of the proximal arch is 3.1 cm. The distal arch measures 2.8 cm. The descending thoracic aorta measures 2.4 cm. Proximal great vessels demonstrate normal branching anatomy and normal patency. No evidence of aortic dissection or intramural hemorrhage.  Stable scattered small mediastinal lymph nodes. Stable mild amount of calcified plaque in the distribution of the LAD. The heart size is normal. Lungs show stable suggestion of a tiny peripheral nodule in the posterior left upper lobe measuring approximately 2-3 mm (image number 24 of lung windows). This was also present on one of the 2013 studies.  No infiltrates, edema or pleural fluid. The liver demonstrates stable steatosis.  Review of the MIP images confirms the above  findings.  IMPRESSION: 1. Stable aneurysmal disease of the ascending thoracic aorta which measures 4.4 cm in greatest diameter. No evidence of aortic dissection or intramural hemorrhage. 2. Tiny 2-3 mm posterior left upper lobe nodule appears stable and was present in 2013.  Assessment and Plan:  1. Symptomatically stable CAD. Stress testing from 2015 is outlined above. I have encouraged her to continue with regular exercise plan and medical therapy.  2. Stable ascending aortic aneurysm most recently measuring 4.47 m. Keep follow-up with Dr. [name].  3. Essential hypertension, no changes made to current regimen which includes Toprol-XL, nifedipine and Diovan.  4. Hyperlipidemia, she continues on Crestor reports good lipid control with her PCP.  5. Obesity, agree with efforts to reduce weight.  Current medicines were reviewed with the patient today.   Orders Placed This Encounter  Procedures  . EKG 12-Lead    Disposition: FU with me in 1 year.   Signed, Satira Sark, MD, [facility] 12/24/2015 9:05 AM    [facility] at [facility] [address] [place], [place] [address] Phone: [phone]; Fax: [phone]

## 2016-06-28 ENCOUNTER — Other Ambulatory Visit: Payer: Self-pay | Admitting: Physician Assistant

## 2016-06-28 ENCOUNTER — Encounter: Payer: Self-pay | Admitting: Physician Assistant

## 2016-06-28 ENCOUNTER — Ambulatory Visit (INDEPENDENT_AMBULATORY_CARE_PROVIDER_SITE_OTHER): Payer: Commercial Managed Care - HMO | Admitting: Physician Assistant

## 2016-06-28 VITALS — BP 144/74 | HR 72 | Temp 97.5°F | Ht 64.0 in | Wt 236.0 lb

## 2016-06-28 DIAGNOSIS — G301 Alzheimer's disease with late onset: Secondary | ICD-10-CM | POA: Diagnosis not present

## 2016-06-28 DIAGNOSIS — M51369 Other intervertebral disc degeneration, lumbar region without mention of lumbar back pain or lower extremity pain: Secondary | ICD-10-CM | POA: Insufficient documentation

## 2016-06-28 DIAGNOSIS — E782 Mixed hyperlipidemia: Secondary | ICD-10-CM

## 2016-06-28 DIAGNOSIS — G309 Alzheimer's disease, unspecified: Secondary | ICD-10-CM

## 2016-06-28 DIAGNOSIS — E1165 Type 2 diabetes mellitus with hyperglycemia: Secondary | ICD-10-CM

## 2016-06-28 DIAGNOSIS — J9611 Chronic respiratory failure with hypoxia: Secondary | ICD-10-CM | POA: Diagnosis not present

## 2016-06-28 DIAGNOSIS — M5136 Other intervertebral disc degeneration, lumbar region: Secondary | ICD-10-CM

## 2016-06-28 DIAGNOSIS — R600 Localized edema: Secondary | ICD-10-CM

## 2016-06-28 DIAGNOSIS — N183 Chronic kidney disease, stage 3 unspecified: Secondary | ICD-10-CM

## 2016-06-28 DIAGNOSIS — I10 Essential (primary) hypertension: Secondary | ICD-10-CM

## 2016-06-28 DIAGNOSIS — F0280 Dementia in other diseases classified elsewhere without behavioral disturbance: Secondary | ICD-10-CM | POA: Diagnosis not present

## 2016-06-28 DIAGNOSIS — IMO0001 Reserved for inherently not codable concepts without codable children: Secondary | ICD-10-CM

## 2016-06-28 LAB — BAYER DCA HB A1C WAIVED: HB A1C: 6.5 % (ref <7.0)

## 2016-06-28 MED ORDER — JANUVIA 100 MG PO TABS: 100.0000 mg | ORAL_TABLET | Freq: Every day | ORAL | 2 refills | Status: DC

## 2016-06-28 MED ORDER — NAMENDA XR 28 MG PO CP24: 28.0000 mg | ORAL_CAPSULE | Freq: Every day | ORAL | 2 refills | Status: DC

## 2016-06-28 MED ORDER — DONEPEZIL HCL 10 MG PO TABS: 10.0000 mg | ORAL_TABLET | Freq: Every day | ORAL | 2 refills | Status: DC

## 2016-06-28 MED ORDER — VALSARTAN 320 MG PO TABS: 320.0000 mg | ORAL_TABLET | Freq: Every day | ORAL | 2 refills | Status: DC

## 2016-06-28 MED ORDER — ZOLPIDEM TARTRATE 10 MG PO TABS: 10.0000 mg | ORAL_TABLET | Freq: Every evening | ORAL | 5 refills | Status: DC | PRN

## 2016-06-28 MED ORDER — ROSUVASTATIN CALCIUM 20 MG PO TABS: 20.0000 mg | ORAL_TABLET | Freq: Every day | ORAL | 2 refills | Status: DC

## 2016-06-28 MED ORDER — OLANZAPINE 5 MG PO TABS: 5.0000 mg | ORAL_TABLET | Freq: Every day | ORAL | 2 refills | Status: DC

## 2016-06-28 MED ORDER — FUROSEMIDE 20 MG PO TABS: 20.0000 mg | ORAL_TABLET | Freq: Every day | ORAL | 11 refills | Status: DC

## 2016-06-28 MED ORDER — METFORMIN HCL 1000 MG PO TABS: 1000.0000 mg | ORAL_TABLET | Freq: Two times a day (BID) | ORAL | 2 refills | Status: DC

## 2016-06-28 MED ORDER — POLYSACCHARIDE IRON COMPLEX 150 MG PO CAPS: 150.0000 mg | ORAL_CAPSULE | Freq: Every day | ORAL | 2 refills | Status: DC

## 2016-06-28 MED ORDER — PANTOPRAZOLE SODIUM 40 MG PO TBEC: 40.0000 mg | DELAYED_RELEASE_TABLET | Freq: Two times a day (BID) | ORAL | 1 refills | Status: DC

## 2016-06-28 MED ORDER — NIFEDIPINE ER OSMOTIC RELEASE 90 MG PO TB24: 90.0000 mg | ORAL_TABLET | Freq: Every day | ORAL | 2 refills | Status: DC

## 2016-06-28 MED ORDER — LEVOTHYROXINE SODIUM 50 MCG PO TABS: 50.0000 ug | ORAL_TABLET | Freq: Every day | ORAL | 2 refills | Status: DC

## 2016-06-28 MED ORDER — FENOFIBRATE MICRONIZED 134 MG PO CAPS: 134.0000 mg | ORAL_CAPSULE | Freq: Every day | ORAL | 2 refills | Status: DC

## 2016-06-28 MED ORDER — ALBUTEROL SULFATE HFA 108 (90 BASE) MCG/ACT IN AERS: 2.0000 | INHALATION_SPRAY | Freq: Four times a day (QID) | RESPIRATORY_TRACT | 2 refills | Status: DC | PRN

## 2016-06-28 MED ORDER — METOPROLOL SUCCINATE ER 100 MG PO TB24: 100.0000 mg | ORAL_TABLET | Freq: Every day | ORAL | 2 refills | Status: DC

## 2016-06-28 MED ORDER — NITROGLYCERIN 0.1 MG/HR TD PT24: 0.1000 mg | MEDICATED_PATCH | Freq: Every day | TRANSDERMAL | 11 refills | Status: DC | PRN

## 2016-06-28 MED ORDER — DULOXETINE HCL 60 MG PO CPEP: 60.0000 mg | ORAL_CAPSULE | Freq: Every day | ORAL | 2 refills | Status: DC

## 2016-06-28 MED ORDER — POTASSIUM CHLORIDE CRYS ER 10 MEQ PO TBCR: 10.0000 meq | EXTENDED_RELEASE_TABLET | Freq: Two times a day (BID) | ORAL | 2 refills | Status: DC

## 2016-06-28 NOTE — Patient Instructions (Signed)
Low-Sodium Eating Plan °Sodium raises blood pressure and causes water to be held in the body. Getting less sodium from food will help lower your blood pressure, reduce any swelling, and protect your heart, liver, and kidneys. We get sodium by adding salt (sodium chloride) to food. Most of our sodium comes from canned, boxed, and frozen foods. Restaurant foods, fast foods, and pizza are also very high in sodium. Even if you take medicine to lower your blood pressure or to reduce fluid in your body, getting less sodium from your food is important. °WHAT IS MY PLAN? °Most people should limit their sodium intake to 2,300 mg a day. Your health care provider recommends that you limit your sodium intake to __________ a day.  °WHAT DO I NEED TO KNOW ABOUT THIS EATING PLAN? °For the low-sodium eating plan, you will follow these general guidelines: °· Choose foods with a % Daily Value for sodium of less than 5% (as listed on the food label).   °· Use salt-free seasonings or herbs instead of table salt or sea salt.   °· Check with your health care provider or pharmacist before using salt substitutes.   °· Eat fresh foods. °· Eat more vegetables and fruits. °· Limit canned vegetables. If you do use them, rinse them well to decrease the sodium.   °· Limit cheese to 1 oz (28 g) per day.    °· Eat lower-sodium products, often labeled as "lower sodium" or "no salt added." °· Avoid foods that contain monosodium glutamate (MSG). MSG is sometimes added to Chinese food and some canned foods.   °· Check food labels (Nutrition Facts labels) on foods to learn how much sodium is in one serving. °· Eat more home-cooked food and less restaurant, buffet, and fast food.  °· When eating at a restaurant, ask that your food be prepared with less salt, or no salt if possible.   °HOW DO I READ FOOD LABELS FOR SODIUM INFORMATION? °The Nutrition Facts label lists the amount of sodium in one serving of the food. If you eat more than one serving, you  must multiply the listed amount of sodium by the number of servings. °Food labels may also identify foods as: °· Sodium free--Less than 5 mg in a serving. °· Very low sodium--35 mg or less in a serving. °· Low sodium--140 mg or less in a serving. °· Light in sodium--50% less sodium in a serving. For example, if a food that usually has 300 mg of sodium is changed to become light in sodium, it will have 150 mg of sodium. °· Reduced sodium--25% less sodium in a serving. For example, if a food that usually has 400 mg of sodium is changed to reduced sodium, it will have 300 mg of sodium. °WHAT FOODS CAN I EAT? °Grains  °Low-sodium cereals, including oats, puffed wheat and rice, and shredded wheat cereals. Low-sodium crackers. Unsalted rice and pasta. Lower-sodium bread.  °Vegetables  °Frozen or fresh vegetables. Low-sodium or reduced-sodium canned vegetables. Low-sodium or reduced-sodium tomato sauce and paste. Low-sodium or reduced-sodium tomato and vegetable juices.  °Fruits  °Fresh, frozen, and canned fruit. Fruit juice.  °Meat and Other Protein Products  °Low-sodium canned tuna and salmon. Fresh or frozen meat, poultry, seafood, and fish. Lamb. Unsalted nuts. Dried beans, peas, and lentils without added salt. Unsalted canned beans. Homemade soups without salt. Eggs.  °Dairy  °Milk. Soy milk. Ricotta cheese. Low-sodium or reduced-sodium cheeses. Yogurt.  °Condiments  °Fresh and dried herbs and spices. Salt-free seasonings. Onion and garlic powders. Low-sodium varieties of mustard and ketchup. Fresh or refrigerated horseradish. Lemon   juice.  °Fats and Oils   °Reduced-sodium salad dressings. Unsalted butter.   °Other  °Unsalted popcorn and pretzels.  °The items listed above may not be a complete list of recommended foods or beverages. Contact your dietitian for more options. °WHAT FOODS ARE NOT RECOMMENDED? °Grains  °Instant hot cereals. Bread stuffing, pancake, and biscuit mixes. Croutons. Seasoned rice or pasta mixes.  Noodle soup cups. Boxed or frozen macaroni and cheese. Self-rising flour. Regular salted crackers. °Vegetables  °Regular canned vegetables. Regular canned tomato sauce and paste. Regular tomato and vegetable juices. Frozen vegetables in sauces. Salted French fries. Olives. Pickles. Relishes. Sauerkraut. Salsa. °Meat and Other Protein Products  °Salted, canned, smoked, spiced, or pickled meats, seafood, or fish. Bacon, ham, sausage, hot dogs, corned beef, chipped beef, and packaged luncheon meats. Salt pork. Jerky. Pickled herring. Anchovies, regular canned tuna, and sardines. Salted nuts. °Dairy  °Processed cheese and cheese spreads. Cheese curds. Blue cheese and cottage cheese. Buttermilk.  °Condiments  °Onion and garlic salt, seasoned salt, table salt, and sea salt. Canned and packaged gravies. Worcestershire sauce. Tartar sauce. Barbecue sauce. Teriyaki sauce. Soy sauce, including reduced sodium. Steak sauce. Fish sauce. Oyster sauce. Cocktail sauce. Horseradish that you find on the shelf. Regular ketchup and mustard. Meat flavorings and tenderizers. Bouillon cubes. Hot sauce. Tabasco sauce. Marinades. Taco seasonings. Relishes. °Fats and Oils   °Regular salad dressings. Salted butter. Margarine. Ghee. Bacon fat.  °Other  °Potato and tortilla chips. Corn chips and puffs. Salted popcorn and pretzels. Canned or dried soups. Pizza. Frozen entrees and pot pies.   °The items listed above may not be a complete list of foods and beverages to avoid. Contact your dietitian for more information. °  °This information is not intended to replace advice given to you by your health care provider. Make sure you discuss any questions you have with your health care provider. °  °Document Released: 02/19/2002 Document Revised: 09/20/2014 Document Reviewed: 07/04/2013 °Elsevier Interactive Patient Education ©2016 [employer] ° °

## 2016-06-28 NOTE — Telephone Encounter (Signed)
Lm to verify which medications she needed refilled since she has several different medications

## 2016-06-28 NOTE — Telephone Encounter (Signed)
Pt aware all medications have been sent to [facility]

## 2016-06-28 NOTE — Progress Notes (Signed)
BP (!) 147/77   Pulse 73   Temp 97.5 F (36.4 C) (Oral)   Ht 5' 4"  (1.626 m)   Wt 236 lb (107 kg)   BMI 40.51 kg/m    Subjective:    Patient ID: Kara Nunez, female    DOB: 18-Aug-1951, 65 y.o.   MRN: [id number]  HPI: Kara Nunez is a 65 y.o. female presenting on 06/28/2016 for Feet and leg swelling; Trouble urinating  (X 1 week ); and Follow-up   This patient comes in to be established. She is known to me from [facility]. Her past medical history is positive for coronary artery disease, essential hypertension, hyperlipidemia, uncontrolled diabetes, chronic kidney disease. She also has bipolar mood disorder with anxiety. Lastly she has degenerative disc disease. She is followed by Dr. Lowanda Foster as her nephrologist. She is seen through [facility] heart care for cardiology.   She is currently dealing with significant daily edema in the lower legs. After reviewing her medications she is not on any diuretic. She knows she has taken it in the past. Think the medication has just gotten out of cycle and she has not been on it for some time. She recently traveled. She did great increase of edema with traveling. We discussed the mechanism of creating edema in the lower legs when she sits and rides for long periods of time. We've also discussed working on a low-salt diet and information will be given to her for this. At the stomach and the need to start a fluid medication. She is also given the lab work today.   Relevant past medical, surgical, family and social history reviewed and updated as indicated. Interim medical history since our last visit reviewed. Allergies and medications reviewed and updated. DATA REVIEWED: CHART IN EPIC  Social History   Social History  . Marital status: Single    Spouse name: N/A  . Number of children: 2  . Years of education: N/A   Occupational History  . Retired     Adult nurse   Social History Main Topics  . Smoking status: Never  Smoker  . Smokeless tobacco: Never Used  . Alcohol use Yes     Comment: Rare  . Drug use: No  . Sexual activity: Not on file   Other Topics Concern  . Not on file   Social History Narrative  . No narrative on file    Past Surgical History:  Procedure Laterality Date  . CHOLECYSTECTOMY    . TUBAL LIGATION      Family History  Problem Relation Age of Onset  . Asthma Mother   . Allergies Mother   . Heart disease Mother   . Allergies Maternal Grandmother   . Heart disease Maternal Grandmother   . Breast cancer Maternal Grandmother   . Heart disease Maternal Grandfather   . Heart disease Paternal Grandfather   . Heart disease Paternal Grandmother     Review of Systems  Constitutional: Positive for fatigue and unexpected weight change. Negative for activity change and fever.  HENT: Negative.   Eyes: Negative.   Respiratory: Negative.  Negative for cough, shortness of breath and wheezing.   Cardiovascular: Positive for leg swelling. Negative for chest pain and palpitations.  Gastrointestinal: Negative.  Negative for abdominal pain.  Endocrine: Negative.   Genitourinary: Negative.  Negative for dysuria, flank pain, frequency and hematuria.  Musculoskeletal: Negative.   Skin: Negative.   Neurological: Negative.       Medication List  Accurate as of 06/28/16  9:21 AM. Always use your most recent med list.          ALPRAZolam 0.5 MG tablet Commonly known as:  XANAX Take 0.5 mg by mouth 3 (three) times daily as needed.   donepezil 10 MG tablet Commonly known as:  ARICEPT Take 10 mg by mouth at bedtime.   DULoxetine 60 MG capsule Commonly known as:  CYMBALTA Take 60 mg by mouth daily.   fenofibrate micronized 134 MG capsule Commonly known as:  LOFIBRA Take 134 mg by mouth daily before breakfast.   furosemide 20 MG tablet Commonly known as:  LASIX Take 1 tablet (20 mg total) by mouth daily.   HYDROcodone-acetaminophen 10-325 MG tablet Commonly known  as:  NORCO Take 1 tablet by mouth every 6 (six) hours as needed.   iron polysaccharides 150 MG capsule Commonly known as:  NIFEREX Take 150 mg by mouth daily.   JANUVIA 100 MG tablet Generic drug:  sitaGLIPtin Take 100 mg by mouth daily.   levothyroxine 50 MCG tablet Commonly known as:  SYNTHROID, LEVOTHROID Take 50 mcg by mouth daily before breakfast.   metFORMIN 1000 MG tablet Commonly known as:  GLUCOPHAGE Take 1 tablet (1,000 mg total) by mouth 2 (two) times daily with a meal. Hold until 02/23/12; then resume use as previously prescribed.   metoprolol succinate 100 MG 24 hr tablet Commonly known as:  TOPROL-XL Take 100 mg by mouth daily.   NAMENDA XR 28 MG Cp24 24 hr capsule Generic drug:  memantine   NIFEdipine 90 MG 24 hr tablet Commonly known as:  PROCARDIA XL/ADALAT-CC Take 90 mg by mouth daily.   nitroGLYCERIN 0.1 mg/hr patch Commonly known as:  NITRODUR - Dosed in mg/24 hr Place 1 patch onto the skin daily as needed.   OLANZapine 5 MG tablet Commonly known as:  ZYPREXA Take 5 mg by mouth at bedtime.   pantoprazole 40 MG tablet Commonly known as:  PROTONIX Take 1 tablet (40 mg total) by mouth 2 (two) times daily. Take 30-60 minutes before the first meal of the day   potassium chloride 10 MEQ tablet Commonly known as:  K-DUR,KLOR-CON Take 10 mEq by mouth 2 (two) times daily.   PROAIR HFA 108 (90 Base) MCG/ACT inhaler Generic drug:  albuterol Inhale 2 puffs into the lungs every 6 (six) hours as needed.   rosuvastatin 20 MG tablet Commonly known as:  CRESTOR Take 20 mg by mouth daily.   valsartan 320 MG tablet Commonly known as:  DIOVAN Take 320 mg by mouth daily.   zolpidem 10 MG tablet Commonly known as:  AMBIEN Take 10 mg by mouth at bedtime as needed.          Objective:    BP (!) 147/77   Pulse 73   Temp 97.5 F (36.4 C) (Oral)   Ht 5' 4"  (1.626 m)   Wt 236 lb (107 kg)   BMI 40.51 kg/m   Allergies  Allergen Reactions  . Codeine  Nausea Only  . Amoxicillin Rash    Wt Readings from Last 3 Encounters:  06/28/16 236 lb (107 kg)  12/24/15 236 lb (107 kg)  10/07/15 242 lb (109.8 kg)    Physical Exam  Constitutional: She is oriented to person, place, and time. She appears well-developed and well-nourished.  HENT:  Head: Normocephalic and atraumatic.  Eyes: Conjunctivae and EOM are normal. Pupils are equal, round, and reactive to light.  Neck: Normal range of motion. Neck supple.  Cardiovascular: Normal rate, regular rhythm, normal heart sounds, intact distal pulses and normal pulses.   1+ edema pretibial.  Pulmonary/Chest: Effort normal and breath sounds normal. No respiratory distress.  Abdominal: Soft. Bowel sounds are normal.  Neurological: She is alert and oriented to person, place, and time. She has normal reflexes.  Skin: Skin is warm and dry. No rash noted.  Psychiatric: She has a normal mood and affect. Her behavior is normal. Judgment and thought content normal.        Assessment & Plan:   1. Essential hypertension, benign - furosemide (LASIX) 20 MG tablet; Take 1 tablet (20 mg total) by mouth daily.  Dispense: 30 tablet; Refill: 11 - CMP14+EGFR  2. Chronic respiratory failure with hypoxia (HCC)  3. Uncontrolled type 2 diabetes mellitus without complication, without long-term current use of insulin (HCC) - Bayer DCA Hb A1c Waived - CMP14+EGFR  4. Morbid obesity ([place])  5. Mixed hyperlipidemia - Lipid panel  6. Late onset Alzheimer's disease without behavioral disturbance  7. DDD (degenerative disc disease), lumbar  8. Localized edema - furosemide (LASIX) 20 MG tablet; Take 1 tablet (20 mg total) by mouth daily.  Dispense: 30 tablet; Refill: 11  9. Stage 3 chronic kidney disease - CMP14+EGFR - Microalbumin / creatinine urine ratio   Continue all other maintenance medications as listed above.  Follow up plan: Return in about 2 weeks (around 07/12/2016) for edema, HTN.  Orders Placed  This Encounter  Procedures  . Bayer DCA Hb A1c Waived  . CMP14+EGFR  . Lipid panel  . Microalbumin / creatinine urine ratio    Educational handout given for low salt diet  Terald Sleeper PA-C [place] [address]  [place], [place] [address] [phone]   06/28/2016, 9:21 AM

## 2016-06-28 NOTE — Telephone Encounter (Signed)
prepared

## 2016-06-28 NOTE — Telephone Encounter (Signed)
Zolpidem called in to [facility]

## 2016-06-29 LAB — CMP14+EGFR
ALT: 57 IU/L — AB (ref 0–32)
AST: 48 IU/L — ABNORMAL HIGH (ref 0–40)
Albumin/Globulin Ratio: 1.6 (ref 1.2–2.2)
Albumin: 3.9 g/dL (ref 3.6–4.8)
Alkaline Phosphatase: 49 IU/L (ref 39–117)
BILIRUBIN TOTAL: 0.3 mg/dL (ref 0.0–1.2)
BUN/Creatinine Ratio: 15 (ref 12–28)
BUN: 14 mg/dL (ref 8–27)
CALCIUM: 9.7 mg/dL (ref 8.7–10.3)
CHLORIDE: 102 mmol/L (ref 96–106)
CO2: 24 mmol/L (ref 18–29)
Creatinine, Ser: 0.95 mg/dL (ref 0.57–1.00)
GFR calc non Af Amer: 62 mL/min/{1.73_m2} (ref >59)
GFR, EST AFRICAN AMERICAN: 72 mL/min/{1.73_m2} (ref >59)
GLUCOSE: 182 mg/dL — AB (ref 65–99)
Globulin, Total: 2.5 g/dL (ref 1.5–4.5)
Potassium: 4.1 mmol/L (ref 3.5–5.2)
Sodium: 142 mmol/L (ref 134–144)
TOTAL PROTEIN: 6.4 g/dL (ref 6.0–8.5)

## 2016-06-29 LAB — LIPID PANEL
Chol/HDL Ratio: 2.6 ratio units (ref 0.0–4.4)
Cholesterol, Total: 133 mg/dL (ref 100–199)
HDL: 52 mg/dL (ref >39)
LDL Calculated: 44 mg/dL (ref 0–99)
Triglycerides: 183 mg/dL — ABNORMAL HIGH (ref 0–149)
VLDL CHOLESTEROL CAL: 37 mg/dL (ref 5–40)

## 2016-06-29 LAB — MICROALBUMIN / CREATININE URINE RATIO
CREATININE, UR: 79.7 mg/dL
Microalb/Creat Ratio: 2509 mg/g creat — ABNORMAL HIGH (ref 0.0–30.0)
Microalbumin, Urine: 1999.7 ug/mL

## 2016-07-05 ENCOUNTER — Telehealth: Payer: Self-pay | Admitting: *Deleted

## 2016-07-05 MED ORDER — SULFAMETHOXAZOLE-TRIMETHOPRIM 800-160 MG PO TABS: 1.0000 | ORAL_TABLET | Freq: Two times a day (BID) | ORAL | 0 refills | Status: DC

## 2016-07-05 NOTE — Telephone Encounter (Signed)
Pt is in the bed. Husband did not know exactly what sxs she was having. Says the edema in her feet have gone down some, ran a fever for a couple days, bloated, says she thinks its her kidneys

## 2016-07-05 NOTE — Telephone Encounter (Signed)
Sent med

## 2016-07-06 NOTE — Telephone Encounter (Signed)
Patient aware that antibiotic sent in

## 2016-07-19 DIAGNOSIS — D509 Iron deficiency anemia, unspecified: Secondary | ICD-10-CM | POA: Diagnosis not present

## 2016-07-19 DIAGNOSIS — I10 Essential (primary) hypertension: Secondary | ICD-10-CM | POA: Diagnosis not present

## 2016-07-19 DIAGNOSIS — N183 Chronic kidney disease, stage 3 (moderate): Secondary | ICD-10-CM | POA: Diagnosis not present

## 2016-07-19 DIAGNOSIS — E559 Vitamin D deficiency, unspecified: Secondary | ICD-10-CM | POA: Diagnosis not present

## 2016-07-19 DIAGNOSIS — Z79899 Other long term (current) drug therapy: Secondary | ICD-10-CM | POA: Diagnosis not present

## 2016-07-19 DIAGNOSIS — R809 Proteinuria, unspecified: Secondary | ICD-10-CM | POA: Diagnosis not present

## 2016-07-21 DIAGNOSIS — R809 Proteinuria, unspecified: Secondary | ICD-10-CM | POA: Diagnosis not present

## 2016-07-21 DIAGNOSIS — I10 Essential (primary) hypertension: Secondary | ICD-10-CM | POA: Diagnosis not present

## 2016-07-21 DIAGNOSIS — N183 Chronic kidney disease, stage 3 (moderate): Secondary | ICD-10-CM | POA: Diagnosis not present

## 2016-07-24 DIAGNOSIS — N183 Chronic kidney disease, stage 3 (moderate): Secondary | ICD-10-CM | POA: Diagnosis not present

## 2016-07-24 DIAGNOSIS — R809 Proteinuria, unspecified: Secondary | ICD-10-CM | POA: Diagnosis not present

## 2016-07-24 DIAGNOSIS — I10 Essential (primary) hypertension: Secondary | ICD-10-CM | POA: Diagnosis not present

## 2016-07-28 ENCOUNTER — Telehealth: Payer: Self-pay | Admitting: Physician Assistant

## 2016-07-28 ENCOUNTER — Other Ambulatory Visit: Payer: Self-pay | Admitting: Physician Assistant

## 2016-07-28 MED ORDER — ALPRAZOLAM 0.5 MG PO TABS: 0.5000 mg | ORAL_TABLET | Freq: Three times a day (TID) | ORAL | 5 refills | Status: DC | PRN

## 2016-07-28 NOTE — Telephone Encounter (Signed)
sent 

## 2016-07-28 NOTE — Telephone Encounter (Signed)
Last seen 06/28/16. Patient aware that she should have refills on Ambien since it was filled 06/28/16 #30 R-5. Patient would also like a refill on her xanax called into [facility]. Please advise and send back to the pools.

## 2016-07-29 NOTE — Telephone Encounter (Signed)
[facility] - [facility]m for pt

## 2016-08-25 DIAGNOSIS — I10 Essential (primary) hypertension: Secondary | ICD-10-CM | POA: Diagnosis not present

## 2016-08-25 DIAGNOSIS — R809 Proteinuria, unspecified: Secondary | ICD-10-CM | POA: Diagnosis not present

## 2016-08-25 DIAGNOSIS — N183 Chronic kidney disease, stage 3 (moderate): Secondary | ICD-10-CM | POA: Diagnosis not present

## 2016-08-26 ENCOUNTER — Other Ambulatory Visit: Payer: Self-pay | Admitting: Physician Assistant

## 2016-09-01 ENCOUNTER — Other Ambulatory Visit: Payer: Self-pay | Admitting: Physician Assistant

## 2016-09-01 NOTE — Telephone Encounter (Signed)
Patient last seen in office on 10-16. Rx last filled on 07-28-16. Please advise. If approved rx will print

## 2016-09-02 ENCOUNTER — Other Ambulatory Visit (HOSPITAL_COMMUNITY): Payer: Self-pay | Admitting: Nephrology

## 2016-09-02 DIAGNOSIS — R809 Proteinuria, unspecified: Secondary | ICD-10-CM

## 2016-09-08 ENCOUNTER — Other Ambulatory Visit: Payer: Self-pay | Admitting: Thoracic Surgery (Cardiothoracic Vascular Surgery)

## 2016-09-08 DIAGNOSIS — I712 Thoracic aortic aneurysm, without rupture: Secondary | ICD-10-CM

## 2016-09-08 DIAGNOSIS — I7121 Aneurysm of the ascending aorta, without rupture: Secondary | ICD-10-CM

## 2016-09-10 DIAGNOSIS — G4733 Obstructive sleep apnea (adult) (pediatric): Secondary | ICD-10-CM | POA: Diagnosis not present

## 2016-09-17 ENCOUNTER — Other Ambulatory Visit: Payer: Self-pay | Admitting: Radiology

## 2016-09-17 ENCOUNTER — Other Ambulatory Visit: Payer: Self-pay | Admitting: Physician Assistant

## 2016-09-20 ENCOUNTER — Ambulatory Visit (HOSPITAL_COMMUNITY)
Admission: RE | Admit: 2016-09-20 | Discharge: 2016-09-20 | Disposition: A | Discharge status: Home / Self Care | Payer: Medicare HMO | Source: Ambulatory Visit | Attending: Nephrology | Admitting: Nephrology

## 2016-09-20 ENCOUNTER — Other Ambulatory Visit: Payer: Self-pay | Admitting: Radiology

## 2016-09-20 ENCOUNTER — Encounter (HOSPITAL_COMMUNITY): Payer: Self-pay

## 2016-09-20 DIAGNOSIS — G4700 Insomnia, unspecified: Secondary | ICD-10-CM | POA: Diagnosis not present

## 2016-09-20 DIAGNOSIS — E669 Obesity, unspecified: Secondary | ICD-10-CM | POA: Insufficient documentation

## 2016-09-20 DIAGNOSIS — G8929 Other chronic pain: Secondary | ICD-10-CM | POA: Diagnosis not present

## 2016-09-20 DIAGNOSIS — Z880 Allergy status to penicillin: Secondary | ICD-10-CM | POA: Diagnosis not present

## 2016-09-20 DIAGNOSIS — Z79899 Other long term (current) drug therapy: Secondary | ICD-10-CM | POA: Insufficient documentation

## 2016-09-20 DIAGNOSIS — Z6841 Body Mass Index (BMI) 40.0 and over, adult: Secondary | ICD-10-CM | POA: Diagnosis not present

## 2016-09-20 DIAGNOSIS — I712 Thoracic aortic aneurysm, without rupture: Secondary | ICD-10-CM | POA: Insufficient documentation

## 2016-09-20 DIAGNOSIS — Z7982 Long term (current) use of aspirin: Secondary | ICD-10-CM | POA: Insufficient documentation

## 2016-09-20 DIAGNOSIS — R809 Proteinuria, unspecified: Secondary | ICD-10-CM

## 2016-09-20 DIAGNOSIS — E785 Hyperlipidemia, unspecified: Secondary | ICD-10-CM | POA: Diagnosis not present

## 2016-09-20 DIAGNOSIS — I2510 Atherosclerotic heart disease of native coronary artery without angina pectoris: Secondary | ICD-10-CM | POA: Insufficient documentation

## 2016-09-20 DIAGNOSIS — E1122 Type 2 diabetes mellitus with diabetic chronic kidney disease: Secondary | ICD-10-CM | POA: Insufficient documentation

## 2016-09-20 DIAGNOSIS — N183 Chronic kidney disease, stage 3 (moderate): Secondary | ICD-10-CM | POA: Insufficient documentation

## 2016-09-20 DIAGNOSIS — Z885 Allergy status to narcotic agent status: Secondary | ICD-10-CM | POA: Insufficient documentation

## 2016-09-20 DIAGNOSIS — Z7984 Long term (current) use of oral hypoglycemic drugs: Secondary | ICD-10-CM | POA: Insufficient documentation

## 2016-09-20 DIAGNOSIS — Z8249 Family history of ischemic heart disease and other diseases of the circulatory system: Secondary | ICD-10-CM | POA: Diagnosis not present

## 2016-09-20 DIAGNOSIS — M549 Dorsalgia, unspecified: Secondary | ICD-10-CM | POA: Diagnosis not present

## 2016-09-20 DIAGNOSIS — I129 Hypertensive chronic kidney disease with stage 1 through stage 4 chronic kidney disease, or unspecified chronic kidney disease: Secondary | ICD-10-CM | POA: Insufficient documentation

## 2016-09-20 DIAGNOSIS — Z825 Family history of asthma and other chronic lower respiratory diseases: Secondary | ICD-10-CM | POA: Diagnosis not present

## 2016-09-20 LAB — BASIC METABOLIC PANEL
Anion gap: 10 (ref 5–15)
BUN: 21 mg/dL — AB (ref 6–20)
CALCIUM: 10 mg/dL (ref 8.9–10.3)
CO2: 26 mmol/L (ref 22–32)
CREATININE: 1.18 mg/dL — AB (ref 0.44–1.00)
Chloride: 106 mmol/L (ref 101–111)
GFR calc non Af Amer: 47 mL/min — ABNORMAL LOW (ref >60)
GFR, EST AFRICAN AMERICAN: 54 mL/min — AB (ref >60)
GLUCOSE: 134 mg/dL — AB (ref 65–99)
Potassium: 3.9 mmol/L (ref 3.5–5.1)
Sodium: 142 mmol/L (ref 135–145)

## 2016-09-20 LAB — CBC
HCT: 40 % (ref 36.0–46.0)
Hemoglobin: 13 g/dL (ref 12.0–15.0)
MCH: 27.8 pg (ref 26.0–34.0)
MCHC: 32.5 g/dL (ref 30.0–36.0)
MCV: 85.5 fL (ref 78.0–100.0)
PLATELETS: 287 10*3/uL (ref 150–400)
RBC: 4.68 MIL/uL (ref 3.87–5.11)
RDW: 14.7 % (ref 11.5–15.5)
WBC: 7.2 10*3/uL (ref 4.0–10.5)

## 2016-09-20 LAB — GLUCOSE, CAPILLARY: Glucose-Capillary: 131 mg/dL — ABNORMAL HIGH (ref 65–99)

## 2016-09-20 LAB — PROTIME-INR
INR: 0.94
PROTHROMBIN TIME: 12.5 s (ref 11.4–15.2)

## 2016-09-20 LAB — APTT: aPTT: 26 seconds (ref 24–36)

## 2016-09-20 MED ORDER — LIDOCAINE HCL 1 % IJ SOLN
INTRAMUSCULAR | Status: AC
  Filled 2016-09-20: qty 20

## 2016-09-20 MED ORDER — FENTANYL CITRATE (PF) 100 MCG/2ML IJ SOLN
INTRAMUSCULAR | Status: AC
  Filled 2016-09-20: qty 2

## 2016-09-20 MED ORDER — MIDAZOLAM HCL 2 MG/2ML IJ SOLN
INTRAMUSCULAR | Status: AC
  Filled 2016-09-20: qty 2

## 2016-09-20 MED ORDER — MIDAZOLAM HCL 2 MG/2ML IJ SOLN
INTRAMUSCULAR | Status: AC | PRN
  Administered 2016-09-20 (×2): 1 mg via INTRAVENOUS

## 2016-09-20 MED ORDER — SODIUM CHLORIDE 0.9 % IV SOLN: INTRAVENOUS | Status: DC

## 2016-09-20 MED ORDER — FENTANYL CITRATE (PF) 100 MCG/2ML IJ SOLN
INTRAMUSCULAR | Status: AC | PRN
  Administered 2016-09-20 (×2): 50 ug via INTRAVENOUS

## 2016-09-20 NOTE — Sedation Documentation (Signed)
Patient denies pain and is resting comfortably.  

## 2016-09-20 NOTE — Procedures (Signed)
R renal Bx Random core 16 g times two No comp/EBL

## 2016-09-20 NOTE — Discharge Instructions (Signed)
Percutaneous Kidney Biopsy, Care After °This sheet gives you information about how to care for yourself after your procedure. Your health care provider may also give you more specific instructions. If you have problems or questions, contact your health care provider. °What can I expect after the procedure? °After the procedure, it is common to have: °· Pain or soreness near the area where the needle went through your skin (biopsy site). °· Bright pink or cloudy urine for 24 hours after the procedure. °Follow these instructions at home: °Activity  °· Return to your normal activities as told by your health care provider. Ask your health care provider what activities are safe for you. °· Do not drive for 24 hours if you were given a medicine to help you relax (sedative). °· Do not lift anything that is heavier than 10 lb (4.5 kg) until your health care provider tells you that it is safe. °· Avoid activities that take a lot of effort (are strenuous) until your health care provider approves. Most people will have to wait 2 weeks before returning to activities such as exercise or sexual intercourse. °General instructions  °· Take over-the-counter and prescription medicines only as told by your health care provider. °· You may eat and drink after your procedure. Follow instructions from your health care provider about eating or drinking restrictions. °· Check your biopsy site every day for signs of infection. Check for: °¨ More redness, swelling, or pain. °¨ More fluid or blood. °¨ Warmth. °¨ Pus or a bad smell. °· Keep all follow-up visits as told by your health care provider. This is important. °Contact a health care provider if: °· You have more redness, swelling, or pain around your biopsy site. °· You have more fluid or blood coming from your biopsy site. °· Your biopsy site feels warm to the touch. °· You have pus or a bad smell coming from your biopsy site. °· You have blood in your urine more than 24 hours after  your procedure. °Get help right away if: °· You have dark red or brown urine. °· You have a fever. °· You are unable to urinate. °· You feel burning when you urinate. °· You feel faint. °· You have severe pain in your abdomen or side. °This information is not intended to replace advice given to you by your health care provider. Make sure you discuss any questions you have with your health care provider. °Document Released: 05/02/2013 Document Revised: 06/11/2016 Document Reviewed: 06/11/2016 °Elsevier Interactive Patient Education © 2017 [employer]vier Inc. ° °

## 2016-09-20 NOTE — H&P (Signed)
Chief Complaint: Patient was seen in consultation today for random renal biopsy at the request of [place]  Referring Physician(s): Fran Lowes  Supervising Physician: Marybelle Killings  Patient Status: [facility] - Out-pt  History of Present Illness: Kara Nunez is a 66 y.o. female   Hx DM; CKD3 Obesity Known proteinuria for 2-3 years Followed by Dr Lowanda Foster Worsening proteinuria Now requesting random renal biopsy   Past Medical History:  Diagnosis Date  . Allergic rhinitis   . Ascending aortic aneurysm ([place])    Followed by Dr. [name]  . Chronic back pain   . Coronary atherosclerosis of native coronary artery    Prior evaluation with in [facility] - details not clear   . Degenerative disc disease   . Essential hypertension, benign   . Hyperlipidemia   . Insomnia   . Type 2 diabetes mellitus ([place])     Past Surgical History:  Procedure Laterality Date  . CHOLECYSTECTOMY    . TUBAL LIGATION      Allergies: Codeine and Amoxicillin  Medications: Prior to Admission medications   Medication Sig Start Date End Date Taking? Authorizing Provider  ALPRAZolam Duanne Moron) 0.5 MG tablet Take 1 tablet (0.5 mg total) by mouth 3 (three) times daily as needed. 07/28/16  Yes Terald Sleeper, PA-C  aspirin EC 81 MG tablet Take 81 mg by mouth daily.   Yes Historical Provider, MD  donepezil (ARICEPT) 10 MG tablet TAKE ONE TABLET DAILY AT BEDTIME 08/30/16  Yes Terald Sleeper, PA-C  DULoxetine (CYMBALTA) 60 MG capsule TAKE ONE (1) CAPSULE EACH DAY 08/30/16  Yes Terald Sleeper, PA-C  fenofibrate micronized (LOFIBRA) 134 MG capsule TAKE 1 CAPSULE EVERY MORNING BEFORE BREAKFAST 08/30/16  Yes Terald Sleeper, PA-C  fluorouracil (EFUDEX) 5 % cream Apply 1 application topically 2 (two) times daily as needed (Use on face).   Yes Historical Provider, MD  furosemide (LASIX) 20 MG tablet Take 1 tablet (20 mg total) by mouth daily. 06/28/16  Yes Terald Sleeper, PA-C    HYDROcodone-acetaminophen (NORCO) 10-325 MG tablet TAKE ONE (1) TABLET THREE (3) TIMES EACH DAY Patient taking differently: TAKE ONE (1) TABLET THREE (3) TIMES EACH DAY AS NEEDED 09/01/16  Yes Terald Sleeper, PA-C  IFEREX 150 150 MG capsule TAKE ONE (1) CAPSULE EACH DAY 08/30/16  Yes Terald Sleeper, PA-C  JANUVIA 100 MG tablet TAKE ONE (1) CAPSULE EACH DAY 08/30/16  Yes Terald Sleeper, PA-C  levothyroxine (SYNTHROID, LEVOTHROID) 50 MCG tablet TAKE ONE TABLET EACH MORNING BEFORE BREAKFAST 08/30/16  Yes Terald Sleeper, PA-C  metFORMIN (GLUCOPHAGE) 1000 MG tablet TAKE ONE TABLET TWICE A DAY WITH FOOD 08/30/16  Yes Terald Sleeper, PA-C  metoprolol succinate (TOPROL-XL) 100 MG 24 hr tablet TAKE ONE (1) TABLET EACH DAY 08/30/16  Yes Terald Sleeper, PA-C  NAMENDA XR 28 MG CP24 24 hr capsule TAKE ONE (1) CAPSULE EACH DAY 08/30/16  Yes Terald Sleeper, PA-C  NIFEdipine (PROCARDIA XL/ADALAT-CC) 90 MG 24 hr tablet TAKE ONE (1) TABLET EACH DAY 08/30/16  Yes Terald Sleeper, PA-C  nitroGLYCERIN (NITRODUR - DOSED IN MG/24 HR) 0.1 mg/hr patch Place 1 patch (0.1 mg total) onto the skin daily as needed. 06/28/16  Yes Terald Sleeper, PA-C  OLANZapine (ZYPREXA) 5 MG tablet TAKE ONE TABLET DAILY AT BEDTIME 08/30/16  Yes Terald Sleeper, PA-C  pantoprazole (PROTONIX) 40 MG tablet TAKE ONE TABLET BY MOUTH TWICE DAILY 07/28/16  Yes Terald Sleeper, PA-C  potassium  chloride (K-DUR) 10 MEQ tablet TAKE ONE TABLET BY MOUTH TWICE DAILY 08/30/16  Yes Terald Sleeper, PA-C  PROAIR HFA 108 [phone] Base) MCG/ACT inhaler USE 2 PUFFS EVERY 6 HOURS AS NEEDED 08/30/16  Yes Terald Sleeper, PA-C  rosuvastatin (CRESTOR) 20 MG tablet TAKE ONE (1) TABLET EACH DAY 08/30/16  Yes Terald Sleeper, PA-C  valsartan (DIOVAN) 320 MG tablet TAKE ONE (1) TABLET EACH DAY 08/30/16  Yes Terald Sleeper, PA-C  Vitamin D, Ergocalciferol, (DRISDOL) 50000 units CAPS capsule Take 50,000 Units by mouth 2 (two) times a week.   Yes Historical Provider, MD  zolpidem (AMBIEN) 10 MG  tablet Take 1 tablet (10 mg total) by mouth at bedtime as needed. 06/28/16  Yes Terald Sleeper, PA-C     Family History  Problem Relation Age of Onset  . Asthma Mother   . Allergies Mother   . Heart disease Mother   . Allergies Maternal Grandmother   . Heart disease Maternal Grandmother   . Breast cancer Maternal Grandmother   . Heart disease Maternal Grandfather   . Heart disease Paternal Grandfather   . Heart disease Paternal Grandmother     Social History   Social History  . Marital status: Single    Spouse name: N/A  . Number of children: 2  . Years of education: N/A   Occupational History  . Retired     Adult nurse   Social History Main Topics  . Smoking status: Never Smoker  . Smokeless tobacco: Never Used  . Alcohol use Yes     Comment: Rare  . Drug use: No  . Sexual activity: Not Asked   Other Topics Concern  . None   Social History Narrative  . None    Review of Systems: A 12 point ROS discussed and pertinent positives are indicated in the HPI above.  All other systems are negative.  Review of Systems  Constitutional: Negative for activity change, appetite change, fatigue and fever.  Respiratory: Negative for cough.   Gastrointestinal: Negative for abdominal pain.  Neurological: Negative for weakness.  Psychiatric/Behavioral: Negative for behavioral problems and confusion.    Vital Signs: BP (!) 168/86 (BP Location: Right Arm)   Pulse 62   Temp 98.4 F (36.9 C) (Oral)   Resp 18   Ht 5\' 4"  (1.626 m)   Wt 236 lb (107 kg)   SpO2 95%   BMI 40.51 kg/m   Physical Exam  Constitutional: She is oriented to person, place, and time.  Cardiovascular: Normal rate, regular rhythm and normal heart sounds.   Pulmonary/Chest: Effort normal and breath sounds normal. She has no wheezes.  Abdominal: Soft. Bowel sounds are normal. There is no tenderness.  Musculoskeletal: Normal range of motion.  Neurological: She is alert and oriented to person,  place, and time.  Skin: Skin is warm and dry.  Psychiatric: She has a normal mood and affect. Her behavior is normal. Judgment and thought content normal.  Nursing note and vitals reviewed.   Mallampati Score:  MD Evaluation Airway: WNL Heart: WNL Abdomen: WNL Chest/ Lungs: WNL ASA  Classification: 3 Mallampati/Airway Score: One  Imaging: No results found.  Labs:  CBC: No results for input(s): WBC, HGB, HCT, PLT in the last 8760 hours.  COAGS:  Recent Labs  09/20/16 0618  INR 0.94  APTT 26    BMP:  Recent Labs  10/07/15 0950 06/28/16 0928 09/20/16 0618  NA  --  142 142  K  --  4.1 3.9  CL  --  102 106  CO2  --  24 26  GLUCOSE  --  182* 134*  BUN  --  14 21*  CALCIUM  --  9.7 10.0  CREATININE 1.0 0.95 1.18*  GFRNONAA  --  62 47*  GFRAA  --  72 54*    LIVER FUNCTION TESTS:  Recent Labs  06/28/16 0928  BILITOT 0.3  AST 48*  ALT 57*  ALKPHOS 49  PROT 6.4  ALBUMIN 3.9    TUMOR MARKERS: No results for input(s): AFPTM, CEA, CA199, CHROMGRNA in the last 8760 hours.  Assessment and Plan:  CKD III Worsening proteinuria Scheduled for random renal biopsy Risks and Benefits discussed with the patient including, but not limited to bleeding, infection, damage to adjacent structures or low yield requiring additional tests. All of the patient's questions were answered, patient is agreeable to proceed. Consent signed and in chart.   Thank you for this interesting consult.  I greatly enjoyed meeting Kara Nunez and look forward to participating in their care.  A copy of this report was sent to the requesting provider on this date.  Electronically Signed: Dmarcus Decicco A 09/20/2016, 7:39 AM   I spent a total of  30 Minutes   in face to face in clinical consultation, greater than 50% of which was counseling/coordinating care for random renal bx

## 2016-09-21 ENCOUNTER — Other Ambulatory Visit: Payer: Self-pay | Admitting: Physician Assistant

## 2016-09-22 NOTE — Telephone Encounter (Signed)
Do you want her to continue with rx Vitamin D? Please advise and route to [facility]

## 2016-09-22 NOTE — Telephone Encounter (Signed)
Vitamin D 50000 IU twice weekly #8, prn refills

## 2016-09-24 MED ORDER — VITAMIN D (ERGOCALCIFEROL) 1.25 MG (50000 UNIT) PO CAPS: 50000.0000 [IU] | ORAL_CAPSULE | ORAL | 2 refills | Status: DC

## 2016-09-24 NOTE — Telephone Encounter (Signed)
rx sent to pharmacy

## 2016-09-27 ENCOUNTER — Other Ambulatory Visit: Payer: Self-pay | Admitting: Physician Assistant

## 2016-09-29 NOTE — Telephone Encounter (Signed)
Hydrocodone last filled 09/01/2016. Alprazolam last filled 08/26/2016. Please review

## 2016-09-30 NOTE — Telephone Encounter (Signed)
Must have appointment.

## 2016-10-01 NOTE — Telephone Encounter (Signed)
Detailed message left for patient that rx is ready to be picked up. Patient has appointment 2/12.

## 2016-10-04 ENCOUNTER — Other Ambulatory Visit: Payer: Self-pay | Admitting: Physician Assistant

## 2016-10-04 ENCOUNTER — Other Ambulatory Visit: Payer: Self-pay | Admitting: Thoracic Surgery (Cardiothoracic Vascular Surgery)

## 2016-10-04 NOTE — Telephone Encounter (Signed)
LMTCB/ww. Prescription is up front for patient to pick up

## 2016-10-05 ENCOUNTER — Ambulatory Visit (INDEPENDENT_AMBULATORY_CARE_PROVIDER_SITE_OTHER): Payer: Medicare HMO | Admitting: Thoracic Surgery (Cardiothoracic Vascular Surgery)

## 2016-10-05 ENCOUNTER — Ambulatory Visit
Admission: RE | Admit: 2016-10-05 | Discharge: 2016-10-05 | Disposition: A | Discharge status: Home / Self Care | Payer: Medicare HMO | Source: Ambulatory Visit | Attending: Thoracic Surgery (Cardiothoracic Vascular Surgery) | Admitting: Thoracic Surgery (Cardiothoracic Vascular Surgery)

## 2016-10-05 ENCOUNTER — Encounter: Payer: Self-pay | Admitting: Thoracic Surgery (Cardiothoracic Vascular Surgery)

## 2016-10-05 VITALS — BP 190/92 | HR 55 | Resp 16 | Ht 64.0 in | Wt 236.0 lb

## 2016-10-05 DIAGNOSIS — I712 Thoracic aortic aneurysm, without rupture: Secondary | ICD-10-CM

## 2016-10-05 DIAGNOSIS — I7121 Aneurysm of the ascending aorta, without rupture: Secondary | ICD-10-CM

## 2016-10-05 DIAGNOSIS — R911 Solitary pulmonary nodule: Secondary | ICD-10-CM | POA: Diagnosis not present

## 2016-10-05 MED ORDER — IOPAMIDOL (ISOVUE-370) INJECTION 76%
75.0000 mL | Freq: Once | INTRAVENOUS | Status: AC | PRN
  Administered 2016-10-05: 75 mL via INTRAVENOUS

## 2016-10-05 NOTE — Progress Notes (Signed)
[id number]       ,[place] [id number]             [phone]       HPI: Kara Nunez returns for follow-up of her ascending aneurysm.  Kara Nunez is a 66 year old woman with a past medical history of morbid obesity, type II diabetes, hypertension, hyperlipidemia, coronary artery disease, and chronic respiratory failure who I have been following for a 4.4 cm ascending aneurysm since 2012. I last saw her in the office a year ago. At that time her aneurysm was unchanged.  Over the past year she denies any new medical problems. She says she has been taking her blood pressure medication. She started to do water aerobics but then "got out of the habit." She denies any chest pain, pressure, or tightness.  Past Medical History:  Diagnosis Date  . Allergic rhinitis   . Ascending aortic aneurysm ([place])    Followed by Dr. [name]  . Chronic back pain   . Coronary atherosclerosis of native coronary artery    Prior evaluation with in [facility] - details not clear   . Degenerative disc disease   . Essential hypertension, benign   . Hyperlipidemia   . Insomnia   . Type 2 diabetes mellitus ([place])      Current Outpatient Prescriptions  Medication Sig Dispense Refill  . ALPRAZolam (XANAX) 0.5 MG tablet TAKE ONE TABLET 3 TIMES A DAY AS NEEDED. 90 tablet 0  . aspirin EC 81 MG tablet Take 81 mg by mouth daily.    Marland Kitchen donepezil (ARICEPT) 10 MG tablet TAKE ONE TABLET DAILY AT BEDTIME 30 tablet 3  . DULoxetine (CYMBALTA) 60 MG capsule TAKE ONE (1) CAPSULE EACH DAY 30 capsule 1  . fenofibrate micronized (LOFIBRA) 134 MG capsule TAKE 1 CAPSULE EVERY MORNING BEFORE BREAKFAST 30 capsule 3  . fluorouracil (EFUDEX) 5 % cream Apply 1 application topically 2 (two) times daily as needed (Use on face).    . furosemide (LASIX) 20 MG tablet Take 1 tablet (20 mg total) by mouth daily. 30 tablet 11  . HYDROcodone-acetaminophen (NORCO) 10-325 MG tablet TAKE ONE (1) TABLET THREE (3) TIMES  EACH DAY 90 tablet 0  . IFEREX 150 150 MG capsule TAKE ONE (1) CAPSULE EACH DAY 30 capsule 3  . JANUVIA 100 MG tablet TAKE ONE (1) CAPSULE EACH DAY 30 tablet 1  . levothyroxine (SYNTHROID, LEVOTHROID) 50 MCG tablet TAKE ONE TABLET EACH MORNING BEFORE BREAKFAST 30 tablet 9  . metFORMIN (GLUCOPHAGE) 1000 MG tablet TAKE ONE TABLET TWICE A DAY WITH FOOD 60 tablet 1  . metoprolol succinate (TOPROL-XL) 100 MG 24 hr tablet TAKE ONE (1) TABLET EACH DAY 30 tablet 3  . NAMENDA XR 28 MG CP24 24 hr capsule TAKE ONE (1) CAPSULE EACH DAY 30 capsule 3  . NIFEdipine (PROCARDIA XL/ADALAT-CC) 90 MG 24 hr tablet TAKE ONE (1) TABLET EACH DAY 30 tablet 3  . nitroGLYCERIN (NITRODUR - DOSED IN MG/24 HR) 0.1 mg/hr patch Place 1 patch (0.1 mg total) onto the skin daily as needed. 30 patch 11  . OLANZapine (ZYPREXA) 5 MG tablet TAKE ONE TABLET DAILY AT BEDTIME 30 tablet 3  . pantoprazole (PROTONIX) 40 MG tablet TAKE ONE TABLET BY MOUTH TWICE DAILY 60 tablet 11  . potassium chloride (K-DUR) 10 MEQ tablet TAKE ONE TABLET BY MOUTH TWICE DAILY 60 tablet 3  . rosuvastatin (CRESTOR) 20 MG tablet TAKE ONE (1) TABLET EACH DAY 30 tablet  3  . valsartan (DIOVAN) 320 MG tablet TAKE ONE (1) TABLET EACH DAY 30 tablet 3  . VENTOLIN HFA 108 (90 Base) MCG/ACT inhaler USE 2 PUFFS EVERY 6 HOURS AS NEEDED 18 g 6  . Vitamin D, Ergocalciferol, (DRISDOL) 50000 units CAPS capsule Take 1 capsule (50,000 Units total) by mouth 2 (two) times a week. 24 capsule 2  . zolpidem (AMBIEN) 10 MG tablet Take 1 tablet (10 mg total) by mouth at bedtime as needed. 30 tablet 5   No current facility-administered medications for this visit.     Physical Exam BP (!) 190/92 (BP Location: Right Arm, Patient Position: Sitting, Cuff Size: Large)   Pulse (!) 55   Resp 16   Ht 5\' 4"  (1.626 m)   Wt 236 lb (107 kg)   SpO2 92% Comment: RA  BMI 40.51 kg/m   Diagnostic Tests: CT ANGIOGRAPHY CHEST WITH CONTRAST  TECHNIQUE: Multidetector CT imaging of the chest  was performed using the standard protocol during bolus administration of intravenous contrast. Multiplanar CT image reconstructions and MIPs were obtained to evaluate the vascular anatomy.  CONTRAST:  75 mL Isovue 370.  COMPARISON:  10/07/2015  FINDINGS: Cardiovascular: Thoracic aorta again demonstrates dilatation of the ascending portion to 4.7 cm. This has increased slightly in the interval from the prior exam at which time it measured 4.4 cm. The diameter at the level of sinus of Valsalva is 3.2 cm stable from the prior exam. Normal tapering in the aortic arch is noted. The proximal arch measures 3.3 cm slightly increased when compared with the prior exam. The descending aorta shows a normal caliber (2.5 cm). Pulmonary artery as visualized is within normal limits. Mild coronary calcifications are seen. No pericardial effusion is noted.  Mediastinum/Nodes: The thoracic inlet is within normal limits. No significant hilar or mediastinal adenopathy is identified. The scattered small lymph nodes seen previously are again identified and stable. The esophagus as visualize is within normal limits.  Lungs/Pleura: The lungs are well aerated bilaterally without focal infiltrate or sizable effusion. Tiny nodule is again seen in the posterior aspect of the left upper lobe on image number 52 of series 5. This is stable from the prior exam as well as a prior exam from 2013 consistent with a benign etiology.  Upper Abdomen: Gallbladder has been surgically removed. No other focal abnormality is noted.  Musculoskeletal: No acute bony abnormality is noted.  Review of the MIP images confirms the above findings.  IMPRESSION: Slight increase in size of the ascending thoracic aortic aneurysm to 4.7 cm from 4.4 cm. Ascending thoracic aortic aneurysm. Recommend semi-annual imaging followup by CTA or MRA and referral to cardiothoracic surgery if not already obtained. This  recommendation follows 2010 ACCF/AHA/AATS/ACR/ASA/SCA/SCAI/SIR/STS/SVM Guidelines for the Diagnosis and Management of Patients With Thoracic Aortic Disease. Circulation. 2010; 121: K800-L491  Stable left upper lobe nodule dating back to 2013 consistent with benign etiology.   Electronically Signed   By: Inez Catalina M.D.   On: 10/05/2016 12:32  I personally reviewed the CT images and disagree with the radiologist's interpretation. I do not think there is been any significant change at all between the films from a year ago and the current films. The 4.7 cm measurement comes on an area of curvature of the aorta and quite obviously is not a true diameter but rather an elliptical diameter. The true diameter perpendicular to flow appears to be 4.4 cm and unchanged from previous scan.  Impression: 66 year old woman with a 4.4 cm  ascending aneurysm that his been unchanged over the past 6 years. She needs continued annual follow-up. I will plan to see her back in a year with a CT angiogram.  Tiny lung nodule in the left upper lobe. This is about 3 mm and is unchanged over time. Its likely an old calcified granuloma.  Hypertension- Her blood pressure was markedly elevated today. She is asymptomatic. She claims to have taken her blood pressure medication on a regular basis including today. She has a blood pressure cuff at home but does not follow her blood pressure. I recommended strongly that she check her blood pressure on a regular basis and if she notices systolic pressure greater than 140 she needs to notify either her primary physician or cardiologist so that medication adjustments can be made.  Morbid obesity - I again encouraged her to begin exercising and it stressed the importance of that. Given her obesity, diabetes, hypertension and hyperlipidemia, she is going to get into some serious trouble in the near future if she doesn't start taking better care of herself.   Plan:  Return in  one year with CT angiogram of the chest  Melrose Nakayama, MD [facility] [phone]

## 2016-10-11 ENCOUNTER — Encounter: Payer: Self-pay | Admitting: Physician Assistant

## 2016-10-11 ENCOUNTER — Encounter (HOSPITAL_COMMUNITY): Payer: Self-pay

## 2016-10-11 ENCOUNTER — Ambulatory Visit (INDEPENDENT_AMBULATORY_CARE_PROVIDER_SITE_OTHER): Payer: Medicare HMO | Admitting: Physician Assistant

## 2016-10-11 VITALS — BP 163/83 | HR 59 | Temp 97.6°F | Ht 64.0 in | Wt 236.0 lb

## 2016-10-11 DIAGNOSIS — I10 Essential (primary) hypertension: Secondary | ICD-10-CM

## 2016-10-11 DIAGNOSIS — F419 Anxiety disorder, unspecified: Secondary | ICD-10-CM

## 2016-10-11 DIAGNOSIS — R635 Abnormal weight gain: Secondary | ICD-10-CM | POA: Insufficient documentation

## 2016-10-11 DIAGNOSIS — M5136 Other intervertebral disc degeneration, lumbar region: Secondary | ICD-10-CM | POA: Diagnosis not present

## 2016-10-11 MED ORDER — HYDROCODONE-ACETAMINOPHEN 10-325 MG PO TABS: 1.0000 | ORAL_TABLET | Freq: Three times a day (TID) | ORAL | 0 refills | Status: DC | PRN

## 2016-10-11 MED ORDER — AMLODIPINE BESYLATE 5 MG PO TABS: 5.0000 mg | ORAL_TABLET | Freq: Every day | ORAL | 2 refills | Status: DC

## 2016-10-11 MED ORDER — ALPRAZOLAM 0.5 MG PO TABS: 0.5000 mg | ORAL_TABLET | Freq: Three times a day (TID) | ORAL | 2 refills | Status: DC | PRN

## 2016-10-11 NOTE — Progress Notes (Signed)
BP (!) 163/83   Pulse (!) 59   Temp 97.6 F (36.4 C) (Oral)   Ht 5\' 4"  (1.626 m)   Wt 236 lb (107 kg)   BMI 40.51 kg/m    Subjective:    Patient ID: Kara Nunez, female    DOB: Jan 14, 1951, 66 y.o.   MRN: [id number]  HPI: Kara Nunez is a 66 y.o. female presenting on 10/11/2016 for Hypertension [place] to see Dr. [name] for aneurysm and BP was elevated )  This patient comes in for periodic recheck on medications and conditions. All medications are reviewed today. There are no reports of any problems with the medications. All of the medical conditions are reviewed and updated.  Lab work is reviewed and will be ordered as medically necessary.She is here for recheck on her blood pressure because it was quite elevated while she was at the cardiovascular surgeons office. There is not been a huge change in her abdominal aneurysm and they will not be doing surgery this year. We need to tightly control her cholesterol and blood pressure. We will make a change in blood pressure medicine today and plan to recheck her in the next couple months. She can come back for a triage visit in one month.  Relevant past medical, surgical, family and social history reviewed and updated as indicated. Allergies and medications reviewed and updated.  Past Medical History:  Diagnosis Date  . Allergic rhinitis   . Ascending aortic aneurysm ([place])    Followed by Dr. [name]  . Chronic back pain   . Coronary atherosclerosis of native coronary artery    Prior evaluation with in [facility] - details not clear   . Degenerative disc disease   . Essential hypertension, benign   . Hyperlipidemia   . Insomnia   . Type 2 diabetes mellitus ([place])     Past Surgical History:  Procedure Laterality Date  . CHOLECYSTECTOMY    . TUBAL LIGATION      Review of Systems  Constitutional: Negative.  Negative for activity change, fatigue and fever.  HENT: Negative.   Eyes: Negative.   Respiratory: Negative.   Negative for cough.   Cardiovascular: Negative.  Negative for chest pain.  Gastrointestinal: Negative.  Negative for abdominal pain.  Endocrine: Negative.   Genitourinary: Negative.  Negative for dysuria.  Musculoskeletal: Negative.   Skin: Negative.   Neurological: Negative.     Allergies as of 10/11/2016      Reactions   Codeine Nausea Only   Amoxicillin Rash      Medication List       Accurate as of 10/11/16  5:19 PM. Always use your most recent med list.          ALPRAZolam 0.5 MG tablet Commonly known as:  XANAX Take 1 tablet (0.5 mg total) by mouth 3 (three) times daily as needed for anxiety.   amLODipine 5 MG tablet Commonly known as:  NORVASC Take 1 tablet (5 mg total) by mouth daily.   aspirin EC 81 MG tablet Take 81 mg by mouth daily.   donepezil 10 MG tablet Commonly known as:  ARICEPT TAKE ONE TABLET DAILY AT BEDTIME   DULoxetine 60 MG capsule Commonly known as:  CYMBALTA TAKE ONE (1) CAPSULE EACH DAY   fenofibrate micronized 134 MG capsule Commonly known as:  LOFIBRA TAKE 1 CAPSULE EVERY MORNING BEFORE BREAKFAST   fluorouracil 5 % cream Commonly known as:  EFUDEX Apply 1 application topically 2 (two) times daily as needed (  Use on face).   furosemide 20 MG tablet Commonly known as:  LASIX Take 1 tablet (20 mg total) by mouth daily.   HYDROcodone-acetaminophen 10-325 MG tablet Commonly known as:  NORCO Take 1 tablet by mouth every 8 (eight) hours as needed.   HYDROcodone-acetaminophen 10-325 MG tablet Commonly known as:  NORCO Take 1 tablet by mouth every 8 (eight) hours as needed.   IFEREX 150 150 MG capsule Generic drug:  iron polysaccharides TAKE ONE (1) CAPSULE EACH DAY   JANUVIA 100 MG tablet Generic drug:  sitaGLIPtin TAKE ONE (1) CAPSULE EACH DAY   levothyroxine 50 MCG tablet Commonly known as:  SYNTHROID, LEVOTHROID TAKE ONE TABLET EACH MORNING BEFORE BREAKFAST   metFORMIN 1000 MG tablet Commonly known as:  GLUCOPHAGE TAKE  ONE TABLET TWICE A DAY WITH FOOD   metoprolol succinate 100 MG 24 hr tablet Commonly known as:  TOPROL-XL TAKE ONE (1) TABLET EACH DAY   NAMENDA XR 28 MG Cp24 24 hr capsule Generic drug:  memantine TAKE ONE (1) CAPSULE EACH DAY   NIFEdipine 90 MG 24 hr tablet Commonly known as:  PROCARDIA XL/ADALAT-CC TAKE ONE (1) TABLET EACH DAY   nitroGLYCERIN 0.1 mg/hr patch Commonly known as:  NITRODUR - Dosed in mg/24 hr Place 1 patch (0.1 mg total) onto the skin daily as needed.   OLANZapine 5 MG tablet Commonly known as:  ZYPREXA TAKE ONE TABLET DAILY AT BEDTIME   pantoprazole 40 MG tablet Commonly known as:  PROTONIX TAKE ONE TABLET BY MOUTH TWICE DAILY   potassium chloride 10 MEQ tablet Commonly known as:  K-DUR TAKE ONE TABLET BY MOUTH TWICE DAILY   rosuvastatin 20 MG tablet Commonly known as:  CRESTOR TAKE ONE (1) TABLET EACH DAY   valsartan 320 MG tablet Commonly known as:  DIOVAN TAKE ONE (1) TABLET EACH DAY   VENTOLIN HFA 108 (90 Base) MCG/ACT inhaler Generic drug:  albuterol USE 2 PUFFS EVERY 6 HOURS AS NEEDED   Vitamin D (Ergocalciferol) 50000 units Caps capsule Commonly known as:  DRISDOL Take 1 capsule (50,000 Units total) by mouth 2 (two) times a week.   zolpidem 10 MG tablet Commonly known as:  AMBIEN Take 1 tablet (10 mg total) by mouth at bedtime as needed.          Objective:    BP (!) 163/83   Pulse (!) 59   Temp 97.6 F (36.4 C) (Oral)   Ht 5\' 4"  (1.626 m)   Wt 236 lb (107 kg)   BMI 40.51 kg/m   Allergies  Allergen Reactions  . Codeine Nausea Only  . Amoxicillin Rash    Physical Exam  Constitutional: She is oriented to person, place, and time. She appears well-developed and well-nourished.  HENT:  Head: Normocephalic and atraumatic.  Eyes: Conjunctivae and EOM are normal. Pupils are equal, round, and reactive to light.  Cardiovascular: Normal rate, regular rhythm, normal heart sounds and intact distal pulses.   Pulmonary/Chest:  Effort normal and breath sounds normal.  Abdominal: Soft. Bowel sounds are normal.  Neurological: She is alert and oriented to person, place, and time. She has normal reflexes.  Skin: Skin is warm and dry. No rash noted.  Psychiatric: She has a normal mood and affect. Her behavior is normal. Judgment and thought content normal.        Assessment & Plan:   1. Essential hypertension - amLODipine (NORVASC) 5 MG tablet; Take 1 tablet (5 mg total) by mouth daily.  Dispense: 30 tablet;  Refill: 2  2. Weight gain 1500 calorie balanced diet given  3. DDD (degenerative disc disease), lumbar - HYDROcodone-acetaminophen (NORCO) 10-325 MG tablet; Take 1 tablet by mouth every 8 (eight) hours as needed.  Dispense: 90 tablet; Refill: 0 - HYDROcodone-acetaminophen (NORCO) 10-325 MG tablet; Take 1 tablet by mouth every 8 (eight) hours as needed.  Dispense: 90 tablet; Refill: 0  4. Anxiety - ALPRAZolam (XANAX) 0.5 MG tablet; Take 1 tablet (0.5 mg total) by mouth 3 (three) times daily as needed for anxiety.  Dispense: 90 tablet; Refill: 2   Continue all other maintenance medications as listed above.  Follow up plan: Return in about 3 months (around 01/09/2017) for AJ 3 month, 1 month triage nurse BP.  No orders of the defined types were placed in this encounter.   Educational handout given for DASH diet and 1500 calorie diet  Terald Sleeper PA-C [facility] [address]  [place], [place] [address] [phone]   10/11/2016, 5:19 PM

## 2016-10-11 NOTE — Patient Instructions (Signed)
DASH Eating Plan DASH stands for "Dietary Approaches to Stop Hypertension." The DASH eating plan is a healthy eating plan that has been shown to reduce high blood pressure (hypertension). Additional health benefits may include reducing the risk of type 2 diabetes mellitus, heart disease, and stroke. The DASH eating plan may also help with weight loss. What do I need to know about the DASH eating plan? For the DASH eating plan, you will follow these general guidelines:  Choose foods with less than 150 milligrams of sodium per serving (as listed on the food label).  Use salt-free seasonings or herbs instead of table salt or sea salt.  Check with your health care provider or pharmacist before using salt substitutes.  Eat lower-sodium products. These are often labeled as "low-sodium" or "no salt added."  Eat fresh foods. Avoid eating a lot of canned foods.  Eat more vegetables, fruits, and low-fat dairy products.  Choose whole grains. Look for the word "whole" as the first word in the ingredient list.  Choose fish and skinless chicken or turkey more often than red meat. Limit fish, poultry, and meat to 6 oz (170 g) each day.  Limit sweets, desserts, sugars, and sugary drinks.  Choose heart-healthy fats.  Eat more home-cooked food and less restaurant, buffet, and fast food.  Limit fried foods.  Do not fry foods. Cook foods using methods such as baking, boiling, grilling, and broiling instead.  When eating at a restaurant, ask that your food be prepared with less salt, or no salt if possible. What foods can I eat? Seek help from a dietitian for individual calorie needs. Grains  Whole grain or whole wheat bread. Brown rice. Whole grain or whole wheat pasta. Quinoa, bulgur, and whole grain cereals. Low-sodium cereals. Corn or whole wheat flour tortillas. Whole grain cornbread. Whole grain crackers. Low-sodium crackers. Vegetables  Fresh or frozen vegetables (raw, steamed, roasted, or  grilled). Low-sodium or reduced-sodium tomato and vegetable juices. Low-sodium or reduced-sodium tomato sauce and paste. Low-sodium or reduced-sodium canned vegetables. Fruits  All fresh, canned (in natural juice), or frozen fruits. Meat and Other Protein Products  Ground beef (85% or leaner), grass-fed beef, or beef trimmed of fat. Skinless chicken or turkey. Ground chicken or turkey. Pork trimmed of fat. All fish and seafood. Eggs. Dried beans, peas, or lentils. Unsalted nuts and seeds. Unsalted canned beans. Dairy  Low-fat dairy products, such as skim or 1% milk, 2% or reduced-fat cheeses, low-fat ricotta or cottage cheese, or plain low-fat yogurt. Low-sodium or reduced-sodium cheeses. Fats and Oils  Tub margarines without trans fats. Light or reduced-fat mayonnaise and salad dressings (reduced sodium). Avocado. Safflower, olive, or canola oils. Natural peanut or almond butter. Other  Unsalted popcorn and pretzels. The items listed above may not be a complete list of recommended foods or beverages. Contact your dietitian for more options.  What foods are not recommended? Grains  White bread. White pasta. White rice. Refined cornbread. Bagels and croissants. Crackers that contain trans fat. Vegetables  Creamed or fried vegetables. Vegetables in a cheese sauce. Regular canned vegetables. Regular canned tomato sauce and paste. Regular tomato and vegetable juices. Fruits  Canned fruit in light or heavy syrup. Fruit juice. Meat and Other Protein Products  Fatty cuts of meat. Ribs, chicken wings, bacon, sausage, bologna, salami, chitterlings, fatback, hot dogs, bratwurst, and packaged luncheon meats. Salted nuts and seeds. Canned beans with salt. Dairy  Whole or 2% milk, cream, half-and-half, and cream cheese. Whole-fat or sweetened yogurt. Full-fat cheeses   or blue cheese. Nondairy creamers and whipped toppings. Processed cheese, cheese spreads, or cheese curds. Condiments  Onion and garlic  salt, seasoned salt, table salt, and sea salt. Canned and packaged gravies. Worcestershire sauce. Tartar sauce. Barbecue sauce. Teriyaki sauce. Soy sauce, including reduced sodium. Steak sauce. Fish sauce. Oyster sauce. Cocktail sauce. Horseradish. Ketchup and mustard. Meat flavorings and tenderizers. Bouillon cubes. Hot sauce. Tabasco sauce. Marinades. Taco seasonings. Relishes. Fats and Oils  Butter, stick margarine, lard, shortening, ghee, and bacon fat. Coconut, palm kernel, or palm oils. Regular salad dressings. Other  Pickles and olives. Salted popcorn and pretzels. The items listed above may not be a complete list of foods and beverages to avoid. Contact your dietitian for more information.  Where can I find more information? National Heart, Lung, and Blood Institute: travelstabloid.com This information is not intended to replace advice given to you by your health care provider. Make sure you discuss any questions you have with your health care provider. Document Released: 08/19/2011 Document Revised: 02/05/2016 Document Reviewed: 07/04/2013 Elsevier Interactive Patient Education  2017 Elsevier Inc.       DIET:  Breakfast: eggs 2-3 Or greek yogurt low fat DANNON 1 slice delightful Lynnae Sandhoff bread  Lunch: 2 slice Lynnae Sandhoff delightful bread 4 ounces chicken, [place], roast beef 1 slice Thin sliced cheese [place] Mustard ok 1 piece of fruit  Supper: 6 ounces lean meat 2 cups raw/cooked veg or 1 cup pintos, corn lima  3 snacks 100 calories or less

## 2016-10-13 DIAGNOSIS — D1800 Hemangioma unspecified site: Secondary | ICD-10-CM | POA: Diagnosis not present

## 2016-10-13 DIAGNOSIS — Z1283 Encounter for screening for malignant neoplasm of skin: Secondary | ICD-10-CM | POA: Diagnosis not present

## 2016-10-13 DIAGNOSIS — D229 Melanocytic nevi, unspecified: Secondary | ICD-10-CM | POA: Diagnosis not present

## 2016-10-13 DIAGNOSIS — L219 Seborrheic dermatitis, unspecified: Secondary | ICD-10-CM | POA: Diagnosis not present

## 2016-10-13 DIAGNOSIS — E119 Type 2 diabetes mellitus without complications: Secondary | ICD-10-CM | POA: Diagnosis not present

## 2016-10-13 DIAGNOSIS — L821 Other seborrheic keratosis: Secondary | ICD-10-CM | POA: Diagnosis not present

## 2016-10-15 DIAGNOSIS — R809 Proteinuria, unspecified: Secondary | ICD-10-CM | POA: Diagnosis not present

## 2016-10-15 DIAGNOSIS — Z79899 Other long term (current) drug therapy: Secondary | ICD-10-CM | POA: Diagnosis not present

## 2016-10-15 DIAGNOSIS — N183 Chronic kidney disease, stage 3 (moderate): Secondary | ICD-10-CM | POA: Diagnosis not present

## 2016-10-15 DIAGNOSIS — I10 Essential (primary) hypertension: Secondary | ICD-10-CM | POA: Diagnosis not present

## 2016-10-15 DIAGNOSIS — E559 Vitamin D deficiency, unspecified: Secondary | ICD-10-CM | POA: Diagnosis not present

## 2016-10-15 DIAGNOSIS — D509 Iron deficiency anemia, unspecified: Secondary | ICD-10-CM | POA: Diagnosis not present

## 2016-10-20 DIAGNOSIS — E669 Obesity, unspecified: Secondary | ICD-10-CM | POA: Diagnosis not present

## 2016-10-20 DIAGNOSIS — R809 Proteinuria, unspecified: Secondary | ICD-10-CM | POA: Diagnosis not present

## 2016-10-20 DIAGNOSIS — N183 Chronic kidney disease, stage 3 (moderate): Secondary | ICD-10-CM | POA: Diagnosis not present

## 2016-10-20 DIAGNOSIS — I10 Essential (primary) hypertension: Secondary | ICD-10-CM | POA: Diagnosis not present

## 2016-10-22 DIAGNOSIS — G4733 Obstructive sleep apnea (adult) (pediatric): Secondary | ICD-10-CM | POA: Diagnosis not present

## 2016-10-25 ENCOUNTER — Ambulatory Visit: Payer: Commercial Managed Care - HMO | Admitting: Physician Assistant

## 2016-10-28 ENCOUNTER — Other Ambulatory Visit: Payer: Self-pay | Admitting: Physician Assistant

## 2016-11-04 ENCOUNTER — Encounter: Payer: Self-pay | Admitting: Physician Assistant

## 2016-11-04 ENCOUNTER — Ambulatory Visit (INDEPENDENT_AMBULATORY_CARE_PROVIDER_SITE_OTHER): Payer: Medicare HMO | Admitting: Physician Assistant

## 2016-11-04 VITALS — BP 176/88 | HR 61 | Temp 96.6°F | Ht 64.0 in | Wt 238.2 lb

## 2016-11-04 DIAGNOSIS — I10 Essential (primary) hypertension: Secondary | ICD-10-CM

## 2016-11-04 DIAGNOSIS — I25119 Atherosclerotic heart disease of native coronary artery with unspecified angina pectoris: Secondary | ICD-10-CM | POA: Diagnosis not present

## 2016-11-04 MED ORDER — AMLODIPINE BESYLATE 10 MG PO TABS: 10.0000 mg | ORAL_TABLET | Freq: Every day | ORAL | 1 refills | Status: DC

## 2016-11-04 MED ORDER — CLONIDINE HCL 0.1 MG PO TABS: 0.1000 mg | ORAL_TABLET | Freq: Two times a day (BID) | ORAL | 1 refills | Status: DC

## 2016-11-04 NOTE — Patient Instructions (Signed)
DASH Eating Plan DASH stands for "Dietary Approaches to Stop Hypertension." The DASH eating plan is a healthy eating plan that has been shown to reduce high blood pressure (hypertension). Additional health benefits may include reducing the risk of type 2 diabetes mellitus, heart disease, and stroke. The DASH eating plan may also help with weight loss. What do I need to know about the DASH eating plan? For the DASH eating plan, you will follow these general guidelines:  Choose foods with less than 150 milligrams of sodium per serving (as listed on the food label).  Use salt-free seasonings or herbs instead of table salt or sea salt.  Check with your health care provider or pharmacist before using salt substitutes.  Eat lower-sodium products. These are often labeled as "low-sodium" or "no salt added."  Eat fresh foods. Avoid eating a lot of canned foods.  Eat more vegetables, fruits, and low-fat dairy products.  Choose whole grains. Look for the word "whole" as the first word in the ingredient list.  Choose fish and skinless chicken or turkey more often than red meat. Limit fish, poultry, and meat to 6 oz (170 g) each day.  Limit sweets, desserts, sugars, and sugary drinks.  Choose heart-healthy fats.  Eat more home-cooked food and less restaurant, buffet, and fast food.  Limit fried foods.  Do not fry foods. Cook foods using methods such as baking, boiling, grilling, and broiling instead.  When eating at a restaurant, ask that your food be prepared with less salt, or no salt if possible. What foods can I eat? Seek help from a dietitian for individual calorie needs. Grains  Whole grain or whole wheat bread. Brown rice. Whole grain or whole wheat pasta. Quinoa, bulgur, and whole grain cereals. Low-sodium cereals. Corn or whole wheat flour tortillas. Whole grain cornbread. Whole grain crackers. Low-sodium crackers. Vegetables  Fresh or frozen vegetables (raw, steamed, roasted, or  grilled). Low-sodium or reduced-sodium tomato and vegetable juices. Low-sodium or reduced-sodium tomato sauce and paste. Low-sodium or reduced-sodium canned vegetables. Fruits  All fresh, canned (in natural juice), or frozen fruits. Meat and Other Protein Products  Ground beef (85% or leaner), grass-fed beef, or beef trimmed of fat. Skinless chicken or turkey. Ground chicken or turkey. Pork trimmed of fat. All fish and seafood. Eggs. Dried beans, peas, or lentils. Unsalted nuts and seeds. Unsalted canned beans. Dairy  Low-fat dairy products, such as skim or 1% milk, 2% or reduced-fat cheeses, low-fat ricotta or cottage cheese, or plain low-fat yogurt. Low-sodium or reduced-sodium cheeses. Fats and Oils  Tub margarines without trans fats. Light or reduced-fat mayonnaise and salad dressings (reduced sodium). Avocado. Safflower, olive, or canola oils. Natural peanut or almond butter. Other  Unsalted popcorn and pretzels. The items listed above may not be a complete list of recommended foods or beverages. Contact your dietitian for more options.  What foods are not recommended? Grains  White bread. White pasta. White rice. Refined cornbread. Bagels and croissants. Crackers that contain trans fat. Vegetables  Creamed or fried vegetables. Vegetables in a cheese sauce. Regular canned vegetables. Regular canned tomato sauce and paste. Regular tomato and vegetable juices. Fruits  Canned fruit in light or heavy syrup. Fruit juice. Meat and Other Protein Products  Fatty cuts of meat. Ribs, chicken wings, bacon, sausage, bologna, salami, chitterlings, fatback, hot dogs, bratwurst, and packaged luncheon meats. Salted nuts and seeds. Canned beans with salt. Dairy  Whole or 2% milk, cream, half-and-half, and cream cheese. Whole-fat or sweetened yogurt. Full-fat cheeses   or blue cheese. Nondairy creamers and whipped toppings. Processed cheese, cheese spreads, or cheese curds. Condiments  Onion and garlic  salt, seasoned salt, table salt, and sea salt. Canned and packaged gravies. Worcestershire sauce. Tartar sauce. Barbecue sauce. Teriyaki sauce. Soy sauce, including reduced sodium. Steak sauce. Fish sauce. Oyster sauce. Cocktail sauce. Horseradish. Ketchup and mustard. Meat flavorings and tenderizers. Bouillon cubes. Hot sauce. Tabasco sauce. Marinades. Taco seasonings. Relishes. Fats and Oils  Butter, stick margarine, lard, shortening, ghee, and bacon fat. Coconut, palm kernel, or palm oils. Regular salad dressings. Other  Pickles and olives. Salted popcorn and pretzels. The items listed above may not be a complete list of foods and beverages to avoid. Contact your dietitian for more information.  Where can I find more information? National Heart, Lung, and Blood Institute: www.nhlbi.nih.gov/health/health-topics/topics/dash/ This information is not intended to replace advice given to you by your health care provider. Make sure you discuss any questions you have with your health care provider. Document Released: 08/19/2011 Document Revised: 02/05/2016 Document Reviewed: 07/04/2013 Elsevier Interactive Patient Education  2017 Elsevier Inc.  

## 2016-11-05 ENCOUNTER — Encounter: Payer: Self-pay | Admitting: Cardiology

## 2016-11-07 NOTE — Progress Notes (Signed)
BP (!) 176/88   Pulse 61   Temp (!) 96.6 F (35.9 C) (Oral)   Ht 5\' 4"  (1.626 m)   Wt 238 lb 3.2 oz (108 kg)   BMI 40.89 kg/m    Subjective:    Patient ID: Kara Nunez, female    DOB: Apr 18, 1951, 66 y.o.   MRN: [id number]  HPI: Kara Nunez is a 66 y.o. female presenting on 11/04/2016 for Hypertension  This patient comes in for periodic recheck on medications and conditions including hypertension, CAD.   All medications are reviewed today. There are no reports of any problems with the medications. All of the medical conditions are reviewed and updated.  Lab work is reviewed and will be ordered as medically necessary. There are no new problems reported with today's visit.  Relevant past medical, surgical, family and social history reviewed and updated as indicated. Allergies and medications reviewed and updated.  Past Medical History:  Diagnosis Date  . Allergic rhinitis   . Ascending aortic aneurysm ([place])    Followed by Dr. [name]  . Chronic back pain   . Coronary atherosclerosis of native coronary artery    Prior evaluation with in [facility] - details not clear   . Degenerative disc disease   . Essential hypertension, benign   . Hyperlipidemia   . Insomnia   . Type 2 diabetes mellitus ([place])     Past Surgical History:  Procedure Laterality Date  . CHOLECYSTECTOMY    . TUBAL LIGATION      Review of Systems  Constitutional: Negative.  Negative for activity change, fatigue and fever.  HENT: Negative.   Eyes: Negative.   Respiratory: Negative.  Negative for cough.   Cardiovascular: Negative.  Negative for chest pain.  Gastrointestinal: Negative.  Negative for abdominal pain.  Endocrine: Negative.   Genitourinary: Negative.  Negative for dysuria.  Musculoskeletal: Negative.   Skin: Negative.   Neurological: Negative.     Allergies as of 11/04/2016      Reactions   Codeine Nausea Only   Amoxicillin Rash      Medication List       Accurate as of  11/04/16 11:59 PM. Always use your most recent med list.          ALPRAZolam 0.5 MG tablet Commonly known as:  XANAX Take 1 tablet (0.5 mg total) by mouth 3 (three) times daily as needed for anxiety.   amLODipine 10 MG tablet Commonly known as:  NORVASC Take 1 tablet (10 mg total) by mouth daily.   aspirin EC 81 MG tablet Take 81 mg by mouth daily.   cloNIDine 0.1 MG tablet Commonly known as:  CATAPRES Take 1 tablet (0.1 mg total) by mouth 2 (two) times daily.   donepezil 10 MG tablet Commonly known as:  ARICEPT TAKE ONE TABLET DAILY AT BEDTIME   DULoxetine 60 MG capsule Commonly known as:  CYMBALTA TAKE ONE (1) CAPSULE EACH DAY   fenofibrate micronized 134 MG capsule Commonly known as:  LOFIBRA TAKE 1 CAPSULE EVERY MORNING BEFORE BREAKFAST   fluorouracil 5 % cream Commonly known as:  EFUDEX Apply 1 application topically 2 (two) times daily as needed (Use on face).   furosemide 20 MG tablet Commonly known as:  LASIX Take 1 tablet (20 mg total) by mouth daily.   HYDROcodone-acetaminophen 10-325 MG tablet Commonly known as:  NORCO Take 1 tablet by mouth every 8 (eight) hours as needed.   HYDROcodone-acetaminophen 10-325 MG tablet Commonly known as:  NORCO  Take 1 tablet by mouth every 8 (eight) hours as needed.   IFEREX 150 150 MG capsule Generic drug:  iron polysaccharides TAKE ONE (1) CAPSULE EACH DAY   JANUVIA 100 MG tablet Generic drug:  sitaGLIPtin TAKE ONE (1) TABLET EACH DAY   levothyroxine 50 MCG tablet Commonly known as:  SYNTHROID, LEVOTHROID TAKE ONE TABLET EACH MORNING BEFORE BREAKFAST   metFORMIN 1000 MG tablet Commonly known as:  GLUCOPHAGE TAKE ONE TABLET TWICE A DAY WITH FOOD   metoprolol succinate 100 MG 24 hr tablet Commonly known as:  TOPROL-XL TAKE ONE (1) TABLET EACH DAY   NAMENDA XR 28 MG Cp24 24 hr capsule Generic drug:  memantine TAKE ONE (1) CAPSULE EACH DAY   nitroGLYCERIN 0.1 mg/hr patch Commonly known as:  NITRODUR -  Dosed in mg/24 hr Place 1 patch (0.1 mg total) onto the skin daily as needed.   OLANZapine 5 MG tablet Commonly known as:  ZYPREXA TAKE ONE TABLET DAILY AT BEDTIME   pantoprazole 40 MG tablet Commonly known as:  PROTONIX TAKE ONE TABLET BY MOUTH TWICE DAILY   potassium chloride 10 MEQ tablet Commonly known as:  K-DUR TAKE ONE TABLET BY MOUTH TWICE DAILY   rosuvastatin 20 MG tablet Commonly known as:  CRESTOR TAKE ONE (1) TABLET EACH DAY   valsartan 320 MG tablet Commonly known as:  DIOVAN TAKE ONE (1) TABLET EACH DAY   VENTOLIN HFA 108 (90 Base) MCG/ACT inhaler Generic drug:  albuterol USE 2 PUFFS EVERY 6 HOURS AS NEEDED   Vitamin D (Ergocalciferol) 50000 units Caps capsule Commonly known as:  DRISDOL Take 1 capsule (50,000 Units total) by mouth 2 (two) times a week.   zolpidem 10 MG tablet Commonly known as:  AMBIEN Take 1 tablet (10 mg total) by mouth at bedtime as needed.          Objective:    BP (!) 176/88   Pulse 61   Temp (!) 96.6 F (35.9 C) (Oral)   Ht 5\' 4"  (1.626 m)   Wt 238 lb 3.2 oz (108 kg)   BMI 40.89 kg/m   Allergies  Allergen Reactions  . Codeine Nausea Only  . Amoxicillin Rash    Physical Exam  Constitutional: She is oriented to person, place, and time. She appears well-developed and well-nourished.  HENT:  Head: Normocephalic and atraumatic.  Eyes: Conjunctivae and EOM are normal. Pupils are equal, round, and reactive to light.  Cardiovascular: Normal rate, regular rhythm, normal heart sounds and intact distal pulses.   Pulmonary/Chest: Effort normal and breath sounds normal.  Abdominal: Soft. Bowel sounds are normal.  Neurological: She is alert and oriented to person, place, and time. She has normal reflexes.  Skin: Skin is warm and dry. No rash noted.  Psychiatric: She has a normal mood and affect. Her behavior is normal. Judgment and thought content normal.  Nursing note and vitals reviewed.       Assessment & Plan:   1.  Essential hypertension - amLODipine (NORVASC) 10 MG tablet; Take 1 tablet (10 mg total) by mouth daily.  Dispense: 30 tablet; Refill: 1 - cloNIDine (CATAPRES) 0.1 MG tablet; Take 1 tablet (0.1 mg total) by mouth 2 (two) times daily.  Dispense: 60 tablet; Refill: 1  2. Atherosclerosis of native coronary artery of native heart with angina pectoris ([place])  Continue all other maintenance medications as listed above.  Follow up plan: Return in about 4 weeks (around 12/02/2016) for recheck BP.  Educational handout given for DASH  diet  Terald Sleeper PA-C [facility] [address]  [place], [place] [address] [phone]   11/07/2016, 9:32 PM

## 2016-11-25 ENCOUNTER — Other Ambulatory Visit: Payer: Self-pay | Admitting: Physician Assistant

## 2016-11-25 DIAGNOSIS — I10 Essential (primary) hypertension: Secondary | ICD-10-CM

## 2016-12-01 ENCOUNTER — Other Ambulatory Visit: Payer: Self-pay | Admitting: Physician Assistant

## 2016-12-01 DIAGNOSIS — I10 Essential (primary) hypertension: Secondary | ICD-10-CM

## 2016-12-02 ENCOUNTER — Ambulatory Visit: Payer: Medicare HMO | Admitting: Physician Assistant

## 2016-12-06 ENCOUNTER — Encounter: Payer: Self-pay | Admitting: Physician Assistant

## 2016-12-08 ENCOUNTER — Ambulatory Visit: Payer: Medicare HMO | Admitting: Physician Assistant

## 2016-12-21 DIAGNOSIS — G4733 Obstructive sleep apnea (adult) (pediatric): Secondary | ICD-10-CM | POA: Diagnosis not present

## 2016-12-23 ENCOUNTER — Ambulatory Visit: Payer: Medicare HMO | Admitting: Cardiology

## 2016-12-24 ENCOUNTER — Ambulatory Visit: Payer: Medicare HMO | Admitting: Physician Assistant

## 2016-12-24 ENCOUNTER — Other Ambulatory Visit: Payer: Self-pay | Admitting: Physician Assistant

## 2016-12-24 DIAGNOSIS — F419 Anxiety disorder, unspecified: Secondary | ICD-10-CM

## 2016-12-24 DIAGNOSIS — I10 Essential (primary) hypertension: Secondary | ICD-10-CM

## 2016-12-24 NOTE — Telephone Encounter (Signed)
I gave 90 days on all, you will have to do Ambien and xanax. Route to pool for call in

## 2016-12-27 ENCOUNTER — Other Ambulatory Visit: Payer: Self-pay | Admitting: Physician Assistant

## 2016-12-27 NOTE — Telephone Encounter (Signed)
Rx's called in . 

## 2016-12-27 NOTE — Progress Notes (Signed)
Cardiology Office Note  Date: 12/28/2016   ID: Kara Nunez, DOB Feb 24, 1951, MRN [id number]  PCP: Terald Sleeper, PA-C  Primary Cardiologist: Rozann Lesches, MD   Chief Complaint  Patient presents with  . Coronary Artery Disease    History of Present Illness: Kara Nunez is a 65 y.o. female last seen in April 2017. She presents for a follow-up visit. States that she has had more difficulty controlling blood pressure despite reported compliance with her medications. She is not consistent with any type of exercise plan, and has not been able to lose any significant amount of weight so far. She does not report any chest pain.  She continue to follow with Dr. [name], had a chest CTA done in January of this year with ascending thoracic aortic aneurysm now measuring 4.7 cm. She is asymptomatic.  I reviewed her medications which are outlined below. She is on multimodal antihypertensive therapy. We did discuss considering Aldactone instead of Lasix but would need to follow-up BMET first. She states that she had some type of "Strep infection" earlier in the year that affected her kidneys. Last creatinine was 1.1 in January.  I personally reviewed her ECG today which shows sinus bradycardia with left anterior fascicular block, increased voltage, nonspecific T-wave changes.  Past Medical History:  Diagnosis Date  . Allergic rhinitis   . Ascending aortic aneurysm ([place])    Followed by Dr. [name]  . Chronic back pain   . Coronary atherosclerosis of native coronary artery    Prior evaluation with in [facility] - details not clear   . Degenerative disc disease   . Essential hypertension, benign   . Hyperlipidemia   . Insomnia   . Type 2 diabetes mellitus ([place])     Past Surgical History:  Procedure Laterality Date  . CHOLECYSTECTOMY    . TUBAL LIGATION      Current Outpatient Prescriptions  Medication Sig Dispense Refill  . ALPRAZolam (XANAX) 0.5 MG tablet TAKE ONE  TABLET 3 TIMES A DAY AS NEEDED. 270 tablet 0  . amLODipine (NORVASC) 10 MG tablet TAKE ONE (1) TABLET EACH DAY 30 tablet 4  . amLODipine (NORVASC) 10 MG tablet TAKE ONE (1) TABLET EACH DAY 30 tablet 2  . aspirin EC 81 MG tablet Take 81 mg by mouth daily.    . cloNIDine (CATAPRES) 0.1 MG tablet TAKE ONE TABLET BY MOUTH TWICE DAILY 60 tablet 4  . cloNIDine (CATAPRES) 0.1 MG tablet TAKE ONE TABLET BY MOUTH TWICE DAILY 60 tablet 2  . donepezil (ARICEPT) 10 MG tablet TAKE ONE TABLET DAILY AT BEDTIME 90 tablet 1  . DULoxetine (CYMBALTA) 60 MG capsule TAKE ONE (1) CAPSULE EACH DAY 30 capsule 2  . fenofibrate micronized (LOFIBRA) 134 MG capsule TAKE 1 CAPSULE EVERY MORNING BEFORE BREAKFAST 30 capsule 3  . fluorouracil (EFUDEX) 5 % cream Apply 1 application topically 2 (two) times daily as needed (Use on face).    . furosemide (LASIX) 20 MG tablet Take 1 tablet (20 mg total) by mouth daily. 30 tablet 11  . HYDROcodone-acetaminophen (NORCO) 10-325 MG tablet Take 1 tablet by mouth every 8 (eight) hours as needed. 90 tablet 0  . HYDROcodone-acetaminophen (NORCO) 10-325 MG tablet Take 1 tablet by mouth every 8 (eight) hours as needed. 90 tablet 0  . IFEREX 150 150 MG capsule TAKE ONE (1) CAPSULE EACH DAY 90 capsule 0  . JANUVIA 100 MG tablet TAKE ONE (1) TABLET EACH DAY 90 tablet 0  . levothyroxine (  SYNTHROID, LEVOTHROID) 50 MCG tablet TAKE ONE TABLET EACH MORNING BEFORE BREAKFAST 30 tablet 9  . metFORMIN (GLUCOPHAGE) 1000 MG tablet TAKE ONE TABLET TWICE A DAY WITH FOOD 60 tablet 2  . metoprolol succinate (TOPROL-XL) 100 MG 24 hr tablet TAKE ONE (1) TABLET EACH DAY 90 tablet 0  . NAMENDA XR 28 MG CP24 24 hr capsule TAKE ONE (1) CAPSULE EACH DAY 90 capsule 1  . NIFEdipine (PROCARDIA XL/ADALAT-CC) 90 MG 24 hr tablet TAKE ONE (1) TABLET EACH DAY 90 tablet 1  . nitroGLYCERIN (NITRODUR - DOSED IN MG/24 HR) 0.1 mg/hr patch Place 1 patch (0.1 mg total) onto the skin daily as needed. 30 patch 11  . OLANZapine  (ZYPREXA) 5 MG tablet TAKE ONE TABLET DAILY AT BEDTIME 90 tablet 1  . pantoprazole (PROTONIX) 40 MG tablet TAKE ONE TABLET BY MOUTH TWICE DAILY 60 tablet 11  . potassium chloride (K-DUR) 10 MEQ tablet TAKE ONE TABLET BY MOUTH TWICE DAILY 180 tablet 1  . rosuvastatin (CRESTOR) 20 MG tablet TAKE ONE (1) TABLET EACH DAY 90 tablet 1  . valsartan (DIOVAN) 320 MG tablet TAKE ONE (1) TABLET EACH DAY 90 tablet 1  . VENTOLIN HFA 108 (90 Base) MCG/ACT inhaler USE 2 PUFFS EVERY 6 HOURS AS NEEDED 54 g 3  . Vitamin D, Ergocalciferol, (DRISDOL) 50000 units CAPS capsule Take 1 capsule (50,000 Units total) by mouth 2 (two) times a week. 24 capsule 2  . zolpidem (AMBIEN) 10 MG tablet TAKE ONE TABLET AT BEDTIME AS NEEDED 90 tablet 0   No current facility-administered medications for this visit.    Allergies:  Codeine and Amoxicillin   Social History: The patient  reports that she has never smoked. She has never used smokeless tobacco. She reports that she drinks alcohol. She reports that she does not use drugs.   ROS:  Please see the history of present illness. Otherwise, complete review of systems is positive for chronic fatigue.  All other systems are reviewed and negative.   Physical Exam: VS:  BP (!) 166/92   Pulse 67   Ht 5\' 4"  (1.626 m)   Wt 242 lb (109.8 kg)   SpO2 92%   BMI 41.54 kg/m , BMI Body mass index is 41.54 kg/m.  Wt Readings from Last 3 Encounters:  12/28/16 242 lb (109.8 kg)  11/04/16 238 lb 3.2 oz (108 kg)  10/11/16 236 lb (107 kg)    General: Obese woman, appears comfortable at rest. HEENT: Conjunctiva and lids normal, oropharynx clear. Neck: Supple, no elevated JVP or carotid bruits, no thyromegaly. Lungs: Clear to auscultation, nonlabored breathing at rest. Cardiac: Regular rate and rhythm, no S3, 2/6 systolic murmur, no pericardial rub. Abdomen: Soft, nontender, bowel sounds present, no guarding or rebound. Extremities: No pitting edema, distal pulses 2+. Skin: Warm and  dry. Musculoskeletal: No kyphosis. Neuropsychiatric: Alert and oriented x3, affect grossly appropriate.  ECG: I personally reviewed the tracing from 12/24/2015 which showed sinus rhythm with leftward axis and increased voltage.  Recent Labwork: 06/28/2016: ALT 57; AST 48 09/20/2016: BUN 21; Creatinine, Ser 1.18; Hemoglobin 13.0; Platelets 287; Potassium 3.9; Sodium 142     Component Value Date/Time   CHOL 133 06/28/2016 0928   TRIG 183 (H) 06/28/2016 0928   HDL 52 06/28/2016 0928   CHOLHDL 2.6 06/28/2016 0928   LDLCALC 44 06/28/2016 0928    Other Studies Reviewed Today:  [facility] Cardiolite 10/31/2013: FINDINGS: Pharmacological stress  Baseline EKG showed normal sinus rhythm with left anterior fascicular block.  After injection, heart rate increased from 62 beats per min up to 82 beats per min and blood pressure decreased from 164/62 down to 115/57. The test was terminated after injection was complete. The patient did not experience any chest pain. Post-injection EKG showed no ischemic changes.  Myocardial perfusion imaging  Raw images showed appropriate radiotracer uptake. There is a small partially reversible defect in the distal anteroseptal wall, this area is mildly hypokinetic. There is a mild intensity moderate-sized defect in the lateral wall that is reversible, this area has normal wall motion. Raw images suggest that prominent breast and soft tissue over this lateral area are likely causing an artifact.  Gated imaging shows a end-diastolic volume of 842 mL, end systolic volume 39 mL, left ventricular ejection fraction 62%, t.i.d. 1.10.  IMPRESSION: 1. Abnormal Lexiscan MPI, overall low risk study.  2. Small anteroseptal defect with mild reversibility.  3. Normal left ventricular systolic function.  Chest CTA 10/05/2016: IMPRESSION: Slight increase in size of the ascending thoracic aortic aneurysm to 4.7 cm from 4.4 cm. Ascending thoracic aortic  aneurysm. Recommend semi-annual imaging followup by CTA or MRA and referral to cardiothoracic surgery if not already obtained. This recommendation follows 2010 ACCF/AHA/AATS/ACR/ASA/SCA/SCAI/SIR/STS/SVM Guidelines for the Diagnosis and Management of Patients With Thoracic Aortic Disease. Circulation. 2010; 121: J031-Y811  Stable left upper lobe nodule dating back to 2013 consistent with benign etiology.  Assessment and Plan:  1. History of CAD with previous workup in [place], details not clear. She underwent low risk ischemic testing within the last 3 years and is not reporting angina at this time. I reviewed her ECG. Plan is to continue medical therapy and observation for now.  2. Ascending aortic aneurysm followed by Dr. [name], asymptomatic and 4.7 cm by chest CTA earlier this year.  3. Uncontrolled hypertension. She is on multimodal antihypertensive therapy. May consider switching from Lasix to Aldactone, need to check BMET first. Deep follow-up with PCP.  4. Morbid obesity, weight loss discussed.  5. Hyperlipidemia, continue his on Crestor, last LDL 44.  Current medicines were reviewed with the patient today.   Orders Placed This Encounter  Procedures  . Basic Metabolic Panel (BMET)  . EKG 12-Lead    Disposition: Follow-up in one year.  Signed, Satira Sark, MD, [facility] 12/28/2016 9:23 AM    [facility]. [address], [place], [place] [address] Phone: [phone]; Fax: [phone]

## 2016-12-28 ENCOUNTER — Encounter: Payer: Self-pay | Admitting: Cardiology

## 2016-12-28 ENCOUNTER — Other Ambulatory Visit (HOSPITAL_COMMUNITY)
Admission: RE | Admit: 2016-12-28 | Discharge: 2016-12-28 | Disposition: A | Discharge status: Home / Self Care | Payer: Medicare HMO | Source: Ambulatory Visit | Attending: Cardiology | Admitting: Cardiology

## 2016-12-28 ENCOUNTER — Ambulatory Visit (INDEPENDENT_AMBULATORY_CARE_PROVIDER_SITE_OTHER): Payer: Medicare HMO | Admitting: Cardiology

## 2016-12-28 VITALS — BP 166/92 | HR 67 | Ht 64.0 in | Wt 242.0 lb

## 2016-12-28 DIAGNOSIS — Z79899 Other long term (current) drug therapy: Secondary | ICD-10-CM

## 2016-12-28 DIAGNOSIS — I712 Thoracic aortic aneurysm, without rupture: Secondary | ICD-10-CM

## 2016-12-28 DIAGNOSIS — I10 Essential (primary) hypertension: Secondary | ICD-10-CM | POA: Diagnosis not present

## 2016-12-28 DIAGNOSIS — E782 Mixed hyperlipidemia: Secondary | ICD-10-CM

## 2016-12-28 DIAGNOSIS — I7121 Aneurysm of the ascending aorta, without rupture: Secondary | ICD-10-CM

## 2016-12-28 DIAGNOSIS — I2510 Atherosclerotic heart disease of native coronary artery without angina pectoris: Secondary | ICD-10-CM | POA: Diagnosis not present

## 2016-12-28 LAB — BASIC METABOLIC PANEL
Anion gap: 8 (ref 5–15)
BUN: 19 mg/dL (ref 6–20)
CHLORIDE: 104 mmol/L (ref 101–111)
CO2: 25 mmol/L (ref 22–32)
Calcium: 9.6 mg/dL (ref 8.9–10.3)
Creatinine, Ser: 1.22 mg/dL — ABNORMAL HIGH (ref 0.44–1.00)
GFR calc Af Amer: 52 mL/min — ABNORMAL LOW (ref >60)
GFR calc non Af Amer: 45 mL/min — ABNORMAL LOW (ref >60)
GLUCOSE: 191 mg/dL — AB (ref 65–99)
POTASSIUM: 4.1 mmol/L (ref 3.5–5.1)
Sodium: 137 mmol/L (ref 135–145)

## 2016-12-28 NOTE — Patient Instructions (Signed)
Medication Instructions:  Your physician recommends that you continue on your current medications as directed. Please refer to the Current Medication list given to you today.   Labwork: Today bmet  Testing/Procedures: none  Follow-Up: Your physician wants you to follow-up in: 1 year.  You will receive a reminder letter in the mail two months in advance. If you don't receive a letter, please call our office to schedule the follow-up appointment.   Any Other Special Instructions Will Be Listed Below (If Applicable).     If you need a refill on your cardiac medications before your next appointment, please call your pharmacy.

## 2017-01-04 ENCOUNTER — Ambulatory Visit (INDEPENDENT_AMBULATORY_CARE_PROVIDER_SITE_OTHER): Payer: Medicare HMO | Admitting: Physician Assistant

## 2017-01-04 ENCOUNTER — Encounter: Payer: Self-pay | Admitting: Physician Assistant

## 2017-01-04 DIAGNOSIS — I10 Essential (primary) hypertension: Secondary | ICD-10-CM

## 2017-01-04 MED ORDER — CLONIDINE HCL 0.1 MG PO TABS: 0.1000 mg | ORAL_TABLET | Freq: Three times a day (TID) | ORAL | 5 refills | Status: DC

## 2017-01-04 NOTE — Patient Instructions (Signed)
DASH Eating Plan DASH stands for "Dietary Approaches to Stop Hypertension." The DASH eating plan is a healthy eating plan that has been shown to reduce high blood pressure (hypertension). It may also reduce your risk for type 2 diabetes, heart disease, and stroke. The DASH eating plan may also help with weight loss. What are tips for following this plan? General guidelines  Avoid eating more than 2,300 mg (milligrams) of salt (sodium) a day. If you have hypertension, you may need to reduce your sodium intake to 1,500 mg a day.  Limit alcohol intake to no more than 1 drink a day for nonpregnant women and 2 drinks a day for men. One drink equals 12 oz of beer, 5 oz of wine, or 1 oz of hard liquor.  Work with your health care provider to maintain a healthy body weight or to lose weight. Ask what an ideal weight is for you.  Get at least 30 minutes of exercise that causes your heart to beat faster (aerobic exercise) most days of the week. Activities may include walking, swimming, or biking.  Work with your health care provider or diet and nutrition specialist (dietitian) to adjust your eating plan to your individual calorie needs. Reading food labels  Check food labels for the amount of sodium per serving. Choose foods with less than 5 percent of the Daily Value of sodium. Generally, foods with less than 300 mg of sodium per serving fit into this eating plan.  To find whole grains, look for the word "whole" as the first word in the ingredient list. Shopping  Buy products labeled as "low-sodium" or "no salt added."  Buy fresh foods. Avoid canned foods and premade or frozen meals. Cooking  Avoid adding salt when cooking. Use salt-free seasonings or herbs instead of table salt or sea salt. Check with your health care provider or pharmacist before using salt substitutes.  Do not fry foods. Cook foods using healthy methods such as baking, boiling, grilling, and broiling instead.  Cook with  heart-healthy oils, such as olive, canola, soybean, or sunflower oil. Meal planning   Eat a balanced diet that includes: ? 5 or more servings of fruits and vegetables each day. At each meal, try to fill half of your plate with fruits and vegetables. ? Up to 6-8 servings of whole grains each day. ? Less than 6 oz of lean meat, poultry, or fish each day. A 3-oz serving of meat is about the same size as a deck of cards. One egg equals 1 oz. ? 2 servings of low-fat dairy each day. ? A serving of nuts, seeds, or beans 5 times each week. ? Heart-healthy fats. Healthy fats called Omega-3 fatty acids are found in foods such as flaxseeds and coldwater fish, like sardines, salmon, and mackerel.  Limit how much you eat of the following: ? Canned or prepackaged foods. ? Food that is high in trans fat, such as fried foods. ? Food that is high in saturated fat, such as fatty meat. ? Sweets, desserts, sugary drinks, and other foods with added sugar. ? Full-fat dairy products.  Do not salt foods before eating.  Try to eat at least 2 vegetarian meals each week.  Eat more home-cooked food and less restaurant, buffet, and fast food.  When eating at a restaurant, ask that your food be prepared with less salt or no salt, if possible. What foods are recommended? The items listed may not be a complete list. Talk with your dietitian about what   dietary choices are best for you. Grains Whole-grain or whole-wheat bread. Whole-grain or whole-wheat pasta. Brown rice. Oatmeal. Quinoa. Bulgur. Whole-grain and low-sodium cereals. Pita bread. Low-fat, low-sodium crackers. Whole-wheat flour tortillas. Vegetables Fresh or frozen vegetables (raw, steamed, roasted, or grilled). Low-sodium or reduced-sodium tomato and vegetable juice. Low-sodium or reduced-sodium tomato sauce and tomato paste. Low-sodium or reduced-sodium canned vegetables. Fruits All fresh, dried, or frozen fruit. Canned fruit in natural juice (without  added sugar). Meat and other protein foods Skinless chicken or turkey. Ground chicken or turkey. Pork with fat trimmed off. Fish and seafood. Egg whites. Dried beans, peas, or lentils. Unsalted nuts, nut butters, and seeds. Unsalted canned beans. Lean cuts of beef with fat trimmed off. Low-sodium, lean deli meat. Dairy Low-fat (1%) or fat-free (skim) milk. Fat-free, low-fat, or reduced-fat cheeses. Nonfat, low-sodium ricotta or cottage cheese. Low-fat or nonfat yogurt. Low-fat, low-sodium cheese. Fats and oils Soft margarine without trans fats. Vegetable oil. Low-fat, reduced-fat, or light mayonnaise and salad dressings (reduced-sodium). Canola, safflower, olive, soybean, and sunflower oils. Avocado. Seasoning and other foods Herbs. Spices. Seasoning mixes without salt. Unsalted popcorn and pretzels. Fat-free sweets. What foods are not recommended? The items listed may not be a complete list. Talk with your dietitian about what dietary choices are best for you. Grains Baked goods made with fat, such as croissants, muffins, or some breads. Dry pasta or rice meal packs. Vegetables Creamed or fried vegetables. Vegetables in a cheese sauce. Regular canned vegetables (not low-sodium or reduced-sodium). Regular canned tomato sauce and paste (not low-sodium or reduced-sodium). Regular tomato and vegetable juice (not low-sodium or reduced-sodium). Pickles. Olives. Fruits Canned fruit in a light or heavy syrup. Fried fruit. Fruit in cream or butter sauce. Meat and other protein foods Fatty cuts of meat. Ribs. Fried meat. Bacon. Sausage. Bologna and other processed lunch meats. Salami. Fatback. Hotdogs. Bratwurst. Salted nuts and seeds. Canned beans with added salt. Canned or smoked fish. Whole eggs or egg yolks. Chicken or turkey with skin. Dairy Whole or 2% milk, cream, and half-and-half. Whole or full-fat cream cheese. Whole-fat or sweetened yogurt. Full-fat cheese. Nondairy creamers. Whipped toppings.  Processed cheese and cheese spreads. Fats and oils Butter. Stick margarine. Lard. Shortening. Ghee. Bacon fat. Tropical oils, such as coconut, palm kernel, or palm oil. Seasoning and other foods Salted popcorn and pretzels. Onion salt, garlic salt, seasoned salt, table salt, and sea salt. Worcestershire sauce. Tartar sauce. Barbecue sauce. Teriyaki sauce. Soy sauce, including reduced-sodium. Steak sauce. Canned and packaged gravies. Fish sauce. Oyster sauce. Cocktail sauce. Horseradish that you find on the shelf. Ketchup. Mustard. Meat flavorings and tenderizers. Bouillon cubes. Hot sauce and Tabasco sauce. Premade or packaged marinades. Premade or packaged taco seasonings. Relishes. Regular salad dressings. Where to find more information:  National Heart, Lung, and Blood Institute: www.nhlbi.nih.gov  American Heart Association: www.heart.org Summary  The DASH eating plan is a healthy eating plan that has been shown to reduce high blood pressure (hypertension). It may also reduce your risk for type 2 diabetes, heart disease, and stroke.  With the DASH eating plan, you should limit salt (sodium) intake to 2,300 mg a day. If you have hypertension, you may need to reduce your sodium intake to 1,500 mg a day.  When on the DASH eating plan, aim to eat more fresh fruits and vegetables, whole grains, lean proteins, low-fat dairy, and heart-healthy fats.  Work with your health care provider or diet and nutrition specialist (dietitian) to adjust your eating plan to your individual   calorie needs. This information is not intended to replace advice given to you by your health care provider. Make sure you discuss any questions you have with your health care provider. Document Released: 08/19/2011 Document Revised: 08/23/2016 Document Reviewed: 08/23/2016 Elsevier Interactive Patient Education  2017 [employer] Inc.  

## 2017-01-05 NOTE — Progress Notes (Signed)
BP 137/76   Pulse (!) 59   Temp 97.4 F (36.3 C) (Oral)   Ht 5\' 4"  (1.626 m)   Wt 238 lb 6.4 oz (108.1 kg)   BMI 40.92 kg/m    Subjective:    Patient ID: Kara Nunez, female    DOB: 04-17-1951, 66 y.o.   MRN: [id number]  HPI: Falicity Sheets is a 66 y.o. female presenting on 01/04/2017 for Hypertension  This patient comes in for periodic recheck on medications and conditions including hypertension. Her pressure log shows that she is consistently having a increase of her blood pressure in the evening hours. She is taking the clonidine twice a day. She sometimes misses the evening dose. I have reiterated how important it is take this medication there regularly. We have also discussed that I would like to her to go to 3 times a day on the medication. We have made a plan on how she can do this. Patient feels that she is able to do this. I've asked her to continue to keep her blood pressure log. We will recheck her in 4 weeks.   All medications are reviewed today. There are no reports of any problems with the medications. All of the medical conditions are reviewed and updated.  Lab work is reviewed and will be ordered as medically necessary. There are no new problems reported with today's visit.   Relevant past medical, surgical, family and social history reviewed and updated as indicated. Allergies and medications reviewed and updated.  Past Medical History:  Diagnosis Date  . Allergic rhinitis   . Ascending aortic aneurysm ([place])    Followed by Dr. [name]  . Chronic back pain   . Coronary atherosclerosis of native coronary artery    Prior evaluation with in [facility] - details not clear   . Degenerative disc disease   . Essential hypertension, benign   . Hyperlipidemia   . Insomnia   . Type 2 diabetes mellitus ([place])     Past Surgical History:  Procedure Laterality Date  . CHOLECYSTECTOMY    . TUBAL LIGATION      Review of Systems  Constitutional: Negative.   HENT:  Negative.   Eyes: Negative.   Respiratory: Negative.   Gastrointestinal: Negative.   Genitourinary: Negative.     Allergies as of 01/04/2017      Reactions   Codeine Nausea Only   Amoxicillin Rash      Medication List       Accurate as of 01/04/17 11:59 PM. Always use your most recent med list.          ALPRAZolam 0.5 MG tablet Commonly known as:  XANAX TAKE ONE TABLET 3 TIMES A DAY AS NEEDED.   amLODipine 10 MG tablet Commonly known as:  NORVASC TAKE ONE (1) TABLET EACH DAY   amLODipine 10 MG tablet Commonly known as:  NORVASC TAKE ONE (1) TABLET EACH DAY   aspirin EC 81 MG tablet Take 81 mg by mouth daily.   cloNIDine 0.1 MG tablet Commonly known as:  CATAPRES Take 1 tablet (0.1 mg total) by mouth 3 (three) times daily.   donepezil 10 MG tablet Commonly known as:  ARICEPT TAKE ONE TABLET DAILY AT BEDTIME   DULoxetine 60 MG capsule Commonly known as:  CYMBALTA TAKE ONE (1) CAPSULE EACH DAY   fenofibrate micronized 134 MG capsule Commonly known as:  LOFIBRA TAKE 1 CAPSULE EVERY MORNING BEFORE BREAKFAST   fluorouracil 5 % cream Commonly known as:  EFUDEX Apply 1 application topically 2 (two) times daily as needed (Use on face).   furosemide 20 MG tablet Commonly known as:  LASIX Take 1 tablet (20 mg total) by mouth daily.   HYDROcodone-acetaminophen 10-325 MG tablet Commonly known as:  NORCO Take 1 tablet by mouth every 8 (eight) hours as needed.   HYDROcodone-acetaminophen 10-325 MG tablet Commonly known as:  NORCO Take 1 tablet by mouth every 8 (eight) hours as needed.   IFEREX 150 150 MG capsule Generic drug:  iron polysaccharides TAKE ONE (1) CAPSULE EACH DAY   JANUVIA 100 MG tablet Generic drug:  sitaGLIPtin TAKE ONE (1) TABLET EACH DAY   levothyroxine 50 MCG tablet Commonly known as:  SYNTHROID, LEVOTHROID TAKE ONE TABLET EACH MORNING BEFORE BREAKFAST   metFORMIN 1000 MG tablet Commonly known as:  GLUCOPHAGE TAKE ONE TABLET TWICE A  DAY WITH FOOD   metoprolol succinate 100 MG 24 hr tablet Commonly known as:  TOPROL-XL TAKE ONE (1) TABLET EACH DAY   NAMENDA XR 28 MG Cp24 24 hr capsule Generic drug:  memantine TAKE ONE (1) CAPSULE EACH DAY   NIFEdipine 90 MG 24 hr tablet Commonly known as:  PROCARDIA XL/ADALAT-CC TAKE ONE (1) TABLET EACH DAY   nitroGLYCERIN 0.1 mg/hr patch Commonly known as:  NITRODUR - Dosed in mg/24 hr Place 1 patch (0.1 mg total) onto the skin daily as needed.   OLANZapine 5 MG tablet Commonly known as:  ZYPREXA TAKE ONE TABLET DAILY AT BEDTIME   pantoprazole 40 MG tablet Commonly known as:  PROTONIX TAKE ONE TABLET BY MOUTH TWICE DAILY   potassium chloride 10 MEQ tablet Commonly known as:  K-DUR TAKE ONE TABLET BY MOUTH TWICE DAILY   rosuvastatin 20 MG tablet Commonly known as:  CRESTOR TAKE ONE (1) TABLET EACH DAY   valsartan 320 MG tablet Commonly known as:  DIOVAN TAKE ONE (1) TABLET EACH DAY   VENTOLIN HFA 108 (90 Base) MCG/ACT inhaler Generic drug:  albuterol USE 2 PUFFS EVERY 6 HOURS AS NEEDED   Vitamin D (Ergocalciferol) 50000 units Caps capsule Commonly known as:  DRISDOL Take 1 capsule (50,000 Units total) by mouth 2 (two) times a week.   zolpidem 10 MG tablet Commonly known as:  AMBIEN TAKE ONE TABLET AT BEDTIME AS NEEDED          Objective:    BP 137/76   Pulse (!) 59   Temp 97.4 F (36.3 C) (Oral)   Ht 5\' 4"  (1.626 m)   Wt 238 lb 6.4 oz (108.1 kg)   BMI 40.92 kg/m   Allergies  Allergen Reactions  . Codeine Nausea Only  . Amoxicillin Rash    Physical Exam  Constitutional: She is oriented to person, place, and time. She appears well-developed and well-nourished.  HENT:  Head: Normocephalic and atraumatic.  Eyes: Conjunctivae and EOM are normal. Pupils are equal, round, and reactive to light.  Cardiovascular: Normal rate, regular rhythm, normal heart sounds and intact distal pulses.   Pulmonary/Chest: Effort normal and breath sounds normal.    Abdominal: Soft. Bowel sounds are normal.  Neurological: She is alert and oriented to person, place, and time. She has normal reflexes.  Skin: Skin is warm and dry. No rash noted.  Psychiatric: She has a normal mood and affect. Her behavior is normal. Judgment and thought content normal.  Nursing note and vitals reviewed.   Results for orders placed or performed during the hospital encounter of 26/83/41  Basic metabolic panel  Result  Value Ref Range   Sodium 137 135 - 145 mmol/L   Potassium 4.1 3.5 - 5.1 mmol/L   Chloride 104 101 - 111 mmol/L   CO2 25 22 - 32 mmol/L   Glucose, Bld 191 (H) 65 - 99 mg/dL   BUN 19 6 - 20 mg/dL   Creatinine, Ser 1.22 (H) 0.44 - 1.00 mg/dL   Calcium 9.6 8.9 - 10.3 mg/dL   GFR calc non Af Amer 45 (L) >60 mL/min   GFR calc Af Amer 52 (L) >60 mL/min   Anion gap 8 5 - 15      Assessment & Plan:   1. Essential hypertension - cloNIDine (CATAPRES) 0.1 MG tablet; Take 1 tablet (0.1 mg total) by mouth 3 (three) times daily.  Dispense: 90 tablet; Refill: 5   Current Outpatient Prescriptions:  .  ALPRAZolam (XANAX) 0.5 MG tablet, TAKE ONE TABLET 3 TIMES A DAY AS NEEDED., Disp: 270 tablet, Rfl: 0 .  amLODipine (NORVASC) 10 MG tablet, TAKE ONE (1) TABLET EACH DAY, Disp: 30 tablet, Rfl: 4 .  amLODipine (NORVASC) 10 MG tablet, TAKE ONE (1) TABLET EACH DAY, Disp: 30 tablet, Rfl: 2 .  aspirin EC 81 MG tablet, Take 81 mg by mouth daily., Disp: , Rfl:  .  cloNIDine (CATAPRES) 0.1 MG tablet, Take 1 tablet (0.1 mg total) by mouth 3 (three) times daily., Disp: 90 tablet, Rfl: 5 .  donepezil (ARICEPT) 10 MG tablet, TAKE ONE TABLET DAILY AT BEDTIME, Disp: 90 tablet, Rfl: 1 .  DULoxetine (CYMBALTA) 60 MG capsule, TAKE ONE (1) CAPSULE EACH DAY, Disp: 30 capsule, Rfl: 2 .  fenofibrate micronized (LOFIBRA) 134 MG capsule, TAKE 1 CAPSULE EVERY MORNING BEFORE BREAKFAST, Disp: 30 capsule, Rfl: 3 .  fluorouracil (EFUDEX) 5 % cream, Apply 1 application topically 2 (two) times  daily as needed (Use on face)., Disp: , Rfl:  .  furosemide (LASIX) 20 MG tablet, Take 1 tablet (20 mg total) by mouth daily., Disp: 30 tablet, Rfl: 11 .  HYDROcodone-acetaminophen (NORCO) 10-325 MG tablet, Take 1 tablet by mouth every 8 (eight) hours as needed., Disp: 90 tablet, Rfl: 0 .  HYDROcodone-acetaminophen (NORCO) 10-325 MG tablet, Take 1 tablet by mouth every 8 (eight) hours as needed., Disp: 90 tablet, Rfl: 0 .  IFEREX 150 150 MG capsule, TAKE ONE (1) CAPSULE EACH DAY, Disp: 90 capsule, Rfl: 0 .  JANUVIA 100 MG tablet, TAKE ONE (1) TABLET EACH DAY, Disp: 90 tablet, Rfl: 0 .  levothyroxine (SYNTHROID, LEVOTHROID) 50 MCG tablet, TAKE ONE TABLET EACH MORNING BEFORE BREAKFAST, Disp: 30 tablet, Rfl: 9 .  metFORMIN (GLUCOPHAGE) 1000 MG tablet, TAKE ONE TABLET TWICE A DAY WITH FOOD, Disp: 60 tablet, Rfl: 2 .  metoprolol succinate (TOPROL-XL) 100 MG 24 hr tablet, TAKE ONE (1) TABLET EACH DAY, Disp: 90 tablet, Rfl: 0 .  NAMENDA XR 28 MG CP24 24 hr capsule, TAKE ONE (1) CAPSULE EACH DAY, Disp: 90 capsule, Rfl: 1 .  NIFEdipine (PROCARDIA XL/ADALAT-CC) 90 MG 24 hr tablet, TAKE ONE (1) TABLET EACH DAY, Disp: 90 tablet, Rfl: 1 .  nitroGLYCERIN (NITRODUR - DOSED IN MG/24 HR) 0.1 mg/hr patch, Place 1 patch (0.1 mg total) onto the skin daily as needed., Disp: 30 patch, Rfl: 11 .  OLANZapine (ZYPREXA) 5 MG tablet, TAKE ONE TABLET DAILY AT BEDTIME, Disp: 90 tablet, Rfl: 1 .  pantoprazole (PROTONIX) 40 MG tablet, TAKE ONE TABLET BY MOUTH TWICE DAILY, Disp: 60 tablet, Rfl: 11 .  potassium chloride (K-DUR) 10 MEQ  tablet, TAKE ONE TABLET BY MOUTH TWICE DAILY, Disp: 180 tablet, Rfl: 1 .  rosuvastatin (CRESTOR) 20 MG tablet, TAKE ONE (1) TABLET EACH DAY, Disp: 90 tablet, Rfl: 1 .  valsartan (DIOVAN) 320 MG tablet, TAKE ONE (1) TABLET EACH DAY, Disp: 90 tablet, Rfl: 1 .  VENTOLIN HFA 108 (90 Base) MCG/ACT inhaler, USE 2 PUFFS EVERY 6 HOURS AS NEEDED, Disp: 54 g, Rfl: 3 .  Vitamin D, Ergocalciferol, (DRISDOL)  50000 units CAPS capsule, Take 1 capsule (50,000 Units total) by mouth 2 (two) times a week., Disp: 24 capsule, Rfl: 2 .  zolpidem (AMBIEN) 10 MG tablet, TAKE ONE TABLET AT BEDTIME AS NEEDED, Disp: 90 tablet, Rfl: 0  Continue all other maintenance medications as listed above.  Follow up plan: Return in about 4 weeks (around 02/01/2017) for blood pressure.  Educational handout given for [place] PA-C [facility] [address]  [place], [place] [address] [phone]   01/05/2017, 2:02 PM

## 2017-01-10 ENCOUNTER — Ambulatory Visit: Payer: Medicare HMO | Admitting: Physician Assistant

## 2017-01-24 ENCOUNTER — Other Ambulatory Visit: Payer: Self-pay | Admitting: Physician Assistant

## 2017-01-24 DIAGNOSIS — F419 Anxiety disorder, unspecified: Secondary | ICD-10-CM

## 2017-01-25 ENCOUNTER — Other Ambulatory Visit: Payer: Self-pay | Admitting: Physician Assistant

## 2017-01-25 NOTE — Telephone Encounter (Signed)
Zolpidem & Alprazolam refills called to [facility]

## 2017-01-28 ENCOUNTER — Encounter: Payer: Self-pay | Admitting: Physician Assistant

## 2017-01-28 ENCOUNTER — Ambulatory Visit (INDEPENDENT_AMBULATORY_CARE_PROVIDER_SITE_OTHER): Payer: Medicare HMO | Admitting: Physician Assistant

## 2017-01-28 ENCOUNTER — Other Ambulatory Visit: Payer: Self-pay | Admitting: Physician Assistant

## 2017-01-28 VITALS — BP 144/82 | HR 75 | Temp 97.1°F | Ht 64.0 in | Wt 238.2 lb

## 2017-01-28 DIAGNOSIS — I10 Essential (primary) hypertension: Secondary | ICD-10-CM | POA: Diagnosis not present

## 2017-01-28 MED ORDER — CLONIDINE HCL 0.1 MG PO TABS: 0.1000 mg | ORAL_TABLET | Freq: Three times a day (TID) | ORAL | 5 refills | Status: DC

## 2017-01-28 MED ORDER — CLONIDINE HCL 0.1 MG PO TABS: 0.1000 mg | ORAL_TABLET | Freq: Three times a day (TID) | ORAL | 1 refills | Status: DC

## 2017-01-28 MED ORDER — NIFEDIPINE ER 60 MG PO TB24: 120.0000 mg | ORAL_TABLET | Freq: Every day | ORAL | 1 refills | Status: DC

## 2017-01-28 MED ORDER — NIFEDIPINE ER 60 MG PO TB24: 120.0000 mg | ORAL_TABLET | Freq: Every day | ORAL | 6 refills | Status: DC

## 2017-01-28 NOTE — Progress Notes (Signed)
BP (!) 144/82 (BP Location: Right Wrist, Patient Position: Sitting)   Pulse 75   Temp 97.1 F (36.2 C) (Oral)   Ht 5\' 4"  (1.626 m)   Wt 238 lb 3.2 oz (108 kg)   BMI 40.89 kg/m    Subjective:    Patient ID: Kara Nunez, female    DOB: 1951-09-09, 66 y.o.   MRN: [id number]  HPI: Kara Nunez is a 66 y.o. female presenting on 01/28/2017 for Hypertension (consistently high has been checking it at home)  This patient comes in for periodic recheck on medications and conditions including hypertension. Patient states that she has had quite elevated blood pressures at home. She has brought her monitor him before and it seemed to read fairly close to our monitor. She states her blood pressure has been in the 180s over 100-120. She denies any chest pain. She is trying to watch her sodium. She states she is taking her medication on a very regular basis..   All medications are reviewed today. There are no reports of any problems with the medications. All of the medical conditions are reviewed and updated.  Lab work is reviewed and will be ordered as medically necessary. There are no new problems reported with today's visit.   Relevant past medical, surgical, family and social history reviewed and updated as indicated. Allergies and medications reviewed and updated.  Past Medical History:  Diagnosis Date  . Allergic rhinitis   . Ascending aortic aneurysm ([place])    Followed by Dr. [name]  . Chronic back pain   . Coronary atherosclerosis of native coronary artery    Prior evaluation with in [facility] - details not clear   . Degenerative disc disease   . Essential hypertension, benign   . Hyperlipidemia   . Insomnia   . Type 2 diabetes mellitus ([place])     Past Surgical History:  Procedure Laterality Date  . CHOLECYSTECTOMY    . TUBAL LIGATION      Review of Systems  Constitutional: Negative.  Negative for activity change, fatigue and fever.  HENT: Negative.   Eyes: Negative.     Respiratory: Negative.  Negative for cough.   Cardiovascular: Negative.  Negative for chest pain.  Gastrointestinal: Negative.  Negative for abdominal pain.  Endocrine: Negative.   Genitourinary: Negative.  Negative for dysuria.  Musculoskeletal: Negative.   Skin: Negative.   Neurological: Negative.     Allergies as of 01/28/2017      Reactions   Codeine Nausea Only   Amoxicillin Rash      Medication List       Accurate as of 01/28/17  3:05 PM. Always use your most recent med list.          ALPRAZolam 0.5 MG tablet Commonly known as:  XANAX TAKE ONE TABLET 3 TIMES A DAY AS NEEDED.   amLODipine 10 MG tablet Commonly known as:  NORVASC TAKE ONE (1) TABLET EACH DAY   aspirin EC 81 MG tablet Take 81 mg by mouth daily.   cloNIDine 0.1 MG tablet Commonly known as:  CATAPRES Take 1 tablet (0.1 mg total) by mouth 3 (three) times daily. Take 1 extra tablet per day if blood pressure 180/110 is seen on home monitor.   donepezil 10 MG tablet Commonly known as:  ARICEPT TAKE ONE TABLET DAILY AT BEDTIME   DULoxetine 60 MG capsule Commonly known as:  CYMBALTA TAKE ONE (1) CAPSULE EACH DAY   fenofibrate micronized 134 MG capsule Commonly known as:  LOFIBRA TAKE 1 CAPSULE EVERY MORNING BEFORE BREAKFAST   fluorouracil 5 % cream Commonly known as:  EFUDEX Apply 1 application topically 2 (two) times daily as needed (Use on face).   furosemide 20 MG tablet Commonly known as:  LASIX Take 1 tablet (20 mg total) by mouth daily.   HYDROcodone-acetaminophen 10-325 MG tablet Commonly known as:  NORCO Take 1 tablet by mouth every 8 (eight) hours as needed.   HYDROcodone-acetaminophen 10-325 MG tablet Commonly known as:  NORCO Take 1 tablet by mouth every 8 (eight) hours as needed.   IFEREX 150 150 MG capsule Generic drug:  iron polysaccharides TAKE ONE (1) CAPSULE EACH DAY   JANUVIA 100 MG tablet Generic drug:  sitaGLIPtin TAKE ONE (1) TABLET EACH DAY   levothyroxine 50  MCG tablet Commonly known as:  SYNTHROID, LEVOTHROID TAKE ONE TABLET EACH MORNING BEFORE BREAKFAST   metFORMIN 1000 MG tablet Commonly known as:  GLUCOPHAGE TAKE ONE TABLET TWICE A DAY WITH FOOD   metoprolol succinate 100 MG 24 hr tablet Commonly known as:  TOPROL-XL TAKE ONE (1) TABLET EACH DAY   NAMENDA XR 28 MG Cp24 24 hr capsule Generic drug:  memantine TAKE ONE (1) CAPSULE EACH DAY   NIFEdipine 60 MG 24 hr tablet Commonly known as:  PROCARDIA-XL/ADALAT CC Take 2 tablets (120 mg total) by mouth daily.   nitroGLYCERIN 0.1 mg/hr patch Commonly known as:  NITRODUR - Dosed in mg/24 hr APPLY 1 PATCH DAILY AS NEEDED   OLANZapine 5 MG tablet Commonly known as:  ZYPREXA TAKE ONE TABLET DAILY AT BEDTIME   pantoprazole 40 MG tablet Commonly known as:  PROTONIX TAKE ONE TABLET BY MOUTH TWICE DAILY   potassium chloride 10 MEQ tablet Commonly known as:  K-DUR TAKE ONE TABLET BY MOUTH TWICE DAILY   rosuvastatin 20 MG tablet Commonly known as:  CRESTOR TAKE ONE (1) TABLET EACH DAY   valsartan 320 MG tablet Commonly known as:  DIOVAN TAKE ONE (1) TABLET EACH DAY   VENTOLIN HFA 108 (90 Base) MCG/ACT inhaler Generic drug:  albuterol USE 2 PUFFS EVERY 6 HOURS AS NEEDED   Vitamin D (Ergocalciferol) 50000 units Caps capsule Commonly known as:  DRISDOL Take 1 capsule (50,000 Units total) by mouth 2 (two) times a week.   zolpidem 10 MG tablet Commonly known as:  AMBIEN TAKE ONE TABLET AT BEDTIME AS NEEDED          Objective:    BP (!) 144/82 (BP Location: Right Wrist, Patient Position: Sitting)   Pulse 75   Temp 97.1 F (36.2 C) (Oral)   Ht 5\' 4"  (1.626 m)   Wt 238 lb 3.2 oz (108 kg)   BMI 40.89 kg/m   Allergies  Allergen Reactions  . Codeine Nausea Only  . Amoxicillin Rash    Physical Exam  Constitutional: She is oriented to person, place, and time. She appears well-developed and well-nourished.  HENT:  Head: Normocephalic and atraumatic.  Right Ear:  Tympanic membrane, external ear and ear canal normal.  Left Ear: Tympanic membrane, external ear and ear canal normal.  Nose: Nose normal. No rhinorrhea.  Mouth/Throat: Oropharynx is clear and moist and mucous membranes are normal. No oropharyngeal exudate or posterior oropharyngeal erythema.  Eyes: Conjunctivae and EOM are normal. Pupils are equal, round, and reactive to light.  Neck: Normal range of motion. Neck supple.  Cardiovascular: Normal rate, regular rhythm, normal heart sounds and intact distal pulses.   Pulmonary/Chest: Effort normal and breath sounds normal.  Abdominal: Soft. Bowel sounds are normal.  Neurological: She is alert and oriented to person, place, and time. She has normal reflexes.  Skin: Skin is warm and dry. No rash noted.  Psychiatric: She has a normal mood and affect. Her behavior is normal. Judgment and thought content normal.  Nursing note and vitals reviewed.   Results for orders placed or performed during the hospital encounter of 27/78/24  Basic metabolic panel  Result Value Ref Range   Sodium 137 135 - 145 mmol/L   Potassium 4.1 3.5 - 5.1 mmol/L   Chloride 104 101 - 111 mmol/L   CO2 25 22 - 32 mmol/L   Glucose, Bld 191 (H) 65 - 99 mg/dL   BUN 19 6 - 20 mg/dL   Creatinine, Ser 1.22 (H) 0.44 - 1.00 mg/dL   Calcium 9.6 8.9 - 10.3 mg/dL   GFR calc non Af Amer 45 (L) >60 mL/min   GFR calc Af Amer 52 (L) >60 mL/min   Anion gap 8 5 - 15      Assessment & Plan:   1. Essential hypertension - cloNIDine (CATAPRES) 0.1 MG tablet; Take 1 tablet (0.1 mg total) by mouth 3 (three) times daily. Take 1 extra tablet per day if blood pressure 180/110 is seen on home monitor.  Dispense: 120 tablet; Refill: 5 Adjust nifedipine to 120 mg per day   Current Outpatient Prescriptions:  .  ALPRAZolam (XANAX) 0.5 MG tablet, TAKE ONE TABLET 3 TIMES A DAY AS NEEDED., Disp: 270 tablet, Rfl: 1 .  amLODipine (NORVASC) 10 MG tablet, TAKE ONE (1) TABLET EACH DAY, Disp: 30 tablet,  Rfl: 4 .  aspirin EC 81 MG tablet, Take 81 mg by mouth daily., Disp: , Rfl:  .  cloNIDine (CATAPRES) 0.1 MG tablet, Take 1 tablet (0.1 mg total) by mouth 3 (three) times daily. Take 1 extra tablet per day if blood pressure 180/110 is seen on home monitor., Disp: 120 tablet, Rfl: 5 .  donepezil (ARICEPT) 10 MG tablet, TAKE ONE TABLET DAILY AT BEDTIME, Disp: 90 tablet, Rfl: 1 .  DULoxetine (CYMBALTA) 60 MG capsule, TAKE ONE (1) CAPSULE EACH DAY, Disp: 90 capsule, Rfl: 3 .  fenofibrate micronized (LOFIBRA) 134 MG capsule, TAKE 1 CAPSULE EVERY MORNING BEFORE BREAKFAST, Disp: 30 capsule, Rfl: 3 .  fluorouracil (EFUDEX) 5 % cream, Apply 1 application topically 2 (two) times daily as needed (Use on face)., Disp: , Rfl:  .  furosemide (LASIX) 20 MG tablet, Take 1 tablet (20 mg total) by mouth daily., Disp: 30 tablet, Rfl: 11 .  HYDROcodone-acetaminophen (NORCO) 10-325 MG tablet, Take 1 tablet by mouth every 8 (eight) hours as needed., Disp: 90 tablet, Rfl: 0 .  HYDROcodone-acetaminophen (NORCO) 10-325 MG tablet, Take 1 tablet by mouth every 8 (eight) hours as needed., Disp: 90 tablet, Rfl: 0 .  IFEREX 150 150 MG capsule, TAKE ONE (1) CAPSULE EACH DAY, Disp: 90 capsule, Rfl: 3 .  JANUVIA 100 MG tablet, TAKE ONE (1) TABLET EACH DAY, Disp: 90 tablet, Rfl: 3 .  levothyroxine (SYNTHROID, LEVOTHROID) 50 MCG tablet, TAKE ONE TABLET EACH MORNING BEFORE BREAKFAST, Disp: 90 tablet, Rfl: 3 .  metFORMIN (GLUCOPHAGE) 1000 MG tablet, TAKE ONE TABLET TWICE A DAY WITH FOOD, Disp: 180 tablet, Rfl: 0 .  metoprolol succinate (TOPROL-XL) 100 MG 24 hr tablet, TAKE ONE (1) TABLET EACH DAY, Disp: 90 tablet, Rfl: 3 .  NAMENDA XR 28 MG CP24 24 hr capsule, TAKE ONE (1) CAPSULE EACH DAY, Disp: 90 capsule, Rfl:  1 .  NIFEdipine (PROCARDIA-XL/ADALAT CC) 60 MG 24 hr tablet, Take 2 tablets (120 mg total) by mouth daily., Disp: 60 tablet, Rfl: 6 .  nitroGLYCERIN (NITRODUR - DOSED IN MG/24 HR) 0.1 mg/hr patch, APPLY 1 PATCH DAILY AS NEEDED,  Disp: 30 patch, Rfl: 2 .  OLANZapine (ZYPREXA) 5 MG tablet, TAKE ONE TABLET DAILY AT BEDTIME, Disp: 90 tablet, Rfl: 1 .  pantoprazole (PROTONIX) 40 MG tablet, TAKE ONE TABLET BY MOUTH TWICE DAILY, Disp: 180 tablet, Rfl: 3 .  potassium chloride (K-DUR) 10 MEQ tablet, TAKE ONE TABLET BY MOUTH TWICE DAILY, Disp: 180 tablet, Rfl: 1 .  rosuvastatin (CRESTOR) 20 MG tablet, TAKE ONE (1) TABLET EACH DAY, Disp: 90 tablet, Rfl: 1 .  valsartan (DIOVAN) 320 MG tablet, TAKE ONE (1) TABLET EACH DAY, Disp: 90 tablet, Rfl: 1 .  VENTOLIN HFA 108 (90 Base) MCG/ACT inhaler, USE 2 PUFFS EVERY 6 HOURS AS NEEDED, Disp: 54 g, Rfl: 3 .  Vitamin D, Ergocalciferol, (DRISDOL) 50000 units CAPS capsule, Take 1 capsule (50,000 Units total) by mouth 2 (two) times a week., Disp: 24 capsule, Rfl: 2 .  zolpidem (AMBIEN) 10 MG tablet, TAKE ONE TABLET AT BEDTIME AS NEEDED, Disp: 90 tablet, Rfl: 1   Continue all other maintenance medications as listed above.  Follow up plan: Return in about 4 weeks (around 02/25/2017) for recheck BP.  Educational handout given for  Kramer PA-C [facility] [address]  [place], [place] [address] [phone]   01/28/2017, 3:05 PM

## 2017-01-28 NOTE — Patient Instructions (Signed)
DASH Eating Plan DASH stands for "Dietary Approaches to Stop Hypertension." The DASH eating plan is a healthy eating plan that has been shown to reduce high blood pressure (hypertension). It may also reduce your risk for type 2 diabetes, heart disease, and stroke. The DASH eating plan may also help with weight loss. What are tips for following this plan? General guidelines  Avoid eating more than 2,300 mg (milligrams) of salt (sodium) a day. If you have hypertension, you may need to reduce your sodium intake to 1,500 mg a day.  Limit alcohol intake to no more than 1 drink a day for nonpregnant women and 2 drinks a day for men. One drink equals 12 oz of beer, 5 oz of wine, or 1 oz of hard liquor.  Work with your health care provider to maintain a healthy body weight or to lose weight. Ask what an ideal weight is for you.  Get at least 30 minutes of exercise that causes your heart to beat faster (aerobic exercise) most days of the week. Activities may include walking, swimming, or biking.  Work with your health care provider or diet and nutrition specialist (dietitian) to adjust your eating plan to your individual calorie needs. Reading food labels  Check food labels for the amount of sodium per serving. Choose foods with less than 5 percent of the Daily Value of sodium. Generally, foods with less than 300 mg of sodium per serving fit into this eating plan.  To find whole grains, look for the word "whole" as the first word in the ingredient list. Shopping  Buy products labeled as "low-sodium" or "no salt added."  Buy fresh foods. Avoid canned foods and premade or frozen meals. Cooking  Avoid adding salt when cooking. Use salt-free seasonings or herbs instead of table salt or sea salt. Check with your health care provider or pharmacist before using salt substitutes.  Do not fry foods. Cook foods using healthy methods such as baking, boiling, grilling, and broiling instead.  Cook with  heart-healthy oils, such as olive, canola, soybean, or sunflower oil. Meal planning   Eat a balanced diet that includes: ? 5 or more servings of fruits and vegetables each day. At each meal, try to fill half of your plate with fruits and vegetables. ? Up to 6-8 servings of whole grains each day. ? Less than 6 oz of lean meat, poultry, or fish each day. A 3-oz serving of meat is about the same size as a deck of cards. One egg equals 1 oz. ? 2 servings of low-fat dairy each day. ? A serving of nuts, seeds, or beans 5 times each week. ? Heart-healthy fats. Healthy fats called Omega-3 fatty acids are found in foods such as flaxseeds and coldwater fish, like sardines, salmon, and mackerel.  Limit how much you eat of the following: ? Canned or prepackaged foods. ? Food that is high in trans fat, such as fried foods. ? Food that is high in saturated fat, such as fatty meat. ? Sweets, desserts, sugary drinks, and other foods with added sugar. ? Full-fat dairy products.  Do not salt foods before eating.  Try to eat at least 2 vegetarian meals each week.  Eat more home-cooked food and less restaurant, buffet, and fast food.  When eating at a restaurant, ask that your food be prepared with less salt or no salt, if possible. What foods are recommended? The items listed may not be a complete list. Talk with your dietitian about what   dietary choices are best for you. Grains Whole-grain or whole-wheat bread. Whole-grain or whole-wheat pasta. Brown rice. Oatmeal. Quinoa. Bulgur. Whole-grain and low-sodium cereals. Pita bread. Low-fat, low-sodium crackers. Whole-wheat flour tortillas. Vegetables Fresh or frozen vegetables (raw, steamed, roasted, or grilled). Low-sodium or reduced-sodium tomato and vegetable juice. Low-sodium or reduced-sodium tomato sauce and tomato paste. Low-sodium or reduced-sodium canned vegetables. Fruits All fresh, dried, or frozen fruit. Canned fruit in natural juice (without  added sugar). Meat and other protein foods Skinless chicken or turkey. Ground chicken or turkey. Pork with fat trimmed off. Fish and seafood. Egg whites. Dried beans, peas, or lentils. Unsalted nuts, nut butters, and seeds. Unsalted canned beans. Lean cuts of beef with fat trimmed off. Low-sodium, lean deli meat. Dairy Low-fat (1%) or fat-free (skim) milk. Fat-free, low-fat, or reduced-fat cheeses. Nonfat, low-sodium ricotta or cottage cheese. Low-fat or nonfat yogurt. Low-fat, low-sodium cheese. Fats and oils Soft margarine without trans fats. Vegetable oil. Low-fat, reduced-fat, or light mayonnaise and salad dressings (reduced-sodium). Canola, safflower, olive, soybean, and sunflower oils. Avocado. Seasoning and other foods Herbs. Spices. Seasoning mixes without salt. Unsalted popcorn and pretzels. Fat-free sweets. What foods are not recommended? The items listed may not be a complete list. Talk with your dietitian about what dietary choices are best for you. Grains Baked goods made with fat, such as croissants, muffins, or some breads. Dry pasta or rice meal packs. Vegetables Creamed or fried vegetables. Vegetables in a cheese sauce. Regular canned vegetables (not low-sodium or reduced-sodium). Regular canned tomato sauce and paste (not low-sodium or reduced-sodium). Regular tomato and vegetable juice (not low-sodium or reduced-sodium). Pickles. Olives. Fruits Canned fruit in a light or heavy syrup. Fried fruit. Fruit in cream or butter sauce. Meat and other protein foods Fatty cuts of meat. Ribs. Fried meat. Bacon. Sausage. Bologna and other processed lunch meats. Salami. Fatback. Hotdogs. Bratwurst. Salted nuts and seeds. Canned beans with added salt. Canned or smoked fish. Whole eggs or egg yolks. Chicken or turkey with skin. Dairy Whole or 2% milk, cream, and half-and-half. Whole or full-fat cream cheese. Whole-fat or sweetened yogurt. Full-fat cheese. Nondairy creamers. Whipped toppings.  Processed cheese and cheese spreads. Fats and oils Butter. Stick margarine. Lard. Shortening. Ghee. Bacon fat. Tropical oils, such as coconut, palm kernel, or palm oil. Seasoning and other foods Salted popcorn and pretzels. Onion salt, garlic salt, seasoned salt, table salt, and sea salt. Worcestershire sauce. Tartar sauce. Barbecue sauce. Teriyaki sauce. Soy sauce, including reduced-sodium. Steak sauce. Canned and packaged gravies. Fish sauce. Oyster sauce. Cocktail sauce. Horseradish that you find on the shelf. Ketchup. Mustard. Meat flavorings and tenderizers. Bouillon cubes. Hot sauce and Tabasco sauce. Premade or packaged marinades. Premade or packaged taco seasonings. Relishes. Regular salad dressings. Where to find more information:  National Heart, Lung, and Blood Institute: www.nhlbi.nih.gov  American Heart Association: www.heart.org Summary  The DASH eating plan is a healthy eating plan that has been shown to reduce high blood pressure (hypertension). It may also reduce your risk for type 2 diabetes, heart disease, and stroke.  With the DASH eating plan, you should limit salt (sodium) intake to 2,300 mg a day. If you have hypertension, you may need to reduce your sodium intake to 1,500 mg a day.  When on the DASH eating plan, aim to eat more fresh fruits and vegetables, whole grains, lean proteins, low-fat dairy, and heart-healthy fats.  Work with your health care provider or diet and nutrition specialist (dietitian) to adjust your eating plan to your individual   calorie needs. This information is not intended to replace advice given to you by your health care provider. Make sure you discuss any questions you have with your health care provider. Document Released: 08/19/2011 Document Revised: 08/23/2016 Document Reviewed: 08/23/2016 Elsevier Interactive Patient Education  2017 [employer] Inc.  

## 2017-01-28 NOTE — Addendum Note (Signed)
Addended by: Antonietta Barcelona D on: 01/28/2017 03:14 PM   Modules accepted: Orders

## 2017-02-15 ENCOUNTER — Telehealth: Payer: Self-pay | Admitting: Physician Assistant

## 2017-02-28 ENCOUNTER — Ambulatory Visit: Payer: Medicare HMO | Admitting: Physician Assistant

## 2017-03-01 ENCOUNTER — Ambulatory Visit (INDEPENDENT_AMBULATORY_CARE_PROVIDER_SITE_OTHER): Payer: Medicare HMO | Admitting: Physician Assistant

## 2017-03-01 ENCOUNTER — Encounter: Payer: Self-pay | Admitting: Physician Assistant

## 2017-03-01 VITALS — BP 159/86 | HR 68 | Temp 97.6°F | Ht 64.0 in | Wt 232.0 lb

## 2017-03-01 DIAGNOSIS — I10 Essential (primary) hypertension: Secondary | ICD-10-CM | POA: Diagnosis not present

## 2017-03-01 DIAGNOSIS — IMO0001 Reserved for inherently not codable concepts without codable children: Secondary | ICD-10-CM

## 2017-03-01 DIAGNOSIS — E782 Mixed hyperlipidemia: Secondary | ICD-10-CM

## 2017-03-01 DIAGNOSIS — E1165 Type 2 diabetes mellitus with hyperglycemia: Secondary | ICD-10-CM

## 2017-03-01 DIAGNOSIS — M5136 Other intervertebral disc degeneration, lumbar region: Secondary | ICD-10-CM

## 2017-03-01 NOTE — Progress Notes (Signed)
BP (!) 159/86   Pulse 68   Temp 97.6 F (36.4 C) (Oral)   Ht 5\' 4"  (1.626 m)   Wt 232 lb (105.2 kg)   BMI 39.82 kg/m    Subjective:    Patient ID: Kara Nunez, female    DOB: 1951/02/20, 66 y.o.   MRN: [id number]  HPI: Kara Nunez is a 66 y.o. female presenting on 03/01/2017 for Hypertension (4 week follow up )  This patient comes in for periodic recheck on medications and conditions including elevated blood pressure, chronic back pain, known heart disease, diabetes well controlled. Overall she is doing well with her current regimen. She is tolerating her medications exceptionally well her blood pressure readings have been intensely improved. I commended her on these efforts and recommended that she continue working hard on this..   All medications are reviewed today. There are no reports of any problems with the medications. All of the medical conditions are reviewed and updated.  Lab work is reviewed and will be ordered as medically necessary. There are no new problems reported with today's visit.   Relevant past medical, surgical, family and social history reviewed and updated as indicated. Allergies and medications reviewed and updated.  Past Medical History:  Diagnosis Date  . Allergic rhinitis   . Ascending aortic aneurysm ([place])    Followed by Dr. [name]  . Chronic back pain   . Coronary atherosclerosis of native coronary artery    Prior evaluation with in [facility] - details not clear   . Degenerative disc disease   . Essential hypertension, benign   . Hyperlipidemia   . Insomnia   . Type 2 diabetes mellitus ([place])     Past Surgical History:  Procedure Laterality Date  . CHOLECYSTECTOMY    . TUBAL LIGATION      Review of Systems  Constitutional: Negative.  Negative for activity change, fatigue and fever.  HENT: Negative.   Eyes: Negative.   Respiratory: Negative.  Negative for cough.   Cardiovascular: Negative.  Negative for chest pain.    Gastrointestinal: Negative.  Negative for abdominal pain.  Endocrine: Negative.   Genitourinary: Negative.  Negative for dysuria.  Musculoskeletal: Negative.   Skin: Negative.   Neurological: Negative.     Allergies as of 03/01/2017      Reactions   Codeine Nausea Only   Amoxicillin Rash      Medication List       Accurate as of 03/01/17  2:58 PM. Always use your most recent med list.          ALPRAZolam 0.5 MG tablet Commonly known as:  XANAX TAKE ONE TABLET 3 TIMES A DAY AS NEEDED.   amLODipine 10 MG tablet Commonly known as:  NORVASC TAKE ONE (1) TABLET EACH DAY   aspirin EC 81 MG tablet Take 81 mg by mouth daily.   cloNIDine 0.1 MG tablet Commonly known as:  CATAPRES Take 1 tablet (0.1 mg total) by mouth 3 (three) times daily. Take 1 extra tablet per day if blood pressure 180/110 is seen on home monitor.   donepezil 10 MG tablet Commonly known as:  ARICEPT TAKE ONE TABLET DAILY AT BEDTIME   DULoxetine 60 MG capsule Commonly known as:  CYMBALTA TAKE ONE (1) CAPSULE EACH DAY   fenofibrate micronized 134 MG capsule Commonly known as:  LOFIBRA TAKE 1 CAPSULE EVERY MORNING BEFORE BREAKFAST   fluorouracil 5 % cream Commonly known as:  EFUDEX Apply 1 application topically 2 (two) times  daily as needed (Use on face).   furosemide 20 MG tablet Commonly known as:  LASIX Take 1 tablet (20 mg total) by mouth daily.   HYDROcodone-acetaminophen 10-325 MG tablet Commonly known as:  NORCO Take 1 tablet by mouth every 8 (eight) hours as needed.   HYDROcodone-acetaminophen 10-325 MG tablet Commonly known as:  NORCO Take 1 tablet by mouth every 8 (eight) hours as needed.   IFEREX 150 150 MG capsule Generic drug:  iron polysaccharides TAKE ONE (1) CAPSULE EACH DAY   JANUVIA 100 MG tablet Generic drug:  sitaGLIPtin TAKE ONE (1) TABLET EACH DAY   levothyroxine 50 MCG tablet Commonly known as:  SYNTHROID, LEVOTHROID TAKE ONE TABLET EACH MORNING BEFORE  BREAKFAST   metFORMIN 1000 MG tablet Commonly known as:  GLUCOPHAGE TAKE ONE TABLET TWICE A DAY WITH FOOD   metoprolol succinate 100 MG 24 hr tablet Commonly known as:  TOPROL-XL TAKE ONE (1) TABLET EACH DAY   NAMENDA XR 28 MG Cp24 24 hr capsule Generic drug:  memantine TAKE ONE (1) CAPSULE EACH DAY   NIFEdipine 60 MG 24 hr tablet Commonly known as:  PROCARDIA-XL/ADALAT CC Take 2 tablets (120 mg total) by mouth daily.   nitroGLYCERIN 0.1 mg/hr patch Commonly known as:  NITRODUR - Dosed in mg/24 hr APPLY 1 PATCH DAILY AS NEEDED   OLANZapine 5 MG tablet Commonly known as:  ZYPREXA TAKE ONE TABLET DAILY AT BEDTIME   pantoprazole 40 MG tablet Commonly known as:  PROTONIX TAKE ONE TABLET BY MOUTH TWICE DAILY   potassium chloride 10 MEQ tablet Commonly known as:  K-DUR TAKE ONE TABLET BY MOUTH TWICE DAILY   rosuvastatin 20 MG tablet Commonly known as:  CRESTOR TAKE ONE (1) TABLET EACH DAY   valsartan 320 MG tablet Commonly known as:  DIOVAN TAKE ONE (1) TABLET EACH DAY   VENTOLIN HFA 108 (90 Base) MCG/ACT inhaler Generic drug:  albuterol USE 2 PUFFS EVERY 6 HOURS AS NEEDED   Vitamin D (Ergocalciferol) 50000 units Caps capsule Commonly known as:  DRISDOL Take 1 capsule (50,000 Units total) by mouth 2 (two) times a week.   zolpidem 10 MG tablet Commonly known as:  AMBIEN TAKE ONE TABLET AT BEDTIME AS NEEDED          Objective:    BP (!) 159/86   Pulse 68   Temp 97.6 F (36.4 C) (Oral)   Ht 5\' 4"  (1.626 m)   Wt 232 lb (105.2 kg)   BMI 39.82 kg/m   Allergies  Allergen Reactions  . Codeine Nausea Only  . Amoxicillin Rash    Physical Exam  Constitutional: She is oriented to person, place, and time. She appears well-developed and well-nourished.  HENT:  Head: Normocephalic and atraumatic.  Eyes: Conjunctivae and EOM are normal. Pupils are equal, round, and reactive to light.  Cardiovascular: Normal rate, regular rhythm, normal heart sounds and intact  distal pulses.   Pulmonary/Chest: Effort normal and breath sounds normal.  Abdominal: Soft. Bowel sounds are normal.  Neurological: She is alert and oriented to person, place, and time. She has normal reflexes.  Skin: Skin is warm and dry. No rash noted.  Psychiatric: She has a normal mood and affect. Her behavior is normal. Judgment and thought content normal.  Nursing note and vitals reviewed.   Results for orders placed or performed during the hospital encounter of 12/75/17  Basic metabolic panel  Result Value Ref Range   Sodium 137 135 - 145 mmol/L   Potassium  4.1 3.5 - 5.1 mmol/L   Chloride 104 101 - 111 mmol/L   CO2 25 22 - 32 mmol/L   Glucose, Bld 191 (H) 65 - 99 mg/dL   BUN 19 6 - 20 mg/dL   Creatinine, Ser 1.22 (H) 0.44 - 1.00 mg/dL   Calcium 9.6 8.9 - 10.3 mg/dL   GFR calc non Af Amer 45 (L) >60 mL/min   GFR calc Af Amer 52 (L) >60 mL/min   Anion gap 8 5 - 15      Assessment & Plan:   1. Uncontrolled type 2 diabetes mellitus without complication, without long-term current use of insulin ([place])  2. Essential hypertension This patient has had a remarkable improvement in her blood pressure. She was staying very elevated in the 165-537 range systolically. She is feeling very good on this medication regimen.  3. DDD (degenerative disc disease), lumbar  4. Mixed hyperlipidemia   Current Outpatient Prescriptions:  .  ALPRAZolam (XANAX) 0.5 MG tablet, TAKE ONE TABLET 3 TIMES A DAY AS NEEDED., Disp: 270 tablet, Rfl: 1 .  amLODipine (NORVASC) 10 MG tablet, TAKE ONE (1) TABLET EACH DAY, Disp: 30 tablet, Rfl: 4 .  aspirin EC 81 MG tablet, Take 81 mg by mouth daily., Disp: , Rfl:  .  cloNIDine (CATAPRES) 0.1 MG tablet, Take 1 tablet (0.1 mg total) by mouth 3 (three) times daily. Take 1 extra tablet per day if blood pressure 180/110 is seen on home monitor., Disp: 360 tablet, Rfl: 1 .  donepezil (ARICEPT) 10 MG tablet, TAKE ONE TABLET DAILY AT BEDTIME, Disp: 90 tablet, Rfl: 1 .   DULoxetine (CYMBALTA) 60 MG capsule, TAKE ONE (1) CAPSULE EACH DAY, Disp: 90 capsule, Rfl: 3 .  fenofibrate micronized (LOFIBRA) 134 MG capsule, TAKE 1 CAPSULE EVERY MORNING BEFORE BREAKFAST, Disp: 30 capsule, Rfl: 3 .  fluorouracil (EFUDEX) 5 % cream, Apply 1 application topically 2 (two) times daily as needed (Use on face)., Disp: , Rfl:  .  furosemide (LASIX) 20 MG tablet, Take 1 tablet (20 mg total) by mouth daily., Disp: 30 tablet, Rfl: 11 .  HYDROcodone-acetaminophen (NORCO) 10-325 MG tablet, Take 1 tablet by mouth every 8 (eight) hours as needed., Disp: 90 tablet, Rfl: 0 .  HYDROcodone-acetaminophen (NORCO) 10-325 MG tablet, Take 1 tablet by mouth every 8 (eight) hours as needed., Disp: 90 tablet, Rfl: 0 .  IFEREX 150 150 MG capsule, TAKE ONE (1) CAPSULE EACH DAY, Disp: 90 capsule, Rfl: 3 .  JANUVIA 100 MG tablet, TAKE ONE (1) TABLET EACH DAY, Disp: 90 tablet, Rfl: 3 .  levothyroxine (SYNTHROID, LEVOTHROID) 50 MCG tablet, TAKE ONE TABLET EACH MORNING BEFORE BREAKFAST, Disp: 90 tablet, Rfl: 3 .  metFORMIN (GLUCOPHAGE) 1000 MG tablet, TAKE ONE TABLET TWICE A DAY WITH FOOD, Disp: 180 tablet, Rfl: 0 .  metoprolol succinate (TOPROL-XL) 100 MG 24 hr tablet, TAKE ONE (1) TABLET EACH DAY, Disp: 90 tablet, Rfl: 3 .  NAMENDA XR 28 MG CP24 24 hr capsule, TAKE ONE (1) CAPSULE EACH DAY, Disp: 90 capsule, Rfl: 1 .  NIFEdipine (PROCARDIA-XL/ADALAT CC) 60 MG 24 hr tablet, Take 2 tablets (120 mg total) by mouth daily., Disp: 180 tablet, Rfl: 1 .  nitroGLYCERIN (NITRODUR - DOSED IN MG/24 HR) 0.1 mg/hr patch, APPLY 1 PATCH DAILY AS NEEDED, Disp: 30 patch, Rfl: 2 .  OLANZapine (ZYPREXA) 5 MG tablet, TAKE ONE TABLET DAILY AT BEDTIME, Disp: 90 tablet, Rfl: 1 .  pantoprazole (PROTONIX) 40 MG tablet, TAKE ONE TABLET BY MOUTH TWICE  DAILY, Disp: 180 tablet, Rfl: 3 .  potassium chloride (K-DUR) 10 MEQ tablet, TAKE ONE TABLET BY MOUTH TWICE DAILY, Disp: 180 tablet, Rfl: 1 .  rosuvastatin (CRESTOR) 20 MG tablet, TAKE ONE  (1) TABLET EACH DAY, Disp: 90 tablet, Rfl: 1 .  valsartan (DIOVAN) 320 MG tablet, TAKE ONE (1) TABLET EACH DAY, Disp: 90 tablet, Rfl: 1 .  VENTOLIN HFA 108 (90 Base) MCG/ACT inhaler, USE 2 PUFFS EVERY 6 HOURS AS NEEDED, Disp: 54 g, Rfl: 3 .  Vitamin D, Ergocalciferol, (DRISDOL) 50000 units CAPS capsule, Take 1 capsule (50,000 Units total) by mouth 2 (two) times a week., Disp: 24 capsule, Rfl: 2 .  zolpidem (AMBIEN) 10 MG tablet, TAKE ONE TABLET AT BEDTIME AS NEEDED, Disp: 90 tablet, Rfl: 1  Continue all other maintenance medications as listed above.  Follow up plan: Return in about 3 months (around 06/01/2017) for recheck.  Educational handout given for dash diet  Terald Sleeper PA-C [facility] [address]  [place], [place] [address] [phone]   03/01/2017, 2:58 PM

## 2017-03-01 NOTE — Patient Instructions (Signed)
DASH Eating Plan DASH stands for "Dietary Approaches to Stop Hypertension." The DASH eating plan is a healthy eating plan that has been shown to reduce high blood pressure (hypertension). It may also reduce your risk for type 2 diabetes, heart disease, and stroke. The DASH eating plan may also help with weight loss. What are tips for following this plan? General guidelines  Avoid eating more than 2,300 mg (milligrams) of salt (sodium) a day. If you have hypertension, you may need to reduce your sodium intake to 1,500 mg a day.  Limit alcohol intake to no more than 1 drink a day for nonpregnant women and 2 drinks a day for men. One drink equals 12 oz of beer, 5 oz of wine, or 1 oz of hard liquor.  Work with your health care provider to maintain a healthy body weight or to lose weight. Ask what an ideal weight is for you.  Get at least 30 minutes of exercise that causes your heart to beat faster (aerobic exercise) most days of the week. Activities may include walking, swimming, or biking.  Work with your health care provider or diet and nutrition specialist (dietitian) to adjust your eating plan to your individual calorie needs. Reading food labels  Check food labels for the amount of sodium per serving. Choose foods with less than 5 percent of the Daily Value of sodium. Generally, foods with less than 300 mg of sodium per serving fit into this eating plan.  To find whole grains, look for the word "whole" as the first word in the ingredient list. Shopping  Buy products labeled as "low-sodium" or "no salt added."  Buy fresh foods. Avoid canned foods and premade or frozen meals. Cooking  Avoid adding salt when cooking. Use salt-free seasonings or herbs instead of table salt or sea salt. Check with your health care provider or pharmacist before using salt substitutes.  Do not fry foods. Cook foods using healthy methods such as baking, boiling, grilling, and broiling instead.  Cook with  heart-healthy oils, such as olive, canola, soybean, or sunflower oil. Meal planning   Eat a balanced diet that includes: ? 5 or more servings of fruits and vegetables each day. At each meal, try to fill half of your plate with fruits and vegetables. ? Up to 6-8 servings of whole grains each day. ? Less than 6 oz of lean meat, poultry, or fish each day. A 3-oz serving of meat is about the same size as a deck of cards. One egg equals 1 oz. ? 2 servings of low-fat dairy each day. ? A serving of nuts, seeds, or beans 5 times each week. ? Heart-healthy fats. Healthy fats called Omega-3 fatty acids are found in foods such as flaxseeds and coldwater fish, like sardines, salmon, and mackerel.  Limit how much you eat of the following: ? Canned or prepackaged foods. ? Food that is high in trans fat, such as fried foods. ? Food that is high in saturated fat, such as fatty meat. ? Sweets, desserts, sugary drinks, and other foods with added sugar. ? Full-fat dairy products.  Do not salt foods before eating.  Try to eat at least 2 vegetarian meals each week.  Eat more home-cooked food and less restaurant, buffet, and fast food.  When eating at a restaurant, ask that your food be prepared with less salt or no salt, if possible. What foods are recommended? The items listed may not be a complete list. Talk with your dietitian about what   dietary choices are best for you. Grains Whole-grain or whole-wheat bread. Whole-grain or whole-wheat pasta. Brown rice. Oatmeal. Quinoa. Bulgur. Whole-grain and low-sodium cereals. Pita bread. Low-fat, low-sodium crackers. Whole-wheat flour tortillas. Vegetables Fresh or frozen vegetables (raw, steamed, roasted, or grilled). Low-sodium or reduced-sodium tomato and vegetable juice. Low-sodium or reduced-sodium tomato sauce and tomato paste. Low-sodium or reduced-sodium canned vegetables. Fruits All fresh, dried, or frozen fruit. Canned fruit in natural juice (without  added sugar). Meat and other protein foods Skinless chicken or turkey. Ground chicken or turkey. Pork with fat trimmed off. Fish and seafood. Egg whites. Dried beans, peas, or lentils. Unsalted nuts, nut butters, and seeds. Unsalted canned beans. Lean cuts of beef with fat trimmed off. Low-sodium, lean deli meat. Dairy Low-fat (1%) or fat-free (skim) milk. Fat-free, low-fat, or reduced-fat cheeses. Nonfat, low-sodium ricotta or cottage cheese. Low-fat or nonfat yogurt. Low-fat, low-sodium cheese. Fats and oils Soft margarine without trans fats. Vegetable oil. Low-fat, reduced-fat, or light mayonnaise and salad dressings (reduced-sodium). Canola, safflower, olive, soybean, and sunflower oils. Avocado. Seasoning and other foods Herbs. Spices. Seasoning mixes without salt. Unsalted popcorn and pretzels. Fat-free sweets. What foods are not recommended? The items listed may not be a complete list. Talk with your dietitian about what dietary choices are best for you. Grains Baked goods made with fat, such as croissants, muffins, or some breads. Dry pasta or rice meal packs. Vegetables Creamed or fried vegetables. Vegetables in a cheese sauce. Regular canned vegetables (not low-sodium or reduced-sodium). Regular canned tomato sauce and paste (not low-sodium or reduced-sodium). Regular tomato and vegetable juice (not low-sodium or reduced-sodium). Pickles. Olives. Fruits Canned fruit in a light or heavy syrup. Fried fruit. Fruit in cream or butter sauce. Meat and other protein foods Fatty cuts of meat. Ribs. Fried meat. Bacon. Sausage. Bologna and other processed lunch meats. Salami. Fatback. Hotdogs. Bratwurst. Salted nuts and seeds. Canned beans with added salt. Canned or smoked fish. Whole eggs or egg yolks. Chicken or turkey with skin. Dairy Whole or 2% milk, cream, and half-and-half. Whole or full-fat cream cheese. Whole-fat or sweetened yogurt. Full-fat cheese. Nondairy creamers. Whipped toppings.  Processed cheese and cheese spreads. Fats and oils Butter. Stick margarine. Lard. Shortening. Ghee. Bacon fat. Tropical oils, such as coconut, palm kernel, or palm oil. Seasoning and other foods Salted popcorn and pretzels. Onion salt, garlic salt, seasoned salt, table salt, and sea salt. Worcestershire sauce. Tartar sauce. Barbecue sauce. Teriyaki sauce. Soy sauce, including reduced-sodium. Steak sauce. Canned and packaged gravies. Fish sauce. Oyster sauce. Cocktail sauce. Horseradish that you find on the shelf. Ketchup. Mustard. Meat flavorings and tenderizers. Bouillon cubes. Hot sauce and Tabasco sauce. Premade or packaged marinades. Premade or packaged taco seasonings. Relishes. Regular salad dressings. Where to find more information:  National Heart, Lung, and Blood Institute: www.nhlbi.nih.gov  American Heart Association: www.heart.org Summary  The DASH eating plan is a healthy eating plan that has been shown to reduce high blood pressure (hypertension). It may also reduce your risk for type 2 diabetes, heart disease, and stroke.  With the DASH eating plan, you should limit salt (sodium) intake to 2,300 mg a day. If you have hypertension, you may need to reduce your sodium intake to 1,500 mg a day.  When on the DASH eating plan, aim to eat more fresh fruits and vegetables, whole grains, lean proteins, low-fat dairy, and heart-healthy fats.  Work with your health care provider or diet and nutrition specialist (dietitian) to adjust your eating plan to your individual   calorie needs. This information is not intended to replace advice given to you by your health care provider. Make sure you discuss any questions you have with your health care provider. Document Released: 08/19/2011 Document Revised: 08/23/2016 Document Reviewed: 08/23/2016 Elsevier Interactive Patient Education  2017 [employer] Inc.  

## 2017-03-02 ENCOUNTER — Other Ambulatory Visit: Payer: Self-pay | Admitting: Physician Assistant

## 2017-03-02 ENCOUNTER — Telehealth: Payer: Self-pay | Admitting: Physician Assistant

## 2017-03-02 DIAGNOSIS — M5136 Other intervertebral disc degeneration, lumbar region: Secondary | ICD-10-CM

## 2017-03-02 MED ORDER — HYDROCODONE-ACETAMINOPHEN 10-325 MG PO TABS: 1.0000 | ORAL_TABLET | Freq: Three times a day (TID) | ORAL | 0 refills | Status: DC | PRN

## 2017-03-02 NOTE — Telephone Encounter (Signed)
Patient aware that rx up front ready to be picked up.

## 2017-03-31 DIAGNOSIS — G4733 Obstructive sleep apnea (adult) (pediatric): Secondary | ICD-10-CM | POA: Diagnosis not present

## 2017-04-04 ENCOUNTER — Telehealth: Payer: Self-pay | Admitting: Physician Assistant

## 2017-04-15 ENCOUNTER — Other Ambulatory Visit: Payer: Self-pay | Admitting: Physician Assistant

## 2017-04-25 DIAGNOSIS — E559 Vitamin D deficiency, unspecified: Secondary | ICD-10-CM | POA: Diagnosis not present

## 2017-04-25 DIAGNOSIS — R809 Proteinuria, unspecified: Secondary | ICD-10-CM | POA: Diagnosis not present

## 2017-04-25 DIAGNOSIS — D509 Iron deficiency anemia, unspecified: Secondary | ICD-10-CM | POA: Diagnosis not present

## 2017-04-25 DIAGNOSIS — Z79899 Other long term (current) drug therapy: Secondary | ICD-10-CM | POA: Diagnosis not present

## 2017-04-25 DIAGNOSIS — N183 Chronic kidney disease, stage 3 (moderate): Secondary | ICD-10-CM | POA: Diagnosis not present

## 2017-04-25 DIAGNOSIS — I10 Essential (primary) hypertension: Secondary | ICD-10-CM | POA: Diagnosis not present

## 2017-04-26 ENCOUNTER — Other Ambulatory Visit: Payer: Self-pay | Admitting: Physician Assistant

## 2017-04-26 NOTE — Telephone Encounter (Signed)
What is the name of the medication? Fenofibrate 134 #90 and Hydrocodone 10-325. Gave pharmacy all three RX's and he can't fill after the 3rd day because of DEA  Have you contacted your pharmacy to request a refill? YES  Which pharmacy would you like this sent to? [facility] in [place]   Patient notified that their request is being sent to the clinical staff for review and that they should receive a call once it is complete. If they do not receive a call within 24 hours they can check with their pharmacy or our office.

## 2017-04-26 NOTE — Telephone Encounter (Signed)
Pt got refills for [facility] today [facility]

## 2017-04-26 NOTE — Telephone Encounter (Signed)
Can we check with [facility] to see what is going on? She has some dementia.

## 2017-04-27 ENCOUNTER — Other Ambulatory Visit: Payer: Self-pay | Admitting: Physician Assistant

## 2017-04-27 DIAGNOSIS — R809 Proteinuria, unspecified: Secondary | ICD-10-CM | POA: Diagnosis not present

## 2017-04-27 DIAGNOSIS — I10 Essential (primary) hypertension: Secondary | ICD-10-CM | POA: Diagnosis not present

## 2017-04-27 DIAGNOSIS — N250 Renal osteodystrophy: Secondary | ICD-10-CM | POA: Diagnosis not present

## 2017-04-27 DIAGNOSIS — G4733 Obstructive sleep apnea (adult) (pediatric): Secondary | ICD-10-CM | POA: Diagnosis not present

## 2017-04-27 DIAGNOSIS — R600 Localized edema: Secondary | ICD-10-CM

## 2017-04-27 DIAGNOSIS — E1129 Type 2 diabetes mellitus with other diabetic kidney complication: Secondary | ICD-10-CM | POA: Diagnosis not present

## 2017-04-27 DIAGNOSIS — N183 Chronic kidney disease, stage 3 (moderate): Secondary | ICD-10-CM | POA: Diagnosis not present

## 2017-05-10 NOTE — Progress Notes (Signed)
Cardiology Office Note    Date:  05/11/2017   ID:  Kara Nunez, DOB 1951-02-16, MRN [id number]  PCP:  Terald Sleeper, PA-C  Cardiologist: Dr. Domenic Polite  No chief complaint on file.   History of Present Illness:  Kara Nunez is a 66 y.o. female with history of CAD with previous workup in [place] but details are not clear. Low risk ischemic testing in 2015. Ascending aortic aneurysm 4.7 cm by says CTA earlier this year followed by Dr. [name] asymptomatic. Last saw Dr. Domenic Polite 12/2016 at which time she had uncontrolled hypertension and he switched her Lasix to Aldactone. Also morbid obesity and HLD.  Patient comes in today because her blood pressures have been elevated. She says she never got the message to change her Lasix to Aldactone. She brings in readings of her blood pressure since January and they have been 170-200/92 100 and the afternoons. She is maxed out on most of her medications. When asked about her diet she says she follows a low-sodium diet but eats out daily at fast food restaurants. She also drinks 7-8 sodas daily as well as sweet tea. She says she develops chest tightness when her blood pressure is up. She can't find her sublingual nitroglycerin and is asking [place] to increase her nitroglycerin patches so she can do water aerobics. She saw Dr. Vertell Novak and he recommended she be checked here.She's worried about her HTN with her aneurysm.    Past Medical History:  Diagnosis Date  . Allergic rhinitis   . Ascending aortic aneurysm ([place])    Followed by Dr. [name]  . Chronic back pain   . Coronary atherosclerosis of native coronary artery    Prior evaluation with in [facility] - details not clear   . Degenerative disc disease   . Essential hypertension, benign   . Hyperlipidemia   . Insomnia   . Type 2 diabetes mellitus ([place])     Past Surgical History:  Procedure Laterality Date  . CHOLECYSTECTOMY    . TUBAL LIGATION      Current  Medications: Current Meds  Medication Sig  . ALPRAZolam (XANAX) 0.5 MG tablet TAKE ONE TABLET 3 TIMES A DAY AS NEEDED.  Marland Kitchen amLODipine (NORVASC) 10 MG tablet TAKE ONE (1) TABLET EACH DAY  . aspirin EC 81 MG tablet Take 81 mg by mouth daily.  . cloNIDine (CATAPRES) 0.1 MG tablet Take 1 tablet (0.1 mg total) by mouth 3 (three) times daily. Take 1 extra tablet per day if blood pressure 180/110 is seen on home monitor.  . donepezil (ARICEPT) 10 MG tablet TAKE ONE TABLET DAILY AT BEDTIME  . DULoxetine (CYMBALTA) 60 MG capsule TAKE ONE (1) CAPSULE EACH DAY  . fenofibrate micronized (LOFIBRA) 134 MG capsule TAKE 1 CAPSULE EVERY MORNING BEFORE BREAKFAST  . fluorouracil (EFUDEX) 5 % cream Apply 1 application topically 2 (two) times daily as needed (Use on face).  Marland Kitchen HYDROcodone-acetaminophen (NORCO) 10-325 MG tablet Take 1 tablet by mouth every 8 (eight) hours as needed.  Marland Kitchen IFEREX 150 150 MG capsule TAKE ONE (1) CAPSULE EACH DAY  . JANUVIA 100 MG tablet TAKE ONE (1) TABLET EACH DAY  . levothyroxine (SYNTHROID, LEVOTHROID) 50 MCG tablet TAKE ONE TABLET EACH MORNING BEFORE BREAKFAST  . losartan (COZAAR) 100 MG tablet   . memantine (NAMENDA XR) 28 MG CP24 24 hr capsule TAKE ONE (1) CAPSULE EACH DAY  . metFORMIN (GLUCOPHAGE) 1000 MG tablet TAKE ONE TABLET TWICE A DAY WITH FOOD  . metoprolol succinate (  TOPROL-XL) 100 MG 24 hr tablet TAKE ONE (1) TABLET EACH DAY  . NIFEdipine (PROCARDIA XL/ADALAT-CC) 60 MG 24 hr tablet TAKE TWO TABLETS BY MOUTH DAILY  . NIFEdipine (PROCARDIA XL/ADALAT-CC) 90 MG 24 hr tablet TAKE ONE (1) TABLET EACH DAY  . nitroGLYCERIN (NITRODUR - DOSED IN MG/24 HR) 0.1 mg/hr patch APPLY 1 PATCH DAILY AS NEEDED  . OLANZapine (ZYPREXA) 5 MG tablet TAKE ONE TABLET DAILY AT BEDTIME  . pantoprazole (PROTONIX) 40 MG tablet TAKE ONE TABLET BY MOUTH TWICE DAILY  . potassium chloride (K-DUR) 10 MEQ tablet TAKE ONE TABLET BY MOUTH TWICE DAILY  . rosuvastatin (CRESTOR) 20 MG tablet TAKE ONE (1) TABLET  EACH DAY  . valsartan (DIOVAN) 320 MG tablet TAKE ONE (1) TABLET EACH DAY  . VENTOLIN HFA 108 (90 Base) MCG/ACT inhaler USE 2 PUFFS EVERY 6 HOURS AS NEEDED  . Vitamin D, Ergocalciferol, (DRISDOL) 50000 units CAPS capsule Take 1 capsule (50,000 Units total) by mouth 2 (two) times a week.  . zolpidem (AMBIEN) 10 MG tablet TAKE ONE TABLET AT BEDTIME AS NEEDED  . [DISCONTINUED] furosemide (LASIX) 20 MG tablet TAKE ONE (1) TABLET EACH DAY     Allergies:   Codeine and Amoxicillin   Social History   Social History  . Marital status: Single    Spouse name: N/A  . Number of children: 2  . Years of education: N/A   Occupational History  . Retired     Adult nurse   Social History Main Topics  . Smoking status: Never Smoker  . Smokeless tobacco: Never Used  . Alcohol use Yes     Comment: Rare  . Drug use: No  . Sexual activity: Not Asked   Other Topics Concern  . None   Social History Narrative  . None     Family History:  The patient's family history includes Allergies in her maternal grandmother and mother; Asthma in her mother; Breast cancer in her maternal grandmother; Heart disease in her maternal grandfather, maternal grandmother, mother, paternal grandfather, and paternal grandmother.   ROS:   Please see the history of present illness.    Review of Systems  Constitution: Positive for weight gain.  HENT: Negative.   Eyes: Negative.   Cardiovascular: Positive for chest pain and dyspnea on exertion.  Respiratory: Negative.   Hematologic/Lymphatic: Negative.   Musculoskeletal: Positive for muscle weakness. Negative for joint pain.  Gastrointestinal: Negative.   Genitourinary: Negative.   Neurological: Negative.    All other systems reviewed and are negative.   PHYSICAL EXAM:   VS:  BP (!) 160/88 (BP Location: Right Arm)   Pulse 82   Ht 5\' 4"  (1.626 m)   Wt 232 lb (105.2 kg)   SpO2 92%   BMI 39.82 kg/m   Physical Exam  GEN: Well nourished, well  developed, in no acute distress  HEENT: normal  Neck: no JVD, carotid bruits, or masses Cardiac:RRR; no murmurs, rubs, or gallops  Respiratory:  clear to auscultation bilaterally, normal work of breathing GI: soft, nontender, nondistended, + BS Ext: without cyanosis, clubbing, or edema, Good distal pulses bilaterally MS: no deformity or atrophy  Skin: warm and dry, no rash Neuro:  Alert and Oriented x 3, Strength and sensation are intact Psych: euthymic mood, full affect  Wt Readings from Last 3 Encounters:  05/11/17 232 lb (105.2 kg)  03/01/17 232 lb (105.2 kg)  01/28/17 238 lb 3.2 oz (108 kg)      Studies/Labs Reviewed:  EKG:  EKG is not ordered today.    Recent Labs: 06/28/2016: ALT 57 09/20/2016: Hemoglobin 13.0; Platelets 287 12/28/2016: BUN 19; Creatinine, Ser 1.22; Potassium 4.1; Sodium 137   Lipid Panel    Component Value Date/Time   CHOL 133 06/28/2016 0928   TRIG 183 (H) 06/28/2016 0928   HDL 52 06/28/2016 0928   CHOLHDL 2.6 06/28/2016 0928   LDLCALC 44 06/28/2016 0928    Additional studies/ records that were reviewed today include:   Lexiscan Cardiolite 10/31/2013: FINDINGS: Pharmacological stress   Baseline EKG showed normal sinus rhythm with left anterior fascicular block. After injection, heart rate increased from 62 beats per min up to 82 beats per min and blood pressure decreased from 164/62 down to 115/57. The test was terminated after injection was complete. The patient did not experience any chest pain. Post-injection EKG showed no ischemic changes.   Myocardial perfusion imaging   Raw images showed appropriate radiotracer uptake. There is a small partially reversible defect in the distal anteroseptal wall, this area is mildly hypokinetic. There is a mild intensity moderate-sized defect in the lateral wall that is reversible, this area has normal wall motion. Raw images suggest that prominent breast and soft tissue over this lateral area are  likely causing an artifact.   Gated imaging shows a end-diastolic volume of 962 mL, end systolic volume 39 mL, left ventricular ejection fraction 62%, t.i.d. 1.10.   IMPRESSION: 1.  Abnormal Lexiscan MPI, overall low risk study.   2.  Small anteroseptal defect with mild reversibility.   3.  Normal left ventricular systolic function.   Chest CTA 10/05/2016: IMPRESSION: Slight increase in size of the ascending thoracic aortic aneurysm to 4.7 cm from 4.4 cm. Ascending thoracic aortic aneurysm. Recommend semi-annual imaging followup by CTA or MRA and referral to cardiothoracic surgery if not already obtained. This recommendation follows 2010 ACCF/AHA/AATS/ACR/ASA/SCA/SCAI/SIR/STS/SVM Guidelines for the Diagnosis and Management of Patients With Thoracic Aortic Disease. Circulation. 2010; 121: E366-Q947   Stable left upper lobe nodule dating back to 2013 consistent with benign etiology.   CTA 1/23/18IMPRESSION: Slight increase in size of the ascending thoracic aortic aneurysm to 4.7 cm from 4.4 cm. Ascending thoracic aortic aneurysm. Recommend semi-annual imaging followup by CTA or MRA and referral to cardiothoracic surgery if not already obtained. This recommendation follows 2010 ACCF/AHA/AATS/ACR/ASA/SCA/SCAI/SIR/STS/SVM Guidelines for the Diagnosis and Management of Patients With Thoracic Aortic Disease. Circulation. 2010; 121: M546-T035   Stable left upper lobe nodule dating back to 2013 consistent with benign etiology.     Electronically Signed   By: Inez Catalina M.D.   On: 10/05/2016 12:32      ASSESSMENT:    1. Atherosclerosis of native coronary artery of native heart without angina pectoris   2. Ascending aortic aneurysm ([place])   3. Essential hypertension   4. Mixed hyperlipidemia   5. Morbid obesity ([place])      PLAN:  In order of problems listed above:  CAD with previous workup in [facility] details not clear but low risk Myoview 2015.Patient now  complaining of increased chest tightness when her blood pressure is elevated in the afternoon. We'll check Lexi scan Myoview.  Ascending aortic aneurysm 4.7 cm 09/2016 followed by Dr. [name]  Essential hypertension uncontrolled on multi-pharmacy maxed out medications. Patient did not stop her Lasix and start Aldactone back and April so we will do that today. Begin Aldactone 25 mg once daily. Repeat renal function in 2 weeks. Repeat office visit in 2 weeks. Reiterated importance of 2  g sodium diet and offered her dietitian. She is eating fast foods daily and drinking 7-8 sodas daily. Needs major lifestyle change.  Hyperlipidemia continue Crestor  Morbid obesity weight loss and exercise program essential to her overall health.    Medication Adjustments/Labs and Tests Ordered: Current medicines are reviewed at length with the patient today.  Concerns regarding medicines are outlined above.  Medication changes, Labs and Tests ordered today are listed in the Patient Instructions below. There are no Patient Instructions on file for this visit.   Sumner Boast, PA-C  05/11/2017 12:28 PM    [facility] [address], [place], [place]  [address] Phone: [phone]; Fax: [phone]

## 2017-05-11 ENCOUNTER — Ambulatory Visit (INDEPENDENT_AMBULATORY_CARE_PROVIDER_SITE_OTHER): Payer: Medicare HMO | Admitting: Physician Assistant

## 2017-05-11 ENCOUNTER — Encounter: Payer: Self-pay | Admitting: Physician Assistant

## 2017-05-11 ENCOUNTER — Encounter: Payer: Self-pay | Admitting: *Deleted

## 2017-05-11 VITALS — BP 160/88 | HR 82 | Ht 64.0 in | Wt 232.0 lb

## 2017-05-11 DIAGNOSIS — I2510 Atherosclerotic heart disease of native coronary artery without angina pectoris: Secondary | ICD-10-CM

## 2017-05-11 DIAGNOSIS — I10 Essential (primary) hypertension: Secondary | ICD-10-CM

## 2017-05-11 DIAGNOSIS — R079 Chest pain, unspecified: Secondary | ICD-10-CM | POA: Diagnosis not present

## 2017-05-11 DIAGNOSIS — E782 Mixed hyperlipidemia: Secondary | ICD-10-CM | POA: Diagnosis not present

## 2017-05-11 DIAGNOSIS — I7121 Aneurysm of the ascending aorta, without rupture: Secondary | ICD-10-CM

## 2017-05-11 DIAGNOSIS — I25119 Atherosclerotic heart disease of native coronary artery with unspecified angina pectoris: Secondary | ICD-10-CM

## 2017-05-11 DIAGNOSIS — I712 Thoracic aortic aneurysm, without rupture: Secondary | ICD-10-CM

## 2017-05-11 MED ORDER — SPIRONOLACTONE 25 MG PO TABS: 25.0000 mg | ORAL_TABLET | Freq: Every day | ORAL | 3 refills | Status: DC

## 2017-05-11 NOTE — Patient Instructions (Signed)
Medication Instructions:  Your physician has recommended you make the following change in your medication:  Stop Taking Lasix Start Taking Aldactone 25 mg Daily    Labwork: Your physician recommends that you return for lab work in: 2 weeks    Testing/Procedures: Your physician has requested that you have a lexiscan myoview. For further information please visit [email or web address]. Please follow instruction sheet, as given.    Follow-Up: Your physician recommends that you schedule a follow-up appointment in: 2 weeks with Ermalinda Barrios, PA-C  Your physician recommends that you schedule a follow-up appointment in: October with Dr. Domenic Polite.    Any Other Special Instructions Will Be Listed Below (If Applicable).     If you need a refill on your cardiac medications before your next appointment, please call your pharmacy.

## 2017-05-11 NOTE — Addendum Note (Signed)
Addended by: Levonne Hubert on: 05/11/2017 12:37 PM   Modules accepted: Orders

## 2017-05-19 ENCOUNTER — Encounter (HOSPITAL_BASED_OUTPATIENT_CLINIC_OR_DEPARTMENT_OTHER)
Admission: RE | Admit: 2017-05-19 | Discharge: 2017-05-19 | Disposition: A | Discharge status: Home / Self Care | Payer: Medicare HMO | Source: Ambulatory Visit | Attending: Physician Assistant | Admitting: Physician Assistant

## 2017-05-19 ENCOUNTER — Encounter (HOSPITAL_COMMUNITY): Payer: Self-pay

## 2017-05-19 ENCOUNTER — Encounter (HOSPITAL_COMMUNITY)
Admission: RE | Admit: 2017-05-19 | Discharge: 2017-05-19 | Disposition: A | Discharge status: Home / Self Care | Payer: Medicare HMO | Source: Ambulatory Visit | Attending: Physician Assistant | Admitting: Physician Assistant

## 2017-05-19 DIAGNOSIS — I2510 Atherosclerotic heart disease of native coronary artery without angina pectoris: Secondary | ICD-10-CM

## 2017-05-19 DIAGNOSIS — R079 Chest pain, unspecified: Secondary | ICD-10-CM | POA: Diagnosis not present

## 2017-05-19 LAB — NM MYOCAR MULTI W/SPECT W/WALL MOTION / EF
CHL CUP RESTING HR STRESS: 60 {beats}/min
CSEPPHR: 84 {beats}/min
LV dias vol: 78 mL (ref 46–106)
LVSYSVOL: 28 mL
NUC STRESS TID: 1.1
RATE: 0.25
SDS: 7
SRS: 3
SSS: 10

## 2017-05-19 MED ORDER — REGADENOSON 0.4 MG/5ML IV SOLN
INTRAVENOUS | Status: AC
  Administered 2017-05-19: 0.4 mg via INTRAVENOUS
  Filled 2017-05-19: qty 5

## 2017-05-19 MED ORDER — SODIUM CHLORIDE 0.9% FLUSH
INTRAVENOUS | Status: AC
  Administered 2017-05-19: 10 mL via INTRAVENOUS
  Filled 2017-05-19: qty 10

## 2017-05-19 MED ORDER — TECHNETIUM TC 99M TETROFOSMIN IV KIT
10.0000 | PACK | Freq: Once | INTRAVENOUS | Status: AC | PRN
  Administered 2017-05-19: 10.05 via INTRAVENOUS

## 2017-05-19 MED ORDER — TECHNETIUM TC 99M TETROFOSMIN IV KIT
30.0000 | PACK | Freq: Once | INTRAVENOUS | Status: AC | PRN
  Administered 2017-05-19: 27 via INTRAVENOUS

## 2017-05-20 ENCOUNTER — Telehealth: Payer: Self-pay | Admitting: *Deleted

## 2017-05-20 NOTE — Telephone Encounter (Signed)
Called patient with test results. No answer. Left message to call back.  

## 2017-05-20 NOTE — Telephone Encounter (Signed)
-----   Message from Imogene Burn, PA-C sent at 05/19/2017  2:52 PM EDT ----- Patient with mild ischemia on stress test in same area as last test but now has another area of mild ischemia. Ask her if she's still having chest pain and Please see if you can have Dr. Domenic Polite see as an add on next week to review.

## 2017-05-25 ENCOUNTER — Ambulatory Visit: Payer: Medicare HMO | Admitting: Physician Assistant

## 2017-05-31 NOTE — Progress Notes (Signed)
Cardiology Office Note  Date: 06/02/2017   ID: Kara Nunez, DOB 1951/02/06, MRN [id number]  PCP: Terald Sleeper, PA-C  Primary Cardiologist: Rozann Lesches, MD   Chief Complaint  Patient presents with  . Coronary Artery Disease    History of Present Illness: Kara Nunez is a 66 y.o. female seen recently by Ms. Vita Barley in August. She was referred for follow-up ischemic testing at that time in light of recurrent chest pain in the setting of uncontrolled hypertension. Lexiscan Myoview from September 6 is outlined below. Mild anteroseptal ischemia was noted as well as a region of predominantly mid to basal inferolateral scar plus ischemia. LVEF was in normal range.  She presents today stating that she has been feeling very well over the last few weeks. She has had no further angina symptoms. She has taken a look at the salt in her diet and cut back significantly. She also reports compliance with her medications.  We went over the results of her recent Myoview. We discussed options of continuing observation on medical therapy in the absence of recurring angina, versus proceeding to cardiac catheterization if symptoms escalate. For now she was comfortable with observation. She plans to continue her swimming at the [facility] which will be a good gauge for her symptoms overall.  Past Medical History:  Diagnosis Date  . Allergic rhinitis   . Ascending aortic aneurysm ([place])    Followed by Dr. [name]  . Chronic back pain   . Coronary atherosclerosis of native coronary artery    Prior evaluation with in [facility] - details not clear   . Degenerative disc disease   . Essential hypertension, benign   . Hyperlipidemia   . Insomnia   . Type 2 diabetes mellitus ([place])     Past Surgical History:  Procedure Laterality Date  . CHOLECYSTECTOMY    . TUBAL LIGATION      Current Outpatient Prescriptions  Medication Sig Dispense Refill  . ALPRAZolam (XANAX) 0.5 MG tablet TAKE ONE  TABLET 3 TIMES A DAY AS NEEDED. 270 tablet 1  . amLODipine (NORVASC) 10 MG tablet TAKE ONE (1) TABLET EACH DAY 30 tablet 4  . aspirin EC 81 MG tablet Take 81 mg by mouth daily.    . cloNIDine (CATAPRES) 0.1 MG tablet Take 1 tablet (0.1 mg total) by mouth 3 (three) times daily. Take 1 extra tablet per day if blood pressure 180/110 is seen on home monitor. 360 tablet 1  . donepezil (ARICEPT) 10 MG tablet TAKE ONE TABLET DAILY AT BEDTIME 90 tablet 0  . DULoxetine (CYMBALTA) 60 MG capsule TAKE ONE (1) CAPSULE EACH DAY 90 capsule 3  . fenofibrate micronized (LOFIBRA) 134 MG capsule TAKE 1 CAPSULE EVERY MORNING BEFORE BREAKFAST 30 capsule 3  . fluorouracil (EFUDEX) 5 % cream Apply 1 application topically 2 (two) times daily as needed (Use on face).    Marland Kitchen HYDROcodone-acetaminophen (NORCO) 10-325 MG tablet Take 1 tablet by mouth every 8 (eight) hours as needed. 90 tablet 0  . IFEREX 150 150 MG capsule TAKE ONE (1) CAPSULE EACH DAY 90 capsule 3  . JANUVIA 100 MG tablet TAKE ONE (1) TABLET EACH DAY 90 tablet 3  . levothyroxine (SYNTHROID, LEVOTHROID) 50 MCG tablet TAKE ONE TABLET EACH MORNING BEFORE BREAKFAST 90 tablet 3  . losartan (COZAAR) 100 MG tablet     . memantine (NAMENDA XR) 28 MG CP24 24 hr capsule TAKE ONE (1) CAPSULE EACH DAY 90 capsule 0  . metFORMIN (GLUCOPHAGE)  1000 MG tablet TAKE ONE TABLET TWICE A DAY WITH FOOD 180 tablet 0  . metoprolol succinate (TOPROL-XL) 100 MG 24 hr tablet TAKE ONE (1) TABLET EACH DAY 90 tablet 3  . NIFEdipine (PROCARDIA XL/ADALAT-CC) 60 MG 24 hr tablet TAKE TWO TABLETS BY MOUTH DAILY 180 tablet 0  . NIFEdipine (PROCARDIA XL/ADALAT-CC) 90 MG 24 hr tablet TAKE ONE (1) TABLET EACH DAY 90 tablet 0  . nitroGLYCERIN (NITRODUR - DOSED IN MG/24 HR) 0.1 mg/hr patch APPLY 1 PATCH DAILY AS NEEDED 30 patch 2  . OLANZapine (ZYPREXA) 5 MG tablet TAKE ONE TABLET DAILY AT BEDTIME 90 tablet 0  . pantoprazole (PROTONIX) 40 MG tablet TAKE ONE TABLET BY MOUTH TWICE DAILY 180 tablet 3    . potassium chloride (K-DUR) 10 MEQ tablet TAKE ONE TABLET BY MOUTH TWICE DAILY 180 tablet 0  . rosuvastatin (CRESTOR) 20 MG tablet TAKE ONE (1) TABLET EACH DAY 90 tablet 0  . spironolactone (ALDACTONE) 25 MG tablet Take 1 tablet (25 mg total) by mouth daily. 90 tablet 3  . valsartan (DIOVAN) 320 MG tablet TAKE ONE (1) TABLET EACH DAY 90 tablet 1  . VENTOLIN HFA 108 (90 Base) MCG/ACT inhaler USE 2 PUFFS EVERY 6 HOURS AS NEEDED 54 g 3  . Vitamin D, Ergocalciferol, (DRISDOL) 50000 units CAPS capsule Take 1 capsule (50,000 Units total) by mouth 2 (two) times a week. 24 capsule 2  . zolpidem (AMBIEN) 10 MG tablet TAKE ONE TABLET AT BEDTIME AS NEEDED 90 tablet 1   No current facility-administered medications for this visit.    Allergies:  Codeine and Amoxicillin   Social History: The patient  reports that she has never smoked. She has never used smokeless tobacco. She reports that she drinks alcohol. She reports that she does not use drugs.   ROS:  Please see the history of present illness. Otherwise, complete review of systems is positive for none.  All other systems are reviewed and negative.   Physical Exam: VS:  BP (!) 152/84   Pulse 66   Ht 5\' 4"  (1.626 m)   Wt 230 lb (104.3 kg)   SpO2 94%   BMI 39.48 kg/m , BMI Body mass index is 39.48 kg/m.  Wt Readings from Last 3 Encounters:  06/02/17 230 lb (104.3 kg)  06/01/17 232 lb 12.8 oz (105.6 kg)  05/11/17 232 lb (105.2 kg)    General: Overweight woman appears comfortable at rest. HEENT: Conjunctiva and lids normal, oropharynx clear with moist mucosa. Neck: Supple, no elevated JVP or carotid bruits, no thyromegaly. Lungs: Clear to auscultation, nonlabored breathing at rest. Cardiac: Regular rate and rhythm, no S3, soft systolic murmur, no pericardial rub. Abdomen: Soft, nontender, bowel sounds present, no guarding or rebound. Extremities: No pitting edema, distal pulses 2+. Skin: Warm and dry. Musculoskeletal: No  kyphosis. Neuropsychiatric: Alert and oriented x3, affect grossly appropriate.  ECG: I personally reviewed the tracing from 12/28/2016 which showed sinus bradycardia with left anterior fascicular block and increased voltage.  Recent Labwork: 06/01/2017: ALT 16; AST 17; BUN 21; Creatinine, Ser 1.38; Hemoglobin 12.4; Platelets 281; Potassium 4.9; Sodium 140     Component Value Date/Time   CHOL 148 06/01/2017 1456   TRIG 304 (H) 06/01/2017 1456   HDL 50 06/01/2017 1456   CHOLHDL 3.0 06/01/2017 1456   LDLCALC 37 06/01/2017 1456    Other Studies Reviewed Today:  [facility] Myoview 05/19/2017:  There was no ST segment deviation noted during stress.  Findings consistent with mild anterior ischemia  and prior small inferolateral myocardial infarction with mild peri-infarct ischemia.  The left ventricular ejection fraction is normal (55-65%).  Low to intermediate risk study. Two mild areas of ischemia as described above.  Chest CTA 10/05/2016: IMPRESSION: Slight increase in size of the ascending thoracic aortic aneurysm to 4.7 cm from 4.4 cm. Ascending thoracic aortic aneurysm. Recommend semi-annual imaging followup by CTA or MRA and referral to cardiothoracic surgery if not already obtained. This recommendation follows 2010 ACCF/AHA/AATS/ACR/ASA/SCA/SCAI/SIR/STS/SVM Guidelines for the Diagnosis and Management of Patients With Thoracic Aortic Disease. Circulation. 2010; 121: P947-M761  Stable left upper lobe nodule dating back to 2013 consistent with benign etiology.  Assessment and Plan:  1. History of CAD with previous workup in [place]. She has had some recent angina on the setting of uncontrolled hypertension, however symptoms are now absent. She states she feels better and has made some dietary changes. Recent Myoview study shows a small region of anterior ischemia as well as a prior small inferolateral infarct scar with mild peri-infarct ischemia. LVEF was normal range. We have  discussed the results, and for now in the absence of progressive angina will continue with medical therapy and her exercise plan. If symptoms worsen, cardiac catheterization can be discussed.  2. Ascending aortic aneurysm followed by Dr. [name]. CTA from January of this year demonstrated size of 4.7 cm.  3. Difficult to control hypertension on multimodal therapy. She has cut back salt in her diet and reports compliance with her medications.  4. Hyperlipidemia, on Crestor. She continues to follow with PCP.  Current medicines were reviewed with the patient today.  Disposition: Follow-up in 3 months, sooner if needed.  Signed, Satira Sark, MD, [facility] 06/02/2017 1:43 PM    [place] [facility] [address] [place], [place] [address] Phone: [phone]; Fax: [phone]

## 2017-06-01 ENCOUNTER — Encounter: Payer: Self-pay | Admitting: Physician Assistant

## 2017-06-01 ENCOUNTER — Ambulatory Visit (INDEPENDENT_AMBULATORY_CARE_PROVIDER_SITE_OTHER): Payer: Medicare HMO | Admitting: Physician Assistant

## 2017-06-01 VITALS — BP 149/96 | HR 66 | Temp 97.9°F | Ht 64.0 in | Wt 232.8 lb

## 2017-06-01 DIAGNOSIS — G301 Alzheimer's disease with late onset: Secondary | ICD-10-CM

## 2017-06-01 DIAGNOSIS — I10 Essential (primary) hypertension: Secondary | ICD-10-CM

## 2017-06-01 DIAGNOSIS — E1165 Type 2 diabetes mellitus with hyperglycemia: Secondary | ICD-10-CM | POA: Diagnosis not present

## 2017-06-01 DIAGNOSIS — F0280 Dementia in other diseases classified elsewhere without behavioral disturbance: Secondary | ICD-10-CM | POA: Diagnosis not present

## 2017-06-01 DIAGNOSIS — I2510 Atherosclerotic heart disease of native coronary artery without angina pectoris: Secondary | ICD-10-CM

## 2017-06-01 DIAGNOSIS — IMO0001 Reserved for inherently not codable concepts without codable children: Secondary | ICD-10-CM

## 2017-06-01 LAB — BAYER DCA HB A1C WAIVED: HB A1C: 7.5 % — AB (ref <7.0)

## 2017-06-01 NOTE — Progress Notes (Signed)
BP (!) 149/96   Pulse 66   Temp 97.9 F (36.6 C) (Oral)   Ht 5' 4"  (1.626 m)   Wt 232 lb 12.8 oz (105.6 kg)   BMI 39.96 kg/m    Subjective:    Patient ID: Kara Nunez, female    DOB: 03-Apr-1951, 66 y.o.   MRN: [id number]  HPI: Kara Nunez is a 66 y.o. female presenting on 06/01/2017 for Diabetes; Hypertension; Hyperlipidemia; and Hypothyroidism  This patient comes in for recheck on her conditions. She has been doing fairly well. Diabetes control, her hypertension is greatly improved. She is able to use less and less clonidine. She is keeping a good record. She has been followed by cardiology and nephrology. Her lipid medication is without any problems and we are due a lipid panel now. We are also due a thyroid check. She will have labs today and she did eat approximately 5 hours ago. She'll be going to cardiology soon. Her memory is deteriorated but she seems very clear today and is doing a very good job at keeping notes and documentation of what's going on. She has greatly reduce her salt. And she is down 10 pounds over the summer.  Relevant past medical, surgical, family and social history reviewed and updated as indicated. Allergies and medications reviewed and updated.  Past Medical History:  Diagnosis Date  . Allergic rhinitis   . Ascending aortic aneurysm ([place])    Followed by Dr. [name]  . Chronic back pain   . Coronary atherosclerosis of native coronary artery    Prior evaluation with in [facility] - details not clear   . Degenerative disc disease   . Essential hypertension, benign   . Hyperlipidemia   . Insomnia   . Type 2 diabetes mellitus ([place])     Past Surgical History:  Procedure Laterality Date  . CHOLECYSTECTOMY    . TUBAL LIGATION      Review of Systems  Constitutional: Negative.  Negative for activity change, fatigue and fever.  HENT: Negative.   Eyes: Negative.   Respiratory: Negative.  Negative for cough, shortness of breath and wheezing.     Cardiovascular: Negative.  Negative for chest pain, palpitations and leg swelling.  Gastrointestinal: Negative.  Negative for abdominal pain.  Endocrine: Negative.   Genitourinary: Negative.  Negative for dysuria.  Musculoskeletal: Positive for arthralgias and back pain.  Skin: Negative.   Neurological: Negative.     Allergies as of 06/01/2017      Reactions   Codeine Nausea Only   Amoxicillin Rash      Medication List       Accurate as of 06/01/17  4:46 PM. Always use your most recent med list.          ALPRAZolam 0.5 MG tablet Commonly known as:  XANAX TAKE ONE TABLET 3 TIMES A DAY AS NEEDED.   amLODipine 10 MG tablet Commonly known as:  NORVASC TAKE ONE (1) TABLET EACH DAY   aspirin EC 81 MG tablet Take 81 mg by mouth daily.   cloNIDine 0.1 MG tablet Commonly known as:  CATAPRES Take 1 tablet (0.1 mg total) by mouth 3 (three) times daily. Take 1 extra tablet per day if blood pressure 180/110 is seen on home monitor.   donepezil 10 MG tablet Commonly known as:  ARICEPT TAKE ONE TABLET DAILY AT BEDTIME   DULoxetine 60 MG capsule Commonly known as:  CYMBALTA TAKE ONE (1) CAPSULE EACH DAY   fenofibrate micronized 134 MG capsule  Commonly known as:  LOFIBRA TAKE 1 CAPSULE EVERY MORNING BEFORE BREAKFAST   fluorouracil 5 % cream Commonly known as:  EFUDEX Apply 1 application topically 2 (two) times daily as needed (Use on face).   HYDROcodone-acetaminophen 10-325 MG tablet Commonly known as:  NORCO Take 1 tablet by mouth every 8 (eight) hours as needed.   IFEREX 150 150 MG capsule Generic drug:  iron polysaccharides TAKE ONE (1) CAPSULE EACH DAY   JANUVIA 100 MG tablet Generic drug:  sitaGLIPtin TAKE ONE (1) TABLET EACH DAY   levothyroxine 50 MCG tablet Commonly known as:  SYNTHROID, LEVOTHROID TAKE ONE TABLET EACH MORNING BEFORE BREAKFAST   losartan 100 MG tablet Commonly known as:  COZAAR   memantine 28 MG Cp24 24 hr capsule Commonly known as:   NAMENDA XR TAKE ONE (1) CAPSULE EACH DAY   metFORMIN 1000 MG tablet Commonly known as:  GLUCOPHAGE TAKE ONE TABLET TWICE A DAY WITH FOOD   metoprolol succinate 100 MG 24 hr tablet Commonly known as:  TOPROL-XL TAKE ONE (1) TABLET EACH DAY   NIFEdipine 60 MG 24 hr tablet Commonly known as:  PROCARDIA XL/ADALAT-CC TAKE TWO TABLETS BY MOUTH DAILY   NIFEdipine 90 MG 24 hr tablet Commonly known as:  PROCARDIA XL/ADALAT-CC TAKE ONE (1) TABLET EACH DAY   nitroGLYCERIN 0.1 mg/hr patch Commonly known as:  NITRODUR - Dosed in mg/24 hr APPLY 1 PATCH DAILY AS NEEDED   OLANZapine 5 MG tablet Commonly known as:  ZYPREXA TAKE ONE TABLET DAILY AT BEDTIME   pantoprazole 40 MG tablet Commonly known as:  PROTONIX TAKE ONE TABLET BY MOUTH TWICE DAILY   potassium chloride 10 MEQ tablet Commonly known as:  K-DUR TAKE ONE TABLET BY MOUTH TWICE DAILY   rosuvastatin 20 MG tablet Commonly known as:  CRESTOR TAKE ONE (1) TABLET EACH DAY   spironolactone 25 MG tablet Commonly known as:  ALDACTONE Take 1 tablet (25 mg total) by mouth daily.   valsartan 320 MG tablet Commonly known as:  DIOVAN TAKE ONE (1) TABLET EACH DAY   VENTOLIN HFA 108 (90 Base) MCG/ACT inhaler Generic drug:  albuterol USE 2 PUFFS EVERY 6 HOURS AS NEEDED   Vitamin D (Ergocalciferol) 50000 units Caps capsule Commonly known as:  DRISDOL Take 1 capsule (50,000 Units total) by mouth 2 (two) times a week.   zolpidem 10 MG tablet Commonly known as:  AMBIEN TAKE ONE TABLET AT BEDTIME AS NEEDED            Discharge Care Instructions        Start     Ordered   06/01/17 0000  CBC with Differential/Platelet     06/01/17 1453   06/01/17 0000  CMP14+EGFR     06/01/17 1453   06/01/17 0000  Lipid panel     06/01/17 1453   06/01/17 0000  Bayer DCA Hb A1c Waived     06/01/17 1453         Objective:    BP (!) 149/96   Pulse 66   Temp 97.9 F (36.6 C) (Oral)   Ht 5' 4"  (1.626 m)   Wt 232 lb 12.8 oz (105.6  kg)   BMI 39.96 kg/m   Allergies  Allergen Reactions  . Codeine Nausea Only  . Amoxicillin Rash    Physical Exam  Constitutional: She is oriented to person, place, and time. She appears well-developed and well-nourished.  HENT:  Head: Normocephalic and atraumatic.  Eyes: Pupils are equal, round, and reactive  to light. Conjunctivae and EOM are normal.  Cardiovascular: Normal rate, regular rhythm and intact distal pulses.   Murmur heard.  Systolic murmur is present with a grade of 1/6  Pulmonary/Chest: Effort normal and breath sounds normal.  Abdominal: Soft. Bowel sounds are normal.  Neurological: She is alert and oriented to person, place, and time. She has normal reflexes.  Skin: Skin is warm and dry. No rash noted.  Psychiatric: She has a normal mood and affect. Her behavior is normal. Judgment and thought content normal.  Nursing note and vitals reviewed.   Results for orders placed or performed in visit on 06/01/17  Bayer DCA Hb A1c Waived  Result Value Ref Range   Bayer DCA Hb A1c Waived 7.5 (H) <7.0 %      Assessment & Plan:   1. Essential hypertension - CBC with Differential/Platelet - CMP14+EGFR - Lipid panel  2. Atherosclerosis of native coronary artery of native heart without angina pectoris - Lipid panel  3. Uncontrolled type 2 diabetes mellitus without complication, without long-term current use of insulin (HCC) - CBC with Differential/Platelet - CMP14+EGFR - Bayer DCA Hb A1c Waived  4. Late onset Alzheimer's disease without behavioral disturbance Ex-husband and his friend lives with her   Current Outpatient Prescriptions:  .  ALPRAZolam (XANAX) 0.5 MG tablet, TAKE ONE TABLET 3 TIMES A DAY AS NEEDED., Disp: 270 tablet, Rfl: 1 .  amLODipine (NORVASC) 10 MG tablet, TAKE ONE (1) TABLET EACH DAY, Disp: 30 tablet, Rfl: 4 .  aspirin EC 81 MG tablet, Take 81 mg by mouth daily., Disp: , Rfl:  .  cloNIDine (CATAPRES) 0.1 MG tablet, Take 1 tablet (0.1 mg total)  by mouth 3 (three) times daily. Take 1 extra tablet per day if blood pressure 180/110 is seen on home monitor., Disp: 360 tablet, Rfl: 1 .  donepezil (ARICEPT) 10 MG tablet, TAKE ONE TABLET DAILY AT BEDTIME, Disp: 90 tablet, Rfl: 0 .  DULoxetine (CYMBALTA) 60 MG capsule, TAKE ONE (1) CAPSULE EACH DAY, Disp: 90 capsule, Rfl: 3 .  fenofibrate micronized (LOFIBRA) 134 MG capsule, TAKE 1 CAPSULE EVERY MORNING BEFORE BREAKFAST, Disp: 30 capsule, Rfl: 3 .  fluorouracil (EFUDEX) 5 % cream, Apply 1 application topically 2 (two) times daily as needed (Use on face)., Disp: , Rfl:  .  HYDROcodone-acetaminophen (NORCO) 10-325 MG tablet, Take 1 tablet by mouth every 8 (eight) hours as needed., Disp: 90 tablet, Rfl: 0 .  IFEREX 150 150 MG capsule, TAKE ONE (1) CAPSULE EACH DAY, Disp: 90 capsule, Rfl: 3 .  JANUVIA 100 MG tablet, TAKE ONE (1) TABLET EACH DAY, Disp: 90 tablet, Rfl: 3 .  levothyroxine (SYNTHROID, LEVOTHROID) 50 MCG tablet, TAKE ONE TABLET EACH MORNING BEFORE BREAKFAST, Disp: 90 tablet, Rfl: 3 .  losartan (COZAAR) 100 MG tablet, , Disp: , Rfl:  .  memantine (NAMENDA XR) 28 MG CP24 24 hr capsule, TAKE ONE (1) CAPSULE EACH DAY, Disp: 90 capsule, Rfl: 0 .  metFORMIN (GLUCOPHAGE) 1000 MG tablet, TAKE ONE TABLET TWICE A DAY WITH FOOD, Disp: 180 tablet, Rfl: 0 .  metoprolol succinate (TOPROL-XL) 100 MG 24 hr tablet, TAKE ONE (1) TABLET EACH DAY, Disp: 90 tablet, Rfl: 3 .  NIFEdipine (PROCARDIA XL/ADALAT-CC) 60 MG 24 hr tablet, TAKE TWO TABLETS BY MOUTH DAILY, Disp: 180 tablet, Rfl: 0 .  NIFEdipine (PROCARDIA XL/ADALAT-CC) 90 MG 24 hr tablet, TAKE ONE (1) TABLET EACH DAY, Disp: 90 tablet, Rfl: 0 .  nitroGLYCERIN (NITRODUR - DOSED IN MG/24 HR) 0.1 mg/hr  patch, APPLY 1 PATCH DAILY AS NEEDED, Disp: 30 patch, Rfl: 2 .  OLANZapine (ZYPREXA) 5 MG tablet, TAKE ONE TABLET DAILY AT BEDTIME, Disp: 90 tablet, Rfl: 0 .  pantoprazole (PROTONIX) 40 MG tablet, TAKE ONE TABLET BY MOUTH TWICE DAILY, Disp: 180 tablet, Rfl:  3 .  potassium chloride (K-DUR) 10 MEQ tablet, TAKE ONE TABLET BY MOUTH TWICE DAILY, Disp: 180 tablet, Rfl: 0 .  rosuvastatin (CRESTOR) 20 MG tablet, TAKE ONE (1) TABLET EACH DAY, Disp: 90 tablet, Rfl: 0 .  spironolactone (ALDACTONE) 25 MG tablet, Take 1 tablet (25 mg total) by mouth daily., Disp: 90 tablet, Rfl: 3 .  valsartan (DIOVAN) 320 MG tablet, TAKE ONE (1) TABLET EACH DAY, Disp: 90 tablet, Rfl: 1 .  VENTOLIN HFA 108 (90 Base) MCG/ACT inhaler, USE 2 PUFFS EVERY 6 HOURS AS NEEDED, Disp: 54 g, Rfl: 3 .  Vitamin D, Ergocalciferol, (DRISDOL) 50000 units CAPS capsule, Take 1 capsule (50,000 Units total) by mouth 2 (two) times a week., Disp: 24 capsule, Rfl: 2 .  zolpidem (AMBIEN) 10 MG tablet, TAKE ONE TABLET AT BEDTIME AS NEEDED, Disp: 90 tablet, Rfl: 1 Continue all other maintenance medications as listed above.  Follow up plan: Return in about 2 months (around 08/01/2017) for recheck.  Educational handout given for [place] PA-C [place] [address]  [place], [place] [address] [phone]   06/01/2017, 4:46 PM

## 2017-06-01 NOTE — Patient Instructions (Signed)
In a few days you may receive a survey in the mail or online from [employer] regarding your visit with us today. Please take a moment to fill this out. Your feedback is very important to our whole office. It can help us better understand your needs as well as improve your experience and satisfaction. Thank you for taking your time to complete it. We care about you.  Floriene Jeschke, PA-C  

## 2017-06-02 ENCOUNTER — Encounter: Payer: Self-pay | Admitting: Cardiology

## 2017-06-02 ENCOUNTER — Ambulatory Visit (INDEPENDENT_AMBULATORY_CARE_PROVIDER_SITE_OTHER): Payer: Medicare HMO | Admitting: Cardiology

## 2017-06-02 VITALS — BP 152/84 | HR 66 | Ht 64.0 in | Wt 230.0 lb

## 2017-06-02 DIAGNOSIS — I10 Essential (primary) hypertension: Secondary | ICD-10-CM | POA: Diagnosis not present

## 2017-06-02 DIAGNOSIS — I25119 Atherosclerotic heart disease of native coronary artery with unspecified angina pectoris: Secondary | ICD-10-CM

## 2017-06-02 DIAGNOSIS — I712 Thoracic aortic aneurysm, without rupture: Secondary | ICD-10-CM

## 2017-06-02 DIAGNOSIS — E782 Mixed hyperlipidemia: Secondary | ICD-10-CM | POA: Diagnosis not present

## 2017-06-02 DIAGNOSIS — I7121 Aneurysm of the ascending aorta, without rupture: Secondary | ICD-10-CM

## 2017-06-02 LAB — CMP14+EGFR
A/G RATIO: 1.5 (ref 1.2–2.2)
ALT: 16 IU/L (ref 0–32)
AST: 17 IU/L (ref 0–40)
Albumin: 4 g/dL (ref 3.6–4.8)
Alkaline Phosphatase: 56 IU/L (ref 39–117)
BUN/Creatinine Ratio: 15 (ref 12–28)
BUN: 21 mg/dL (ref 8–27)
CALCIUM: 9.6 mg/dL (ref 8.7–10.3)
CHLORIDE: 104 mmol/L (ref 96–106)
CO2: 22 mmol/L (ref 20–29)
Creatinine, Ser: 1.38 mg/dL — ABNORMAL HIGH (ref 0.57–1.00)
GFR, EST AFRICAN AMERICAN: 45 mL/min/{1.73_m2} — AB (ref >59)
GFR, EST NON AFRICAN AMERICAN: 39 mL/min/{1.73_m2} — AB (ref >59)
GLOBULIN, TOTAL: 2.6 g/dL (ref 1.5–4.5)
Glucose: 154 mg/dL — ABNORMAL HIGH (ref 65–99)
POTASSIUM: 4.9 mmol/L (ref 3.5–5.2)
SODIUM: 140 mmol/L (ref 134–144)
Total Protein: 6.6 g/dL (ref 6.0–8.5)

## 2017-06-02 LAB — CBC WITH DIFFERENTIAL/PLATELET
BASOS: 0 %
Basophils Absolute: 0 10*3/uL (ref 0.0–0.2)
EOS (ABSOLUTE): 0.3 10*3/uL (ref 0.0–0.4)
EOS: 4 %
HEMATOCRIT: 39.4 % (ref 34.0–46.6)
Hemoglobin: 12.4 g/dL (ref 11.1–15.9)
IMMATURE GRANULOCYTES: 1 %
Immature Grans (Abs): 0 10*3/uL (ref 0.0–0.1)
LYMPHS ABS: 2.1 10*3/uL (ref 0.7–3.1)
Lymphs: 29 %
MCH: 28.1 pg (ref 26.6–33.0)
MCHC: 31.5 g/dL (ref 31.5–35.7)
MCV: 89 fL (ref 79–97)
MONOS ABS: 0.3 10*3/uL (ref 0.1–0.9)
Monocytes: 5 %
NEUTROS ABS: 4.3 10*3/uL (ref 1.4–7.0)
Neutrophils: 61 %
PLATELETS: 281 10*3/uL (ref 150–379)
RBC: 4.42 x10E6/uL (ref 3.77–5.28)
RDW: 14.9 % (ref 12.3–15.4)
WBC: 7.1 10*3/uL (ref 3.4–10.8)

## 2017-06-02 LAB — LIPID PANEL
CHOLESTEROL TOTAL: 148 mg/dL (ref 100–199)
Chol/HDL Ratio: 3 ratio (ref 0.0–4.4)
HDL: 50 mg/dL (ref >39)
LDL Calculated: 37 mg/dL (ref 0–99)
TRIGLYCERIDES: 304 mg/dL — AB (ref 0–149)
VLDL CHOLESTEROL CAL: 61 mg/dL — AB (ref 5–40)

## 2017-06-02 NOTE — Patient Instructions (Signed)
Medication Instructions:  Your physician recommends that you continue on your current medications as directed. Please refer to the Current Medication list given to you today.   Labwork: NONE  Testing/Procedures: NONE  Follow-Up: Your physician recommends that you schedule a follow-up appointment in: 3 Months with Dr. McDowell   Any Other Special Instructions Will Be Listed Below (If Applicable).     If you need a refill on your cardiac medications before your next appointment, please call your pharmacy. Thank you for choosing [facility]!    

## 2017-07-04 ENCOUNTER — Other Ambulatory Visit: Payer: Self-pay | Admitting: Physician Assistant

## 2017-07-04 DIAGNOSIS — G4733 Obstructive sleep apnea (adult) (pediatric): Secondary | ICD-10-CM | POA: Diagnosis not present

## 2017-07-04 DIAGNOSIS — I10 Essential (primary) hypertension: Secondary | ICD-10-CM

## 2017-07-04 DIAGNOSIS — M5136 Other intervertebral disc degeneration, lumbar region: Secondary | ICD-10-CM

## 2017-07-04 NOTE — Telephone Encounter (Signed)
Patient calls again to be sure she can get a refill on her pain medications.  She says it was discussed at her last visit with provider.

## 2017-07-04 NOTE — Telephone Encounter (Signed)
Patient aware rx ready to be picked up 

## 2017-07-12 ENCOUNTER — Ambulatory Visit: Payer: Medicare HMO | Admitting: Cardiology

## 2017-07-21 ENCOUNTER — Other Ambulatory Visit: Payer: Self-pay | Admitting: Physician Assistant

## 2017-07-21 DIAGNOSIS — F419 Anxiety disorder, unspecified: Secondary | ICD-10-CM

## 2017-07-21 DIAGNOSIS — R600 Localized edema: Secondary | ICD-10-CM

## 2017-07-21 DIAGNOSIS — I10 Essential (primary) hypertension: Secondary | ICD-10-CM

## 2017-07-22 NOTE — Telephone Encounter (Signed)
rx called into pharmacy

## 2017-08-08 DIAGNOSIS — Z79899 Other long term (current) drug therapy: Secondary | ICD-10-CM | POA: Diagnosis not present

## 2017-08-08 DIAGNOSIS — E559 Vitamin D deficiency, unspecified: Secondary | ICD-10-CM | POA: Diagnosis not present

## 2017-08-08 DIAGNOSIS — I10 Essential (primary) hypertension: Secondary | ICD-10-CM | POA: Diagnosis not present

## 2017-08-08 DIAGNOSIS — N183 Chronic kidney disease, stage 3 (moderate): Secondary | ICD-10-CM | POA: Diagnosis not present

## 2017-08-08 DIAGNOSIS — R809 Proteinuria, unspecified: Secondary | ICD-10-CM | POA: Diagnosis not present

## 2017-08-08 DIAGNOSIS — D509 Iron deficiency anemia, unspecified: Secondary | ICD-10-CM | POA: Diagnosis not present

## 2017-08-10 DIAGNOSIS — N183 Chronic kidney disease, stage 3 (moderate): Secondary | ICD-10-CM | POA: Diagnosis not present

## 2017-08-10 DIAGNOSIS — N179 Acute kidney failure, unspecified: Secondary | ICD-10-CM | POA: Diagnosis not present

## 2017-08-10 DIAGNOSIS — I10 Essential (primary) hypertension: Secondary | ICD-10-CM | POA: Diagnosis not present

## 2017-08-10 DIAGNOSIS — R809 Proteinuria, unspecified: Secondary | ICD-10-CM | POA: Diagnosis not present

## 2017-08-12 ENCOUNTER — Other Ambulatory Visit: Payer: Self-pay | Admitting: Physician Assistant

## 2017-08-12 ENCOUNTER — Other Ambulatory Visit: Payer: Medicare HMO

## 2017-08-12 ENCOUNTER — Ambulatory Visit: Payer: Medicare HMO | Admitting: Physician Assistant

## 2017-08-12 DIAGNOSIS — I10 Essential (primary) hypertension: Secondary | ICD-10-CM

## 2017-08-12 DIAGNOSIS — N182 Chronic kidney disease, stage 2 (mild): Secondary | ICD-10-CM | POA: Diagnosis not present

## 2017-08-12 DIAGNOSIS — M5136 Other intervertebral disc degeneration, lumbar region: Secondary | ICD-10-CM

## 2017-08-12 DIAGNOSIS — N189 Chronic kidney disease, unspecified: Secondary | ICD-10-CM | POA: Insufficient documentation

## 2017-08-12 DIAGNOSIS — E1165 Type 2 diabetes mellitus with hyperglycemia: Secondary | ICD-10-CM

## 2017-08-12 LAB — BAYER DCA HB A1C WAIVED: HB A1C (BAYER DCA - WAIVED): 6.8 % (ref <7.0)

## 2017-08-12 MED ORDER — HYDROCODONE-ACETAMINOPHEN 10-325 MG PO TABS: ORAL_TABLET | ORAL | 0 refills | Status: DC

## 2017-08-12 MED ORDER — AZITHROMYCIN 250 MG PO TABS: ORAL_TABLET | ORAL | 0 refills | Status: DC

## 2017-08-13 LAB — CBC WITH DIFFERENTIAL/PLATELET
Basophils Absolute: 0 10*3/uL (ref 0.0–0.2)
Basos: 1 %
EOS (ABSOLUTE): 0.3 10*3/uL (ref 0.0–0.4)
EOS: 4 %
HEMATOCRIT: 40.9 % (ref 34.0–46.6)
Hemoglobin: 13.4 g/dL (ref 11.1–15.9)
Immature Grans (Abs): 0 10*3/uL (ref 0.0–0.1)
Immature Granulocytes: 1 %
LYMPHS ABS: 1.8 10*3/uL (ref 0.7–3.1)
Lymphs: 24 %
MCH: 28.1 pg (ref 26.6–33.0)
MCHC: 32.8 g/dL (ref 31.5–35.7)
MCV: 86 fL (ref 79–97)
MONOS ABS: 0.4 10*3/uL (ref 0.1–0.9)
Monocytes: 5 %
Neutrophils Absolute: 5 10*3/uL (ref 1.4–7.0)
Neutrophils: 65 %
PLATELETS: 339 10*3/uL (ref 150–379)
RBC: 4.77 x10E6/uL (ref 3.77–5.28)
RDW: 15.1 % (ref 12.3–15.4)
WBC: 7.5 10*3/uL (ref 3.4–10.8)

## 2017-08-13 LAB — CMP14+EGFR
A/G RATIO: 1.7 (ref 1.2–2.2)
ALBUMIN: 4.3 g/dL (ref 3.6–4.8)
ALT: 32 IU/L (ref 0–32)
AST: 31 IU/L (ref 0–40)
Alkaline Phosphatase: 54 IU/L (ref 39–117)
BILIRUBIN TOTAL: 0.2 mg/dL (ref 0.0–1.2)
BUN / CREAT RATIO: 16 (ref 12–28)
BUN: 24 mg/dL (ref 8–27)
CO2: 25 mmol/L (ref 20–29)
CREATININE: 1.49 mg/dL — AB (ref 0.57–1.00)
Calcium: 10.1 mg/dL (ref 8.7–10.3)
Chloride: 103 mmol/L (ref 96–106)
GFR calc Af Amer: 41 mL/min/{1.73_m2} — ABNORMAL LOW (ref >59)
GFR calc non Af Amer: 36 mL/min/{1.73_m2} — ABNORMAL LOW (ref >59)
GLOBULIN, TOTAL: 2.5 g/dL (ref 1.5–4.5)
Glucose: 205 mg/dL — ABNORMAL HIGH (ref 65–99)
POTASSIUM: 4.4 mmol/L (ref 3.5–5.2)
SODIUM: 143 mmol/L (ref 134–144)
Total Protein: 6.8 g/dL (ref 6.0–8.5)

## 2017-08-13 LAB — LIPID PANEL
CHOL/HDL RATIO: 2.9 ratio (ref 0.0–4.4)
CHOLESTEROL TOTAL: 159 mg/dL (ref 100–199)
HDL: 54 mg/dL (ref >39)
LDL CALC: 54 mg/dL (ref 0–99)
TRIGLYCERIDES: 255 mg/dL — AB (ref 0–149)
VLDL CHOLESTEROL CAL: 51 mg/dL — AB (ref 5–40)

## 2017-08-13 LAB — MICROALBUMIN / CREATININE URINE RATIO
CREATININE, UR: 50.5 mg/dL
MICROALB/CREAT RATIO: 3209.9 mg/g{creat} — AB (ref 0.0–30.0)
MICROALBUM., U, RANDOM: 1621 ug/mL

## 2017-08-19 DIAGNOSIS — Z79899 Other long term (current) drug therapy: Secondary | ICD-10-CM | POA: Diagnosis not present

## 2017-08-19 DIAGNOSIS — N183 Chronic kidney disease, stage 3 (moderate): Secondary | ICD-10-CM | POA: Diagnosis not present

## 2017-08-19 DIAGNOSIS — R809 Proteinuria, unspecified: Secondary | ICD-10-CM | POA: Diagnosis not present

## 2017-08-26 DIAGNOSIS — I10 Essential (primary) hypertension: Secondary | ICD-10-CM | POA: Diagnosis not present

## 2017-08-26 DIAGNOSIS — N183 Chronic kidney disease, stage 3 (moderate): Secondary | ICD-10-CM | POA: Diagnosis not present

## 2017-08-26 DIAGNOSIS — R809 Proteinuria, unspecified: Secondary | ICD-10-CM | POA: Diagnosis not present

## 2017-08-26 DIAGNOSIS — N179 Acute kidney failure, unspecified: Secondary | ICD-10-CM | POA: Diagnosis not present

## 2017-08-31 NOTE — Progress Notes (Signed)
Cardiology Office Note  Date: 09/01/2017   ID: Zamani Crocker, DOB 12-05-1950, MRN [id number]  PCP: Terald Sleeper, PA-C  Primary Cardiologist: Rozann Lesches, MD   Chief Complaint  Patient presents with  . Coronary Artery Disease    History of Present Illness: Kara Nunez is a 66 y.o. female last seen in September. She presents for a routine follow-up visit. Since last encounter she does not report any progressive angina symptoms. Continues to struggle with keeping her blood pressure controlled and is on multimodal therapy.  Lexiscan Myoview in September revealed a mild region of anterior ischemia with small inferolateral infarct scar associated with mild peri-infarct ischemia, normal LVEF. She was continued on medical therapy at that time after discussion and with good symptom control.  I reviewed her current medications. She states that she has been compliant. These were most recently adjusted by her PCP.  I reviewed her most recent lab work from November. LDL is well controlled at 54.  Past Medical History:  Diagnosis Date  . Allergic rhinitis   . Ascending aortic aneurysm ([place])    Followed by Dr. [name]  . Chronic back pain   . Coronary atherosclerosis of native coronary artery    Prior evaluation with in [facility] - details not clear   . Degenerative disc disease   . Essential hypertension, benign   . Hyperlipidemia   . Insomnia   . Type 2 diabetes mellitus ([place])     Past Surgical History:  Procedure Laterality Date  . CHOLECYSTECTOMY    . TUBAL LIGATION      Current Outpatient Medications  Medication Sig Dispense Refill  . ALPRAZolam (XANAX) 0.5 MG tablet TAKE ONE TABLET 3 TIMES A DAY AS NEEDED. 270 tablet 1  . aspirin EC 81 MG tablet Take 81 mg by mouth daily.    . cloNIDine (CATAPRES) 0.1 MG tablet TAKE 1 TABLET 3 TIMES DAILY TAKE 1 EXTRA TABLET IF BLOOD PRESSURE IS 180/110 ON HOME MONITOR 360 tablet 3  . donepezil (ARICEPT) 10 MG tablet TAKE ONE  TABLET DAILY AT BEDTIME 90 tablet 0  . DULoxetine (CYMBALTA) 60 MG capsule TAKE ONE (1) CAPSULE EACH DAY 90 capsule 3  . fenofibrate micronized (LOFIBRA) 134 MG capsule TAKE 1 CAPSULE EVERY MORNING BEFORE BREAKFAST 30 capsule 3  . fluorouracil (EFUDEX) 5 % cream Apply 1 application topically 2 (two) times daily as needed (Use on face).    . furosemide (LASIX) 20 MG tablet TAKE ONE (1) TABLET EACH DAY 90 tablet 0  . HYDROcodone-acetaminophen (NORCO) 10-325 MG tablet TAKE ONE TABLET EVERY 8 HOURS AS NEEDED 90 tablet 0  . IFEREX 150 150 MG capsule TAKE ONE (1) CAPSULE EACH DAY 90 capsule 3  . JANUVIA 100 MG tablet TAKE ONE (1) TABLET EACH DAY 90 tablet 3  . levothyroxine (SYNTHROID, LEVOTHROID) 50 MCG tablet TAKE ONE TABLET EACH MORNING BEFORE BREAKFAST 90 tablet 3  . memantine (NAMENDA XR) 28 MG CP24 24 hr capsule TAKE ONE (1) CAPSULE EACH DAY 90 capsule 0  . metoprolol succinate (TOPROL-XL) 100 MG 24 hr tablet TAKE ONE (1) TABLET EACH DAY 90 tablet 3  . NIFEdipine (PROCARDIA XL/ADALAT-CC) 60 MG 24 hr tablet TAKE TWO TABLETS BY MOUTH DAILY 180 tablet 3  . NIFEdipine (PROCARDIA XL/ADALAT-CC) 90 MG 24 hr tablet TAKE ONE (1) TABLET EACH DAY 90 tablet 0  . nitroGLYCERIN (NITRODUR - DOSED IN MG/24 HR) 0.1 mg/hr patch APPLY 1 PATCH DAILY AS NEEDED 30 patch 2  .  OLANZapine (ZYPREXA) 5 MG tablet TAKE ONE TABLET DAILY AT BEDTIME 90 tablet 0  . pantoprazole (PROTONIX) 40 MG tablet TAKE ONE TABLET BY MOUTH TWICE DAILY 180 tablet 3  . potassium chloride (K-DUR) 10 MEQ tablet TAKE ONE TABLET BY MOUTH TWICE DAILY 180 tablet 0  . rosuvastatin (CRESTOR) 20 MG tablet TAKE ONE (1) TABLET EACH DAY 90 tablet 0  . valsartan (DIOVAN) 320 MG tablet TAKE ONE (1) TABLET EACH DAY 90 tablet 1  . VENTOLIN HFA 108 (90 Base) MCG/ACT inhaler USE 2 PUFFS EVERY 6 HOURS AS NEEDED 54 g 3  . Vitamin D, Ergocalciferol, (DRISDOL) 50000 units CAPS capsule TAKE 1 CAPSULE TWICE A WEEK 24 capsule 3  . zolpidem (AMBIEN) 10 MG tablet TAKE  ONE TABLET AT BEDTIME AS NEEDED 90 tablet 1  . spironolactone (ALDACTONE) 25 MG tablet Take 1 tablet (25 mg total) by mouth daily. 90 tablet 3   No current facility-administered medications for this visit.    Allergies:  Codeine and Amoxicillin   Social History: The patient  reports that  has never smoked. she has never used smokeless tobacco. She reports that she drinks alcohol. She reports that she does not use drugs.   ROS:  Please see the history of present illness. Otherwise, complete review of systems is positive for chronic back pain.  All other systems are reviewed and negative.   Physical Exam: VS:  BP (!) 170/85 (BP Location: Left Arm)   Pulse 73   Ht 5\' 3"  (1.6 m)   Wt 233 lb (105.7 kg)   SpO2 94%   BMI 41.27 kg/m , BMI Body mass index is 41.27 kg/m.  Wt Readings from Last 3 Encounters:  09/01/17 233 lb (105.7 kg)  06/02/17 230 lb (104.3 kg)  06/01/17 232 lb 12.8 oz (105.6 kg)    General: Obese woman, appears comfortable at rest. HEENT: Conjunctiva and lids normal, oropharynx clear. Neck: Supple, no elevated JVP or carotid bruits, no thyromegaly. Lungs: Clear to auscultation, nonlabored breathing at rest. Cardiac: Regular rate and rhythm, no S3, soft systolic murmur, no pericardial rub. Abdomen: Soft, nontender, bowel sounds present, no guarding or rebound. Extremities: No pitting edema, distal pulses 2+. Skin: Warm and dry. Musculoskeletal: No kyphosis. Neuropsychiatric: Alert and oriented x3, affect grossly appropriate.  ECG: I personally reviewed the tracing from 12/28/2016 which showed sinus bradycardia with left anterior fascicular block and increased voltage.  Recent Labwork: 08/12/2017: ALT 32; AST 31; BUN 24; Creatinine, Ser 1.49; Hemoglobin 13.4; Platelets 339; Potassium 4.4; Sodium 143     Component Value Date/Time   CHOL 159 08/12/2017 1042   TRIG 255 (H) 08/12/2017 1042   HDL 54 08/12/2017 1042   CHOLHDL 2.9 08/12/2017 1042   LDLCALC 54 08/12/2017  1042    Other Studies Reviewed Today:  Lexiscan Myoview 05/15/2017:  There was no ST segment deviation noted during stress.  Findings consistent with mild anterior ischemia and prior small inferolateral myocardial infarction with mild peri-infarct ischemia.  The left ventricular ejection fraction is normal (55-65%).  Low to intermediate risk study. Two mild areas of ischemia as described above.  Chest CTA 10/05/2016: IMPRESSION: Slight increase in size of the ascending thoracic aortic aneurysm to 4.7 cm from 4.4 cm. Ascending thoracic aortic aneurysm. Recommend semi-annual imaging followup by CTA or MRA and referral to cardiothoracic surgery if not already obtained. This recommendation follows 2010 ACCF/AHA/AATS/ACR/ASA/SCA/SCAI/SIR/STS/SVM Guidelines for the Diagnosis and Management of Patients With Thoracic Aortic Disease. Circulation. 2010; 121: G401-U272  Stable left  upper lobe nodule dating back to 2013 consistent with benign etiology.  Assessment and Plan:  1. History of CAD with previous evaluation when stent [place], details not clear. Recent Myoview from September indicated a small region of anterior ischemia as well as prior small inferolateral infarct scar with mild peri-infarct ischemia. We are continuing medical therapy in the absence of progressive angina symptoms. Therapy includes aspirin and statin.  2. Essential hypertension with difficult to control blood pressure. She continues on multimodal therapy and follows with PCP regularly. May need to consider increasing Aldactone further.  3. Ascending aortic aneurysm followed by Dr. [name]. She states that she has a follow-up chest CTA this coming January to compare to the prior study from earlier this year at which point size was 4.7 cm.  4. Hyperlipidemia, on Crestor with recent LDL 54.  Current medicines were reviewed with the patient today.  Disposition: Follow-up in 6 months.  Signed, Satira Sark,  MD, [facility] 09/01/2017 11:24 AM    [facility]. [address], [place], [place] [address] Phone: [phone]; Fax: [phone]

## 2017-09-01 ENCOUNTER — Encounter: Payer: Self-pay | Admitting: Cardiology

## 2017-09-01 ENCOUNTER — Ambulatory Visit: Payer: Medicare HMO | Admitting: Cardiology

## 2017-09-01 VITALS — BP 170/85 | HR 73 | Ht 63.0 in | Wt 233.0 lb

## 2017-09-01 DIAGNOSIS — I10 Essential (primary) hypertension: Secondary | ICD-10-CM | POA: Diagnosis not present

## 2017-09-01 DIAGNOSIS — I25119 Atherosclerotic heart disease of native coronary artery with unspecified angina pectoris: Secondary | ICD-10-CM | POA: Diagnosis not present

## 2017-09-01 DIAGNOSIS — E782 Mixed hyperlipidemia: Secondary | ICD-10-CM

## 2017-09-01 DIAGNOSIS — I712 Thoracic aortic aneurysm, without rupture: Secondary | ICD-10-CM

## 2017-09-01 DIAGNOSIS — I7121 Aneurysm of the ascending aorta, without rupture: Secondary | ICD-10-CM

## 2017-09-01 NOTE — Patient Instructions (Signed)
Your physician wants you to follow-up in:6 months with Dr.McDowell You will receive a reminder letter in the mail two months in advance. If you don't receive a letter, please call our office to schedule the follow-up appointment.   Your physician recommends that you continue on your current medications as directed. Please refer to the Current Medication list given to you today.   If you need a refill on your cardiac medications before your next appointment, please call your pharmacy.    No lab work or tests ordered today.      Thank you for choosing [facility] !         

## 2017-09-08 ENCOUNTER — Other Ambulatory Visit: Payer: Self-pay

## 2017-09-08 DIAGNOSIS — I712 Thoracic aortic aneurysm, without rupture, unspecified: Secondary | ICD-10-CM

## 2017-09-08 DIAGNOSIS — I7121 Aneurysm of the ascending aorta, without rupture: Secondary | ICD-10-CM

## 2017-09-16 DIAGNOSIS — Z79899 Other long term (current) drug therapy: Secondary | ICD-10-CM | POA: Diagnosis not present

## 2017-09-16 DIAGNOSIS — R809 Proteinuria, unspecified: Secondary | ICD-10-CM | POA: Diagnosis not present

## 2017-09-16 DIAGNOSIS — D649 Anemia, unspecified: Secondary | ICD-10-CM | POA: Diagnosis not present

## 2017-09-16 DIAGNOSIS — D509 Iron deficiency anemia, unspecified: Secondary | ICD-10-CM | POA: Diagnosis not present

## 2017-09-16 DIAGNOSIS — I10 Essential (primary) hypertension: Secondary | ICD-10-CM | POA: Diagnosis not present

## 2017-09-27 DIAGNOSIS — R809 Proteinuria, unspecified: Secondary | ICD-10-CM | POA: Diagnosis not present

## 2017-09-27 DIAGNOSIS — I10 Essential (primary) hypertension: Secondary | ICD-10-CM | POA: Diagnosis not present

## 2017-09-27 DIAGNOSIS — Z79899 Other long term (current) drug therapy: Secondary | ICD-10-CM | POA: Diagnosis not present

## 2017-09-27 DIAGNOSIS — D649 Anemia, unspecified: Secondary | ICD-10-CM | POA: Diagnosis not present

## 2017-09-27 DIAGNOSIS — N183 Chronic kidney disease, stage 3 (moderate): Secondary | ICD-10-CM | POA: Diagnosis not present

## 2017-10-04 ENCOUNTER — Ambulatory Visit (INDEPENDENT_AMBULATORY_CARE_PROVIDER_SITE_OTHER): Payer: Medicare HMO | Admitting: Physician Assistant

## 2017-10-04 ENCOUNTER — Ambulatory Visit: Payer: Medicare HMO | Admitting: Thoracic Surgery (Cardiothoracic Vascular Surgery)

## 2017-10-04 ENCOUNTER — Ambulatory Visit
Admission: RE | Admit: 2017-10-04 | Discharge: 2017-10-04 | Disposition: A | Discharge status: Home / Self Care | Payer: Medicare HMO | Source: Ambulatory Visit | Attending: Thoracic Surgery (Cardiothoracic Vascular Surgery) | Admitting: Thoracic Surgery (Cardiothoracic Vascular Surgery)

## 2017-10-04 ENCOUNTER — Other Ambulatory Visit: Payer: Self-pay

## 2017-10-04 ENCOUNTER — Encounter: Payer: Self-pay | Admitting: Physician Assistant

## 2017-10-04 ENCOUNTER — Encounter: Payer: Self-pay | Admitting: Thoracic Surgery (Cardiothoracic Vascular Surgery)

## 2017-10-04 VITALS — BP 181/85 | HR 64 | Resp 18 | Ht 63.0 in | Wt 236.6 lb

## 2017-10-04 DIAGNOSIS — M5136 Other intervertebral disc degeneration, lumbar region: Secondary | ICD-10-CM

## 2017-10-04 DIAGNOSIS — I10 Essential (primary) hypertension: Secondary | ICD-10-CM | POA: Diagnosis not present

## 2017-10-04 DIAGNOSIS — F419 Anxiety disorder, unspecified: Secondary | ICD-10-CM

## 2017-10-04 DIAGNOSIS — R600 Localized edema: Secondary | ICD-10-CM

## 2017-10-04 DIAGNOSIS — I7121 Aneurysm of the ascending aorta, without rupture: Secondary | ICD-10-CM

## 2017-10-04 DIAGNOSIS — I712 Thoracic aortic aneurysm, without rupture, unspecified: Secondary | ICD-10-CM

## 2017-10-04 MED ORDER — ZOLPIDEM TARTRATE 10 MG PO TABS: 10.0000 mg | ORAL_TABLET | Freq: Every evening | ORAL | 1 refills | Status: DC | PRN

## 2017-10-04 MED ORDER — FUROSEMIDE 20 MG PO TABS: ORAL_TABLET | ORAL | 3 refills | Status: DC

## 2017-10-04 MED ORDER — SITAGLIPTIN PHOSPHATE 100 MG PO TABS: ORAL_TABLET | ORAL | 3 refills | Status: DC

## 2017-10-04 MED ORDER — HYDROCODONE-ACETAMINOPHEN 10-325 MG PO TABS: ORAL_TABLET | ORAL | 0 refills | Status: DC

## 2017-10-04 MED ORDER — POTASSIUM CHLORIDE ER 10 MEQ PO TBCR: 10.0000 meq | EXTENDED_RELEASE_TABLET | Freq: Two times a day (BID) | ORAL | 1 refills | Status: DC

## 2017-10-04 MED ORDER — PANTOPRAZOLE SODIUM 40 MG PO TBEC: 40.0000 mg | DELAYED_RELEASE_TABLET | Freq: Two times a day (BID) | ORAL | 3 refills | Status: DC

## 2017-10-04 MED ORDER — VALSARTAN 320 MG PO TABS: ORAL_TABLET | ORAL | 1 refills | Status: DC

## 2017-10-04 MED ORDER — DULOXETINE HCL 60 MG PO CPEP: ORAL_CAPSULE | ORAL | 3 refills | Status: DC

## 2017-10-04 MED ORDER — ROSUVASTATIN CALCIUM 20 MG PO TABS: ORAL_TABLET | ORAL | 3 refills | Status: DC

## 2017-10-04 MED ORDER — ALPRAZOLAM 0.5 MG PO TABS: ORAL_TABLET | ORAL | 1 refills | Status: DC

## 2017-10-04 MED ORDER — FENOFIBRATE MICRONIZED 134 MG PO CAPS: ORAL_CAPSULE | ORAL | 3 refills | Status: DC

## 2017-10-04 MED ORDER — CLOBETASOL PROPIONATE 0.05 % EX CREA: 1.0000 "application " | TOPICAL_CREAM | Freq: Two times a day (BID) | CUTANEOUS | 2 refills | Status: AC

## 2017-10-04 MED ORDER — NIFEDIPINE ER OSMOTIC RELEASE 60 MG PO TB24: 120.0000 mg | ORAL_TABLET | Freq: Every day | ORAL | 3 refills | Status: DC

## 2017-10-04 MED ORDER — DONEPEZIL HCL 10 MG PO TABS: 10.0000 mg | ORAL_TABLET | Freq: Every day | ORAL | 3 refills | Status: DC

## 2017-10-04 MED ORDER — VITAMIN D (ERGOCALCIFEROL) 1.25 MG (50000 UNIT) PO CAPS: ORAL_CAPSULE | ORAL | 3 refills | Status: DC

## 2017-10-04 MED ORDER — METOPROLOL SUCCINATE ER 100 MG PO TB24
ORAL_TABLET | ORAL | 3 refills | Status: DC
Start: 2017-10-04 — End: 2018-02-27

## 2017-10-04 MED ORDER — IOPAMIDOL (ISOVUE-370) INJECTION 76%
50.0000 mL | Freq: Once | INTRAVENOUS | Status: AC | PRN
  Administered 2017-10-04: 50 mL via INTRAVENOUS

## 2017-10-04 MED ORDER — MEMANTINE HCL ER 28 MG PO CP24: ORAL_CAPSULE | ORAL | 3 refills | Status: DC

## 2017-10-04 MED ORDER — SPIRONOLACTONE 25 MG PO TABS: 25.0000 mg | ORAL_TABLET | Freq: Every day | ORAL | 3 refills | Status: DC

## 2017-10-04 MED ORDER — NIFEDIPINE ER OSMOTIC RELEASE 90 MG PO TB24: ORAL_TABLET | ORAL | 3 refills | Status: DC

## 2017-10-04 MED ORDER — LEVOTHYROXINE SODIUM 50 MCG PO TABS: ORAL_TABLET | ORAL | 3 refills | Status: DC

## 2017-10-04 MED ORDER — OLANZAPINE 5 MG PO TABS: 5.0000 mg | ORAL_TABLET | Freq: Every day | ORAL | 1 refills | Status: DC

## 2017-10-04 MED ORDER — ALBUTEROL SULFATE HFA 108 (90 BASE) MCG/ACT IN AERS: INHALATION_SPRAY | RESPIRATORY_TRACT | 3 refills | Status: DC

## 2017-10-04 MED ORDER — NITROGLYCERIN 0.1 MG/HR TD PT24: MEDICATED_PATCH | TRANSDERMAL | 11 refills | Status: DC

## 2017-10-04 MED ORDER — CLONIDINE HCL 0.2 MG PO TABS: 0.2000 mg | ORAL_TABLET | Freq: Three times a day (TID) | ORAL | 1 refills | Status: DC

## 2017-10-04 NOTE — Progress Notes (Signed)
[address]       [place] [address]             [phone]     HPI: Kara Nunez returns for a one year follow-up  Kara Nunez is a 67 year old woman with a past medical history significant for hypertension, morbid obesity, type 2 diabetes, stage III chronic kidney disease, coronary artery disease, poorly controlled type 2 diabetes, hyperlipidemia, asthma, and an ascending aneurysm.   She was first noted to have a 4.4 cm ascending aneurysm in 2012.  I been following her since that time.  The aneurysm has remained stable.  She feels better than she did a year ago.  She has cut salt and sugar out of her diet.  She is swimming 3 times a week.  She has been tracking her blood pressures and they have been elevated.  She saw Dr. [name] this morning and her blood pressure medications were adjusted.  One medication was doubled.   Past Medical History:  Diagnosis Date  . Allergic rhinitis   . Ascending aortic aneurysm ([place])    Followed by Dr. [name]  . Chronic back pain   . Coronary atherosclerosis of native coronary artery    Prior evaluation with in [facility] - details not clear   . Degenerative disc disease   . Essential hypertension, benign   . Hyperlipidemia   . Insomnia   . Type 2 diabetes mellitus ([place])     Current Outpatient Medications  Medication Sig Dispense Refill  . albuterol (VENTOLIN HFA) 108 (90 Base) MCG/ACT inhaler USE 2 PUFFS EVERY 6 HOURS AS NEEDED 54 g 3  . ALPRAZolam (XANAX) 0.5 MG tablet TAKE ONE TABLET 3 TIMES A DAY AS NEEDED. 270 tablet 1  . aspirin EC 81 MG tablet Take 81 mg by mouth daily.    . clobetasol cream (TEMOVATE) 9.83 % Apply 1 application topically 2 (two) times daily. 60 g 2  . cloNIDine (CATAPRES) 0.2 MG tablet Take 1 tablet (0.2 mg total) by mouth 3 (three) times daily. 270 tablet 1  . donepezil (ARICEPT) 10 MG tablet Take 1 tablet (10 mg total) by mouth at bedtime. 90 tablet 3  . DULoxetine (CYMBALTA) 60 MG  capsule TAKE ONE (1) CAPSULE EACH DAY 90 capsule 3  . fenofibrate micronized (LOFIBRA) 134 MG capsule TAKE 1 CAPSULE EVERY MORNING BEFORE BREAKFAST 90 capsule 3  . fluorouracil (EFUDEX) 5 % cream Apply 1 application topically 2 (two) times daily as needed (Use on face).    . furosemide (LASIX) 20 MG tablet TAKE ONE (1) TABLET EACH DAY 90 tablet 3  . HYDROcodone-acetaminophen (NORCO) 10-325 MG tablet TAKE ONE TABLET EVERY 8 HOURS AS NEEDED 90 tablet 0  . IFEREX 150 150 MG capsule TAKE ONE (1) CAPSULE EACH DAY 90 capsule 3  . levothyroxine (SYNTHROID, LEVOTHROID) 50 MCG tablet TAKE ONE TABLET EACH MORNING BEFORE BREAKFAST 90 tablet 3  . memantine (NAMENDA XR) 28 MG CP24 24 hr capsule TAKE ONE (1) CAPSULE EACH DAY 90 capsule 3  . metoprolol succinate (TOPROL-XL) 100 MG 24 hr tablet TAKE ONE (1) TABLET EACH DAY 90 tablet 3  . NIFEdipine (PROCARDIA XL/ADALAT-CC) 60 MG 24 hr tablet Take 2 tablets (120 mg total) by mouth daily. 180 tablet 3  . NIFEdipine (PROCARDIA XL/ADALAT-CC) 90 MG 24 hr tablet TAKE ONE (1) TABLET EACH DAY 90 tablet 3  . nitroGLYCERIN (NITRODUR - DOSED IN MG/24 HR) 0.1 mg/hr patch APPLY 1 PATCH  DAILY AS NEEDED 30 patch 11  . OLANZapine (ZYPREXA) 5 MG tablet Take 1 tablet (5 mg total) by mouth at bedtime. 90 tablet 1  . pantoprazole (PROTONIX) 40 MG tablet Take 1 tablet (40 mg total) by mouth 2 (two) times daily. 180 tablet 3  . potassium chloride (K-DUR) 10 MEQ tablet Take 1 tablet (10 mEq total) by mouth 2 (two) times daily. 180 tablet 1  . rosuvastatin (CRESTOR) 20 MG tablet TAKE ONE (1) TABLET EACH DAY 90 tablet 3  . sitaGLIPtin (JANUVIA) 100 MG tablet TAKE ONE (1) TABLET EACH DAY 90 tablet 3  . valsartan (DIOVAN) 320 MG tablet TAKE ONE (1) TABLET EACH DAY 90 tablet 1  . Vitamin D, Ergocalciferol, (DRISDOL) 50000 units CAPS capsule TAKE 1 CAPSULE TWICE A WEEK 24 capsule 3  . zolpidem (AMBIEN) 10 MG tablet Take 1 tablet (10 mg total) by mouth at bedtime as needed. 90 tablet 1   No  current facility-administered medications for this visit.     Physical Exam BP (!) 181/85 (BP Location: Right Arm, Patient Position: Sitting, Cuff Size: Large)   Pulse 64   Resp 18   Ht 5\' 3"  (1.6 m)   Wt 236 lb 9.6 oz (107.3 kg)   SpO2 93% Comment: RA  BMI 41.64 kg/m  67 year old woman in no acute distress Alert and oriented x3 with no focal deficits Lungs clear with equal breath sounds bilaterally No carotid bruits Cardiac regular rate and rhythm normal S1-S2, no murmur Trace peripheral edema  Diagnostic Tests: CT ANGIOGRAPHY CHEST WITH CONTRAST  TECHNIQUE: Multidetector CT imaging of the chest was performed using the standard protocol during bolus administration of intravenous contrast. Multiplanar CT image reconstructions and MIPs were obtained to evaluate the vascular anatomy. Laboratory values obtained show BUN 27; creatinine 1.6; GFR 33.  CONTRAST:  90mL ISOVUE-370 IOPAMIDOL (ISOVUE-370) INJECTION 76%  COMPARISON:  October 05, 2016  FINDINGS: Cardiovascular: Ascending thoracic aorta has a maximum diameter of 4.6 x 4.5 cm, not appreciably changed. Diameter at the sinus of Valsalva level is 3.2 cm, stable. Maximum diameter at the aortic arch is seen proximally measuring 3.3 cm, stable. There is tapering of the descending thoracic aorta without change. There is no appreciable thoracic aortic dissection. The visualized great vessels appear unremarkable. There is a small pericardial effusion posteriorly. The pericardium does not appear thickened. There are foci of coronary artery calcification. The contrast bolus is not targeted to assess for potential pulmonary embolus; no obvious major vessel pulmonary embolus evident.  Mediastinum/Nodes: Visualized thyroid appears unremarkable. There are scattered subcentimeter mediastinal lymph nodes. There is no adenopathy by size criteria in the thoracic region. No esophageal lesions are evident.  Lungs/Pleura: There  is a tiny calcified granuloma in the left lower lobe lateral segment. There is no lung edema or consolidation. No pleural effusion or pleural thickening evident.  Upper Abdomen: Visualized upper abdominal structures appear normal except for absence of gallbladder and foci of upper abdominal aortic atherosclerosis.  Musculoskeletal: There are no blastic or lytic bone lesions.  Review of the MIP images confirms the above findings.  IMPRESSION: 1. Stable ascending thoracic aortic aneurysm with maximum transverse diameter 4.6 x 4.5 cm. Ascending thoracic aortic aneurysm. Recommend semi-annual imaging followup by CTA or MRA and referral to cardiothoracic surgery if not already obtained. This recommendation follows 2010 ACCF/AHA/AATS/ACR/ASA/SCA/SCAI/SIR/STS/SVM Guidelines for the Diagnosis and Management of Patients With Thoracic Aortic Disease. Circulation. 2010; 121: B716-R678.  2.  Small pericardial effusion.  3. Foci of aortic atherosclerosis  and foci of coronary artery calcification noted.  4.  No lung edema or consolidation.  5.  No adenopathy.  6.  Gallbladder absent.  Aortic aneurysm NOS (ICD10-I71.9).  Aortic Atherosclerosis (ICD10-I70.0).   Electronically Signed   By: Lowella Grip III M.D.   On: 10/04/2017 15:11 I personally reviewed the CT images and concur with the findings noted above.  I measured the aneurysm at 4.4 cm unchanged from previous  Impression: Kara Nunez is a 67 year old woman with a 4.4-4.5 cm ascending aneurysm.  This is been stable over the past 7 years.  We have been following her on annual basis.  Radiology measured her aneurysm at 4.5 cm today which put her into the six-month follow-up group.  I do not think that is unreasonable.  We will plan to do an MR angiogram so was not to increase her radiation exposure.  Hypertension-still problematic.  She is on multiple medications.  Dr. [name] is managing that.  Glad to see that Mrs.  Nunez is taking an active role in monitoring at home.  Obesity-she has changed her diet which is fantastic.  She also is exercising on a regular basis which should help as well.  Plan: Follow-up with Dr. [name] regarding blood pressure.  I will see her back in 6 months with an MR angiogram of the chest  Melrose Nakayama, MD [facility] [phone]

## 2017-10-04 NOTE — Progress Notes (Signed)
BP (!) 149/81   Pulse 63   Temp (!) 96.8 F (36 C) (Oral)   Ht 5' 3"  (1.6 m)   Wt 236 lb 12.8 oz (107.4 kg)   BMI 41.95 kg/m    Subjective:    Patient ID: Kara Nunez, female    DOB: 1950-11-27, 67 y.o.   MRN: [id number]  HPI: Kara Nunez is a 67 y.o. female presenting on 10/04/2017 for Hypertension; Rash; and other (Needs statement that she is capable of making her own decision )  This patient comes in for periodic recheck on medications and conditions including hypertension, depression, DDD, anxiety.  She has been feeling well overall. She sees her specialists in coming weeks.  She has been trying to be active and exercise in the pool.  All medications are reviewed today. There are no reports of any problems with the medications. All of the medical conditions are reviewed and updated.  Lab work is reviewed and will be ordered as medically necessary. There are no new problems reported with today's visit.   Relevant past medical, surgical, family and social history reviewed and updated as indicated. Allergies and medications reviewed and updated.  Past Medical History:  Diagnosis Date  . Allergic rhinitis   . Ascending aortic aneurysm ([place])    Followed by Dr. [name]  . Chronic back pain   . Coronary atherosclerosis of native coronary artery    Prior evaluation with in [facility] - details not clear   . Degenerative disc disease   . Essential hypertension, benign   . Hyperlipidemia   . Insomnia   . Type 2 diabetes mellitus ([place])     Past Surgical History:  Procedure Laterality Date  . CHOLECYSTECTOMY    . TUBAL LIGATION      Review of Systems  Constitutional: Negative.  Negative for activity change, fatigue and fever.  HENT: Negative.   Eyes: Negative.   Respiratory: Negative.  Negative for cough.   Cardiovascular: Negative.  Negative for chest pain.  Gastrointestinal: Negative.  Negative for abdominal pain.  Endocrine: Negative.   Genitourinary:  Negative.  Negative for dysuria.  Musculoskeletal: Negative.   Skin: Negative.   Neurological: Negative.     Allergies as of 10/04/2017      Reactions   Codeine Nausea Only   Amoxicillin Rash      Medication List        Accurate as of 10/04/17  9:50 PM. Always use your most recent med list.          albuterol 108 (90 Base) MCG/ACT inhaler Commonly known as:  VENTOLIN HFA USE 2 PUFFS EVERY 6 HOURS AS NEEDED   ALPRAZolam 0.5 MG tablet Commonly known as:  XANAX TAKE ONE TABLET 3 TIMES A DAY AS NEEDED.   aspirin EC 81 MG tablet Take 81 mg by mouth daily.   clobetasol cream 0.05 % Commonly known as:  TEMOVATE Apply 1 application topically 2 (two) times daily.   cloNIDine 0.2 MG tablet Commonly known as:  CATAPRES Take 1 tablet (0.2 mg total) by mouth 3 (three) times daily.   donepezil 10 MG tablet Commonly known as:  ARICEPT Take 1 tablet (10 mg total) by mouth at bedtime.   DULoxetine 60 MG capsule Commonly known as:  CYMBALTA TAKE ONE (1) CAPSULE EACH DAY   fenofibrate micronized 134 MG capsule Commonly known as:  LOFIBRA TAKE 1 CAPSULE EVERY MORNING BEFORE BREAKFAST   fluorouracil 5 % cream Commonly known as:  EFUDEX Apply 1  application topically 2 (two) times daily as needed (Use on face).   furosemide 20 MG tablet Commonly known as:  LASIX TAKE ONE (1) TABLET EACH DAY   HYDROcodone-acetaminophen 10-325 MG tablet Commonly known as:  NORCO TAKE ONE TABLET EVERY 8 HOURS AS NEEDED   IFEREX 150 150 MG capsule Generic drug:  iron polysaccharides TAKE ONE (1) CAPSULE EACH DAY   levothyroxine 50 MCG tablet Commonly known as:  SYNTHROID, LEVOTHROID TAKE ONE TABLET EACH MORNING BEFORE BREAKFAST   memantine 28 MG Cp24 24 hr capsule Commonly known as:  NAMENDA XR TAKE ONE (1) CAPSULE EACH DAY   metoprolol succinate 100 MG 24 hr tablet Commonly known as:  TOPROL-XL TAKE ONE (1) TABLET EACH DAY   NIFEdipine 60 MG 24 hr tablet Commonly known as:   PROCARDIA XL/ADALAT-CC Take 2 tablets (120 mg total) by mouth daily.   NIFEdipine 90 MG 24 hr tablet Commonly known as:  PROCARDIA XL/ADALAT-CC TAKE ONE (1) TABLET EACH DAY   nitroGLYCERIN 0.1 mg/hr patch Commonly known as:  NITRODUR - Dosed in mg/24 hr APPLY 1 PATCH DAILY AS NEEDED   OLANZapine 5 MG tablet Commonly known as:  ZYPREXA Take 1 tablet (5 mg total) by mouth at bedtime.   pantoprazole 40 MG tablet Commonly known as:  PROTONIX Take 1 tablet (40 mg total) by mouth 2 (two) times daily.   potassium chloride 10 MEQ tablet Commonly known as:  K-DUR Take 1 tablet (10 mEq total) by mouth 2 (two) times daily.   rosuvastatin 20 MG tablet Commonly known as:  CRESTOR TAKE ONE (1) TABLET EACH DAY   sitaGLIPtin 100 MG tablet Commonly known as:  JANUVIA TAKE ONE (1) TABLET EACH DAY   valsartan 320 MG tablet Commonly known as:  DIOVAN TAKE ONE (1) TABLET EACH DAY   Vitamin D (Ergocalciferol) 50000 units Caps capsule Commonly known as:  DRISDOL TAKE 1 CAPSULE TWICE A WEEK   zolpidem 10 MG tablet Commonly known as:  AMBIEN Take 1 tablet (10 mg total) by mouth at bedtime as needed.          Objective:    BP (!) 149/81   Pulse 63   Temp (!) 96.8 F (36 C) (Oral)   Ht 5' 3"  (1.6 m)   Wt 236 lb 12.8 oz (107.4 kg)   BMI 41.95 kg/m   Allergies  Allergen Reactions  . Codeine Nausea Only  . Amoxicillin Rash    Physical Exam  Constitutional: She is oriented to person, place, and time. She appears well-developed and well-nourished.  HENT:  Head: Normocephalic and atraumatic.  Eyes: Conjunctivae and EOM are normal. Pupils are equal, round, and reactive to light.  Cardiovascular: Normal rate, regular rhythm, normal heart sounds and intact distal pulses.  Pulmonary/Chest: Effort normal and breath sounds normal.  Abdominal: Soft. Bowel sounds are normal.  Neurological: She is alert and oriented to person, place, and time. She has normal reflexes.  Skin: Skin is  warm and dry. No rash noted.  Psychiatric: She has a normal mood and affect. Her behavior is normal. Judgment and thought content normal.  Nursing note and vitals reviewed.   Results for orders placed or performed in visit on 08/12/17  Bayer DCA Hb A1c Waived  Result Value Ref Range   Bayer DCA Hb A1c Waived 6.8 <7.0 %  Lipid panel  Result Value Ref Range   Cholesterol, Total 159 100 - 199 mg/dL   Triglycerides 255 (H) 0 - 149 mg/dL  HDL 54 >39 mg/dL   VLDL Cholesterol Cal 51 (H) 5 - 40 mg/dL   LDL Calculated 54 0 - 99 mg/dL   Chol/HDL Ratio 2.9 0.0 - 4.4 ratio  CMP14+EGFR  Result Value Ref Range   Glucose 205 (H) 65 - 99 mg/dL   BUN 24 8 - 27 mg/dL   Creatinine, Ser 1.49 (H) 0.57 - 1.00 mg/dL   GFR calc non Af Amer 36 (L) >59 mL/min/1.73   GFR calc Af Amer 41 (L) >59 mL/min/1.73   BUN/Creatinine Ratio 16 12 - 28   Sodium 143 134 - 144 mmol/L   Potassium 4.4 3.5 - 5.2 mmol/L   Chloride 103 96 - 106 mmol/L   CO2 25 20 - 29 mmol/L   Calcium 10.1 8.7 - 10.3 mg/dL   Total Protein 6.8 6.0 - 8.5 g/dL   Albumin 4.3 3.6 - 4.8 g/dL   Globulin, Total 2.5 1.5 - 4.5 g/dL   Albumin/Globulin Ratio 1.7 1.2 - 2.2   Bilirubin Total 0.2 0.0 - 1.2 mg/dL   Alkaline Phosphatase 54 39 - 117 IU/L   AST 31 0 - 40 IU/L   ALT 32 0 - 32 IU/L  CBC with Differential/Platelet  Result Value Ref Range   WBC 7.5 3.4 - 10.8 x10E3/uL   RBC 4.77 3.77 - 5.28 x10E6/uL   Hemoglobin 13.4 11.1 - 15.9 g/dL   Hematocrit 40.9 34.0 - 46.6 %   MCV 86 79 - 97 fL   MCH 28.1 26.6 - 33.0 pg   MCHC 32.8 31.5 - 35.7 g/dL   RDW 15.1 12.3 - 15.4 %   Platelets 339 150 - 379 x10E3/uL   Neutrophils 65 Not Estab. %   Lymphs 24 Not Estab. %   Monocytes 5 Not Estab. %   Eos 4 Not Estab. %   Basos 1 Not Estab. %   Neutrophils Absolute 5.0 1.4 - 7.0 x10E3/uL   Lymphocytes Absolute 1.8 0.7 - 3.1 x10E3/uL   Monocytes Absolute 0.4 0.1 - 0.9 x10E3/uL   EOS (ABSOLUTE) 0.3 0.0 - 0.4 x10E3/uL   Basophils Absolute 0.0 0.0 -  0.2 x10E3/uL   Immature Granulocytes 1 Not Estab. %   Immature Grans (Abs) 0.0 0.0 - 0.1 x10E3/uL  Microalbumin / creatinine urine ratio  Result Value Ref Range   Creatinine, Urine 50.5 Not Estab. mg/dL   Albumin, Urine 1,621.0 Not Estab. ug/mL   Microalb/Creat Ratio 3,209.9 (H) 0.0 - 30.0 mg/g creat      Assessment & Plan:   1. Essential hypertension - cloNIDine (CATAPRES) 0.2 MG tablet; Take 1 tablet (0.2 mg total) by mouth 3 (three) times daily.  Dispense: 270 tablet; Refill: 1 - NIFEdipine (PROCARDIA XL/ADALAT-CC) 60 MG 24 hr tablet; Take 2 tablets (120 mg total) by mouth daily.  Dispense: 180 tablet; Refill: 3 - NIFEdipine (PROCARDIA XL/ADALAT-CC) 90 MG 24 hr tablet; TAKE ONE (1) TABLET EACH DAY  Dispense: 90 tablet; Refill: 3 - valsartan (DIOVAN) 320 MG tablet; TAKE ONE (1) TABLET EACH DAY  Dispense: 90 tablet; Refill: 1 - metoprolol succinate (TOPROL-XL) 100 MG 24 hr tablet; TAKE ONE (1) TABLET EACH DAY  Dispense: 90 tablet; Refill: 3 - furosemide (LASIX) 20 MG tablet; TAKE ONE (1) TABLET EACH DAY  Dispense: 90 tablet; Refill: 3  2. Essential hypertension, benign  3. Localized edema - furosemide (LASIX) 20 MG tablet; TAKE ONE (1) TABLET EACH DAY  Dispense: 90 tablet; Refill: 3  4. DDD (degenerative disc disease), lumbar - HYDROcodone-acetaminophen (NORCO) 10-325 MG  tablet; TAKE ONE TABLET EVERY 8 HOURS AS NEEDED  Dispense: 90 tablet; Refill: 0  5. Anxiety - ALPRAZolam (XANAX) 0.5 MG tablet; TAKE ONE TABLET 3 TIMES A DAY AS NEEDED.  Dispense: 270 tablet; Refill: 1    Current Outpatient Medications:  .  albuterol (VENTOLIN HFA) 108 (90 Base) MCG/ACT inhaler, USE 2 PUFFS EVERY 6 HOURS AS NEEDED, Disp: 54 g, Rfl: 3 .  ALPRAZolam (XANAX) 0.5 MG tablet, TAKE ONE TABLET 3 TIMES A DAY AS NEEDED., Disp: 270 tablet, Rfl: 1 .  aspirin EC 81 MG tablet, Take 81 mg by mouth daily., Disp: , Rfl:  .  clobetasol cream (TEMOVATE) 7.90 %, Apply 1 application topically 2 (two) times daily.,  Disp: 60 g, Rfl: 2 .  cloNIDine (CATAPRES) 0.2 MG tablet, Take 1 tablet (0.2 mg total) by mouth 3 (three) times daily., Disp: 270 tablet, Rfl: 1 .  donepezil (ARICEPT) 10 MG tablet, Take 1 tablet (10 mg total) by mouth at bedtime., Disp: 90 tablet, Rfl: 3 .  DULoxetine (CYMBALTA) 60 MG capsule, TAKE ONE (1) CAPSULE EACH DAY, Disp: 90 capsule, Rfl: 3 .  fenofibrate micronized (LOFIBRA) 134 MG capsule, TAKE 1 CAPSULE EVERY MORNING BEFORE BREAKFAST, Disp: 90 capsule, Rfl: 3 .  fluorouracil (EFUDEX) 5 % cream, Apply 1 application topically 2 (two) times daily as needed (Use on face)., Disp: , Rfl:  .  furosemide (LASIX) 20 MG tablet, TAKE ONE (1) TABLET EACH DAY, Disp: 90 tablet, Rfl: 3 .  HYDROcodone-acetaminophen (NORCO) 10-325 MG tablet, TAKE ONE TABLET EVERY 8 HOURS AS NEEDED, Disp: 90 tablet, Rfl: 0 .  IFEREX 150 150 MG capsule, TAKE ONE (1) CAPSULE EACH DAY, Disp: 90 capsule, Rfl: 3 .  levothyroxine (SYNTHROID, LEVOTHROID) 50 MCG tablet, TAKE ONE TABLET EACH MORNING BEFORE BREAKFAST, Disp: 90 tablet, Rfl: 3 .  memantine (NAMENDA XR) 28 MG CP24 24 hr capsule, TAKE ONE (1) CAPSULE EACH DAY, Disp: 90 capsule, Rfl: 3 .  metoprolol succinate (TOPROL-XL) 100 MG 24 hr tablet, TAKE ONE (1) TABLET EACH DAY, Disp: 90 tablet, Rfl: 3 .  NIFEdipine (PROCARDIA XL/ADALAT-CC) 60 MG 24 hr tablet, Take 2 tablets (120 mg total) by mouth daily., Disp: 180 tablet, Rfl: 3 .  NIFEdipine (PROCARDIA XL/ADALAT-CC) 90 MG 24 hr tablet, TAKE ONE (1) TABLET EACH DAY, Disp: 90 tablet, Rfl: 3 .  nitroGLYCERIN (NITRODUR - DOSED IN MG/24 HR) 0.1 mg/hr patch, APPLY 1 PATCH DAILY AS NEEDED, Disp: 30 patch, Rfl: 11 .  OLANZapine (ZYPREXA) 5 MG tablet, Take 1 tablet (5 mg total) by mouth at bedtime., Disp: 90 tablet, Rfl: 1 .  pantoprazole (PROTONIX) 40 MG tablet, Take 1 tablet (40 mg total) by mouth 2 (two) times daily., Disp: 180 tablet, Rfl: 3 .  potassium chloride (K-DUR) 10 MEQ tablet, Take 1 tablet (10 mEq total) by mouth 2  (two) times daily., Disp: 180 tablet, Rfl: 1 .  rosuvastatin (CRESTOR) 20 MG tablet, TAKE ONE (1) TABLET EACH DAY, Disp: 90 tablet, Rfl: 3 .  sitaGLIPtin (JANUVIA) 100 MG tablet, TAKE ONE (1) TABLET EACH DAY, Disp: 90 tablet, Rfl: 3 .  valsartan (DIOVAN) 320 MG tablet, TAKE ONE (1) TABLET EACH DAY, Disp: 90 tablet, Rfl: 1 .  Vitamin D, Ergocalciferol, (DRISDOL) 50000 units CAPS capsule, TAKE 1 CAPSULE TWICE A WEEK, Disp: 24 capsule, Rfl: 3 .  zolpidem (AMBIEN) 10 MG tablet, Take 1 tablet (10 mg total) by mouth at bedtime as needed., Disp: 90 tablet, Rfl: 1 Continue all other maintenance medications as  listed above.  Follow up plan: Return in about 3 months (around 01/02/2018) for recheck.  Educational handout given for [place] PA-C [place] [address]  [place],  [address] [phone]   10/04/2017, 9:50 PM

## 2017-10-04 NOTE — Patient Instructions (Signed)
In a few days you may receive a survey in the mail or online from [employer] regarding your visit with us today. Please take a moment to fill this out. Your feedback is very important to our whole office. It can help us better understand your needs as well as improve your experience and satisfaction. Thank you for taking your time to complete it. We care about you.  Edsel Shives, PA-C  

## 2017-10-05 DIAGNOSIS — N183 Chronic kidney disease, stage 3 (moderate): Secondary | ICD-10-CM | POA: Diagnosis not present

## 2017-10-05 DIAGNOSIS — R809 Proteinuria, unspecified: Secondary | ICD-10-CM | POA: Diagnosis not present

## 2017-10-14 DIAGNOSIS — L309 Dermatitis, unspecified: Secondary | ICD-10-CM | POA: Diagnosis not present

## 2017-10-14 DIAGNOSIS — L821 Other seborrheic keratosis: Secondary | ICD-10-CM | POA: Diagnosis not present

## 2017-10-14 DIAGNOSIS — Z1283 Encounter for screening for malignant neoplasm of skin: Secondary | ICD-10-CM | POA: Diagnosis not present

## 2017-10-14 DIAGNOSIS — L219 Seborrheic dermatitis, unspecified: Secondary | ICD-10-CM | POA: Diagnosis not present

## 2017-10-14 DIAGNOSIS — L570 Actinic keratosis: Secondary | ICD-10-CM | POA: Diagnosis not present

## 2017-10-17 DIAGNOSIS — G4733 Obstructive sleep apnea (adult) (pediatric): Secondary | ICD-10-CM | POA: Diagnosis not present

## 2017-10-21 ENCOUNTER — Other Ambulatory Visit (HOSPITAL_COMMUNITY): Payer: Self-pay | Admitting: Nephrology

## 2017-10-21 DIAGNOSIS — R809 Proteinuria, unspecified: Secondary | ICD-10-CM

## 2017-10-28 ENCOUNTER — Other Ambulatory Visit: Payer: Self-pay | Admitting: Radiology

## 2017-11-01 ENCOUNTER — Ambulatory Visit (HOSPITAL_COMMUNITY)
Admission: RE | Admit: 2017-11-01 | Discharge: 2017-11-01 | Disposition: A | Discharge status: Home / Self Care | Payer: Medicare HMO | Source: Ambulatory Visit | Attending: Nephrology | Admitting: Nephrology

## 2017-11-01 ENCOUNTER — Encounter (HOSPITAL_COMMUNITY): Payer: Self-pay

## 2017-11-01 DIAGNOSIS — Z7982 Long term (current) use of aspirin: Secondary | ICD-10-CM | POA: Insufficient documentation

## 2017-11-01 DIAGNOSIS — I719 Aortic aneurysm of unspecified site, without rupture: Secondary | ICD-10-CM | POA: Diagnosis not present

## 2017-11-01 DIAGNOSIS — Z885 Allergy status to narcotic agent status: Secondary | ICD-10-CM | POA: Diagnosis not present

## 2017-11-01 DIAGNOSIS — Z9851 Tubal ligation status: Secondary | ICD-10-CM | POA: Diagnosis not present

## 2017-11-01 DIAGNOSIS — R809 Proteinuria, unspecified: Secondary | ICD-10-CM | POA: Insufficient documentation

## 2017-11-01 DIAGNOSIS — N183 Chronic kidney disease, stage 3 (moderate): Secondary | ICD-10-CM | POA: Insufficient documentation

## 2017-11-01 DIAGNOSIS — Z803 Family history of malignant neoplasm of breast: Secondary | ICD-10-CM | POA: Insufficient documentation

## 2017-11-01 DIAGNOSIS — I2510 Atherosclerotic heart disease of native coronary artery without angina pectoris: Secondary | ICD-10-CM | POA: Diagnosis not present

## 2017-11-01 DIAGNOSIS — I712 Thoracic aortic aneurysm, without rupture: Secondary | ICD-10-CM | POA: Diagnosis not present

## 2017-11-01 DIAGNOSIS — G4700 Insomnia, unspecified: Secondary | ICD-10-CM | POA: Diagnosis not present

## 2017-11-01 DIAGNOSIS — I313 Pericardial effusion (noninflammatory): Secondary | ICD-10-CM | POA: Insufficient documentation

## 2017-11-01 DIAGNOSIS — I129 Hypertensive chronic kidney disease with stage 1 through stage 4 chronic kidney disease, or unspecified chronic kidney disease: Secondary | ICD-10-CM | POA: Insufficient documentation

## 2017-11-01 DIAGNOSIS — Z79891 Long term (current) use of opiate analgesic: Secondary | ICD-10-CM | POA: Diagnosis not present

## 2017-11-01 DIAGNOSIS — Z8249 Family history of ischemic heart disease and other diseases of the circulatory system: Secondary | ICD-10-CM | POA: Insufficient documentation

## 2017-11-01 DIAGNOSIS — I700 Atherosclerosis of aorta: Secondary | ICD-10-CM | POA: Insufficient documentation

## 2017-11-01 DIAGNOSIS — E1122 Type 2 diabetes mellitus with diabetic chronic kidney disease: Secondary | ICD-10-CM | POA: Diagnosis not present

## 2017-11-01 DIAGNOSIS — Z880 Allergy status to penicillin: Secondary | ICD-10-CM | POA: Diagnosis not present

## 2017-11-01 DIAGNOSIS — Z825 Family history of asthma and other chronic lower respiratory diseases: Secondary | ICD-10-CM | POA: Insufficient documentation

## 2017-11-01 DIAGNOSIS — Z9049 Acquired absence of other specified parts of digestive tract: Secondary | ICD-10-CM | POA: Diagnosis not present

## 2017-11-01 DIAGNOSIS — G8929 Other chronic pain: Secondary | ICD-10-CM | POA: Insufficient documentation

## 2017-11-01 DIAGNOSIS — E785 Hyperlipidemia, unspecified: Secondary | ICD-10-CM | POA: Insufficient documentation

## 2017-11-01 DIAGNOSIS — Z79899 Other long term (current) drug therapy: Secondary | ICD-10-CM | POA: Diagnosis not present

## 2017-11-01 LAB — CBC
HEMATOCRIT: 39.6 % (ref 36.0–46.0)
HEMOGLOBIN: 12.7 g/dL (ref 12.0–15.0)
MCH: 28 pg (ref 26.0–34.0)
MCHC: 32.1 g/dL (ref 30.0–36.0)
MCV: 87.2 fL (ref 78.0–100.0)
Platelets: 274 10*3/uL (ref 150–400)
RBC: 4.54 MIL/uL (ref 3.87–5.11)
RDW: 13.6 % (ref 11.5–15.5)
WBC: 6.3 10*3/uL (ref 4.0–10.5)

## 2017-11-01 LAB — APTT: APTT: 27 s (ref 24–36)

## 2017-11-01 LAB — PROTIME-INR
INR: 0.98
Prothrombin Time: 12.9 seconds (ref 11.4–15.2)

## 2017-11-01 LAB — GLUCOSE, CAPILLARY: Glucose-Capillary: 214 mg/dL — ABNORMAL HIGH (ref 65–99)

## 2017-11-01 MED ORDER — FENTANYL CITRATE (PF) 100 MCG/2ML IJ SOLN
INTRAMUSCULAR | Status: AC
  Filled 2017-11-01: qty 4

## 2017-11-01 MED ORDER — MIDAZOLAM HCL 2 MG/2ML IJ SOLN
INTRAMUSCULAR | Status: AC
  Filled 2017-11-01: qty 4

## 2017-11-01 MED ORDER — FENTANYL CITRATE (PF) 100 MCG/2ML IJ SOLN
INTRAMUSCULAR | Status: AC | PRN
  Administered 2017-11-01: 50 ug via INTRAVENOUS

## 2017-11-01 MED ORDER — GELATIN ABSORBABLE 12-7 MM EX MISC
CUTANEOUS | Status: AC
  Filled 2017-11-01: qty 1

## 2017-11-01 MED ORDER — MIDAZOLAM HCL 2 MG/2ML IJ SOLN
INTRAMUSCULAR | Status: AC | PRN
  Administered 2017-11-01: 1 mg via INTRAVENOUS

## 2017-11-01 MED ORDER — LIDOCAINE HCL (PF) 1 % IJ SOLN
INTRAMUSCULAR | Status: AC
  Filled 2017-11-01: qty 30

## 2017-11-01 MED ORDER — SODIUM CHLORIDE 0.9 % IV SOLN
INTRAVENOUS | Status: DC
  Administered 2017-11-01: 08:00:00 via INTRAVENOUS

## 2017-11-01 NOTE — Sedation Documentation (Signed)
Patient is resting comfortably. 

## 2017-11-01 NOTE — Procedures (Signed)
Interventional Radiology Procedure Note  Procedure: US guided random renal biopsy.   Complications: None immediate  Estimated Blood Loss: None  Recommendations: - Bedrest x 4 hrs - DC home if doing well clinically - Path is pending  Signed,  Criselda Peaches, MD

## 2017-11-01 NOTE — H&P (Signed)
Chief Complaint: Patient was seen in consultation today for random renal biopsy at the request of [place]  Referring Physician(s): Fran Lowes  Supervising Physician: Markus Daft  Patient Status: [facility] - Out-pt  History of Present Illness: Kara Nunez is a 67 y.o. female   Nephrotic range proteinuria HTN CKD 3 Biopsy 09/2016: Insufficient  Now scheduled for another biopsy today   Past Medical History:  Diagnosis Date  . Allergic rhinitis   . Ascending aortic aneurysm ([place])    Followed by Dr. [name]  . Chronic back pain   . Coronary atherosclerosis of native coronary artery    Prior evaluation with in [facility] - details not clear   . Degenerative disc disease   . Essential hypertension, benign   . Hyperlipidemia   . Insomnia   . Type 2 diabetes mellitus ([place])     Past Surgical History:  Procedure Laterality Date  . CHOLECYSTECTOMY    . TUBAL LIGATION      Allergies: Codeine and Amoxicillin  Medications: Prior to Admission medications   Medication Sig Start Date End Date Taking? Authorizing Provider  ALPRAZolam Duanne Moron) 0.5 MG tablet TAKE ONE TABLET 3 TIMES A DAY AS NEEDED. 10/04/17  Yes Terald Sleeper, PA-C  aspirin EC 81 MG tablet Take 81 mg by mouth daily.   Yes [provider]  clobetasol cream (TEMOVATE) 3.84 % Apply 1 application topically 2 (two) times daily. 10/04/17  Yes Terald Sleeper, PA-C  cloNIDine (CATAPRES) 0.2 MG tablet Take 1 tablet (0.2 mg total) by mouth 3 (three) times daily. 10/04/17  Yes Terald Sleeper, PA-C  donepezil (ARICEPT) 10 MG tablet Take 1 tablet (10 mg total) by mouth at bedtime. 10/04/17  Yes Terald Sleeper, PA-C  DULoxetine (CYMBALTA) 60 MG capsule TAKE ONE (1) CAPSULE EACH DAY 10/04/17  Yes Terald Sleeper, PA-C  fenofibrate micronized (LOFIBRA) 134 MG capsule TAKE 1 CAPSULE EVERY MORNING BEFORE BREAKFAST 10/04/17  Yes Terald Sleeper, PA-C  fluorouracil (EFUDEX) 5 % cream Apply 1 application  topically 2 (two) times daily as needed (Use on face).   Yes [provider]  furosemide (LASIX) 20 MG tablet TAKE ONE (1) TABLET EACH DAY 10/04/17  Yes Terald Sleeper, PA-C  HYDROcodone-acetaminophen (NORCO) 10-325 MG tablet TAKE ONE TABLET EVERY 8 HOURS AS NEEDED 10/04/17  Yes Terald Sleeper, PA-C  IFEREX 150 150 MG capsule TAKE ONE (1) CAPSULE EACH DAY 01/25/17  Yes Terald Sleeper, PA-C  levothyroxine (SYNTHROID, LEVOTHROID) 50 MCG tablet TAKE ONE TABLET EACH MORNING BEFORE BREAKFAST 10/04/17  Yes Terald Sleeper, PA-C  memantine (NAMENDA XR) 28 MG CP24 24 hr capsule TAKE ONE (1) CAPSULE EACH DAY 10/04/17  Yes Terald Sleeper, PA-C  metoprolol succinate (TOPROL-XL) 100 MG 24 hr tablet TAKE ONE (1) TABLET EACH DAY 10/04/17  Yes Terald Sleeper, PA-C  NIFEdipine (PROCARDIA XL/ADALAT-CC) 60 MG 24 hr tablet Take 2 tablets (120 mg total) by mouth daily. 10/04/17  Yes Terald Sleeper, PA-C  NIFEdipine (PROCARDIA XL/ADALAT-CC) 90 MG 24 hr tablet TAKE ONE (1) TABLET EACH DAY 10/04/17  Yes Terald Sleeper, PA-C  nitroGLYCERIN (NITRODUR - DOSED IN MG/24 HR) 0.1 mg/hr patch APPLY 1 PATCH DAILY AS NEEDED 10/04/17  Yes Terald Sleeper, PA-C  OLANZapine (ZYPREXA) 5 MG tablet Take 1 tablet (5 mg total) by mouth at bedtime. 10/04/17  Yes Terald Sleeper, PA-C  pantoprazole (PROTONIX) 40 MG tablet Take 1 tablet (40 mg total) by mouth 2 (  two) times daily. 10/04/17  Yes Terald Sleeper, PA-C  potassium chloride (K-DUR) 10 MEQ tablet Take 1 tablet (10 mEq total) by mouth 2 (two) times daily. 10/04/17  Yes Terald Sleeper, PA-C  rosuvastatin (CRESTOR) 20 MG tablet TAKE ONE (1) TABLET EACH DAY 10/04/17  Yes Terald Sleeper, PA-C  sitaGLIPtin (JANUVIA) 100 MG tablet TAKE ONE (1) TABLET EACH DAY 10/04/17  Yes Terald Sleeper, PA-C  valsartan (DIOVAN) 320 MG tablet TAKE ONE (1) TABLET EACH DAY 10/04/17  Yes Terald Sleeper, PA-C  Vitamin D, Ergocalciferol, (DRISDOL) 50000 units CAPS capsule TAKE 1 CAPSULE TWICE A WEEK 10/04/17  Yes Terald Sleeper, PA-C  zolpidem (AMBIEN) 10 MG tablet Take 1 tablet (10 mg total) by mouth at bedtime as needed. 10/04/17  Yes Terald Sleeper, PA-C  albuterol (VENTOLIN HFA) 108 (90 Base) MCG/ACT inhaler USE 2 PUFFS EVERY 6 HOURS AS NEEDED 10/04/17   Terald Sleeper, PA-C     Family History  Problem Relation Age of Onset  . Asthma Mother   . Allergies Mother   . Heart disease Mother   . Allergies Maternal Grandmother   . Heart disease Maternal Grandmother   . Breast cancer Maternal Grandmother   . Heart disease Maternal Grandfather   . Heart disease Paternal Grandfather   . Heart disease Paternal Grandmother     Social History   Socioeconomic History  . Marital status: Married    Spouse name: None  . Number of children: 2  . Years of education: None  . Highest education level: None  Social Needs  . Financial resource strain: None  . Food insecurity - worry: None  . Food insecurity - inability: None  . Transportation needs - medical: None  . Transportation needs - non-medical: None  Occupational History  . Occupation: Retired    Comment: Adult nurse  Tobacco Use  . Smoking status: Never Smoker  . Smokeless tobacco: Never Used  Substance and Sexual Activity  . Alcohol use: Yes    Comment: Rare  . Drug use: No  . Sexual activity: None  Other Topics Concern  . None  Social History Narrative  . None    Review of Systems: A 12 point ROS discussed and pertinent positives are indicated in the HPI above.  All other systems are negative.  Review of Systems  Constitutional: Positive for fatigue. Negative for activity change and fever.  Respiratory: Negative for cough and shortness of breath.   Cardiovascular: Negative for chest pain.  Gastrointestinal: Negative for nausea.  Musculoskeletal: Negative for gait problem.  Neurological: Negative for weakness.  Psychiatric/Behavioral: Negative for behavioral problems and confusion.    Vital Signs: BP 128/70   Pulse 62    Temp 98.1 F (36.7 C) (Oral)   Resp 16   Ht 5\' 4"  (1.626 m)   Wt 236 lb (107 kg)   SpO2 (!) 89%   BMI 40.51 kg/m   Physical Exam  Constitutional: She is oriented to person, place, and time.  Groggy-- Takes Ambien for sleep in pms  Cardiovascular: Normal rate, regular rhythm and normal heart sounds.  Pulmonary/Chest: Effort normal and breath sounds normal.  Abdominal: Soft. Bowel sounds are normal.  Musculoskeletal: Normal range of motion.  Neurological: She is alert and oriented to person, place, and time.  Skin: Skin is warm and dry.  Psychiatric: She has a normal mood and affect. Her behavior is normal. Judgment and thought content normal.  Nursing note  and vitals reviewed.   Imaging: Ct Angio Chest Aorta W/cm &/or Wo/cm  Result Date: 10/04/2017 CLINICAL DATA:  Thoracic aortic aneurysm EXAM: CT ANGIOGRAPHY CHEST WITH CONTRAST TECHNIQUE: Multidetector CT imaging of the chest was performed using the standard protocol during bolus administration of intravenous contrast. Multiplanar CT image reconstructions and MIPs were obtained to evaluate the vascular anatomy. Laboratory values obtained show BUN 27; creatinine 1.6; GFR 33. CONTRAST:  53mL ISOVUE-370 IOPAMIDOL (ISOVUE-370) INJECTION 76% COMPARISON:  October 05, 2016 FINDINGS: Cardiovascular: Ascending thoracic aorta has a maximum diameter of 4.6 x 4.5 cm, not appreciably changed. Diameter at the sinus of Valsalva level is 3.2 cm, stable. Maximum diameter at the aortic arch is seen proximally measuring 3.3 cm, stable. There is tapering of the descending thoracic aorta without change. There is no appreciable thoracic aortic dissection. The visualized great vessels appear unremarkable. There is a small pericardial effusion posteriorly. The pericardium does not appear thickened. There are foci of coronary artery calcification. The contrast bolus is not targeted to assess for potential pulmonary embolus; no obvious major vessel pulmonary embolus  evident. Mediastinum/Nodes: Visualized thyroid appears unremarkable. There are scattered subcentimeter mediastinal lymph nodes. There is no adenopathy by size criteria in the thoracic region. No esophageal lesions are evident. Lungs/Pleura: There is a tiny calcified granuloma in the left lower lobe lateral segment. There is no lung edema or consolidation. No pleural effusion or pleural thickening evident. Upper Abdomen: Visualized upper abdominal structures appear normal except for absence of gallbladder and foci of upper abdominal aortic atherosclerosis. Musculoskeletal: There are no blastic or lytic bone lesions. Review of the MIP images confirms the above findings. IMPRESSION: 1. Stable ascending thoracic aortic aneurysm with maximum transverse diameter 4.6 x 4.5 cm. Ascending thoracic aortic aneurysm. Recommend semi-annual imaging followup by CTA or MRA and referral to cardiothoracic surgery if not already obtained. This recommendation follows 2010 ACCF/AHA/AATS/ACR/ASA/SCA/SCAI/SIR/STS/SVM Guidelines for the Diagnosis and Management of Patients With Thoracic Aortic Disease. Circulation. 2010; 121: H829-H371. 2.  Small pericardial effusion. 3. Foci of aortic atherosclerosis and foci of coronary artery calcification noted. 4.  No lung edema or consolidation. 5.  No adenopathy. 6.  Gallbladder absent. Aortic aneurysm NOS (ICD10-I71.9). Aortic Atherosclerosis (ICD10-I70.0). Electronically Signed   By: Lowella Grip III M.D.   On: 10/04/2017 15:11    Labs:  CBC: Recent Labs    06/01/17 1456 08/12/17 1042 11/01/17 0600  WBC 7.1 7.5 6.3  HGB 12.4 13.4 12.7  HCT 39.4 40.9 39.6  PLT 281 339 274    COAGS: Recent Labs    11/01/17 0600  INR 0.98  APTT 27    BMP: Recent Labs    12/28/16 0930 06/01/17 1456 08/12/17 1042  NA 137 140 143  K 4.1 4.9 4.4  CL 104 104 103  CO2 25 22 25   GLUCOSE 191* 154* 205*  BUN 19 21 24   CALCIUM 9.6 9.6 10.1  CREATININE 1.22* 1.38* 1.49*  GFRNONAA 45*  39* 36*  GFRAA 52* 45* 41*    LIVER FUNCTION TESTS: Recent Labs    06/01/17 1456 08/12/17 1042  BILITOT <0.2 0.2  AST 17 31  ALT 16 32  ALKPHOS 56 54  PROT 6.6 6.8  ALBUMIN 4.0 4.3    TUMOR MARKERS: No results for input(s): AFPTM, CEA, CA199, CHROMGRNA in the last 8760 hours.  Assessment and Plan:  Chronic kidney disease stage 3 Nephrotic range proteinuria Previous renal bx 1 yr ago- insufficient Scheduled for new random renal biopsy today Risks and benefits  discussed with the patient including, but not limited to bleeding, infection, damage to adjacent structures or low yield requiring additional tests.  All of the patient's questions were answered, patient is agreeable to proceed. Consent signed and in chart.   Thank you for this interesting consult.  I greatly enjoyed meeting Kara Nunez and look forward to participating in their care.  A copy of this report was sent to the requesting provider on this date.  Electronically Signed: Lavonia Drafts, PA-C 11/01/2017, 7:25 AM   I spent a total of  30 Minutes   in face to face in clinical consultation, greater than 50% of which was counseling/coordinating care for random renal biospy

## 2017-11-01 NOTE — Discharge Instructions (Addendum)

## 2017-11-04 ENCOUNTER — Telehealth: Payer: Self-pay | Admitting: Physician Assistant

## 2017-11-04 DIAGNOSIS — M5136 Other intervertebral disc degeneration, lumbar region: Secondary | ICD-10-CM

## 2017-11-04 MED ORDER — HYDROCODONE-ACETAMINOPHEN 10-325 MG PO TABS
ORAL_TABLET | ORAL | 0 refills | Status: DC
Start: 2017-11-04 — End: 2017-12-29

## 2017-11-04 NOTE — Telephone Encounter (Signed)
Aware that she should have refills of xanax and ambien at pharmacy.  Hydrocodone sent to pharmacy

## 2017-11-04 NOTE — Telephone Encounter (Signed)
The alprazolam was filled with a 90-day supply January 22.  The Ambien was filled with a 90-day supply January 22.  The hydrocodone was only filled for 30-day supply.  A prescription has been sent to the pharmacy for the hydrocodone.  She should have adequate of the other 2 medications.

## 2017-11-11 DIAGNOSIS — R809 Proteinuria, unspecified: Secondary | ICD-10-CM | POA: Diagnosis not present

## 2017-11-11 DIAGNOSIS — N183 Chronic kidney disease, stage 3 (moderate): Secondary | ICD-10-CM | POA: Diagnosis not present

## 2017-11-11 DIAGNOSIS — Z79899 Other long term (current) drug therapy: Secondary | ICD-10-CM | POA: Diagnosis not present

## 2017-11-11 DIAGNOSIS — I10 Essential (primary) hypertension: Secondary | ICD-10-CM | POA: Diagnosis not present

## 2017-11-14 ENCOUNTER — Encounter (HOSPITAL_COMMUNITY): Payer: Self-pay

## 2017-11-23 DIAGNOSIS — N184 Chronic kidney disease, stage 4 (severe): Secondary | ICD-10-CM | POA: Diagnosis not present

## 2017-11-23 DIAGNOSIS — R801 Persistent proteinuria, unspecified: Secondary | ICD-10-CM | POA: Diagnosis not present

## 2017-11-29 ENCOUNTER — Ambulatory Visit: Payer: Medicare HMO | Admitting: Physician Assistant

## 2017-12-01 ENCOUNTER — Other Ambulatory Visit: Payer: Self-pay | Admitting: Physician Assistant

## 2017-12-01 DIAGNOSIS — M5136 Other intervertebral disc degeneration, lumbar region: Secondary | ICD-10-CM

## 2017-12-29 ENCOUNTER — Other Ambulatory Visit: Payer: Self-pay | Admitting: Physician Assistant

## 2017-12-29 ENCOUNTER — Ambulatory Visit (INDEPENDENT_AMBULATORY_CARE_PROVIDER_SITE_OTHER): Payer: Medicare HMO | Admitting: Physician Assistant

## 2017-12-29 DIAGNOSIS — J40 Bronchitis, not specified as acute or chronic: Secondary | ICD-10-CM

## 2017-12-29 DIAGNOSIS — M5136 Other intervertebral disc degeneration, lumbar region: Secondary | ICD-10-CM

## 2017-12-29 DIAGNOSIS — E1165 Type 2 diabetes mellitus with hyperglycemia: Secondary | ICD-10-CM | POA: Diagnosis not present

## 2017-12-29 DIAGNOSIS — I10 Essential (primary) hypertension: Secondary | ICD-10-CM

## 2017-12-29 LAB — BAYER DCA HB A1C WAIVED: HB A1C (BAYER DCA - WAIVED): 9.1 % — ABNORMAL HIGH (ref <7.0)

## 2017-12-29 MED ORDER — HYDROCODONE-HOMATROPINE 5-1.5 MG/5ML PO SYRP: 5.0000 mL | ORAL_SOLUTION | Freq: Four times a day (QID) | ORAL | 0 refills | Status: DC | PRN

## 2017-12-29 MED ORDER — HYDROCODONE-ACETAMINOPHEN 10-325 MG PO TABS: ORAL_TABLET | ORAL | 0 refills | Status: DC

## 2017-12-29 MED ORDER — ALBUTEROL SULFATE HFA 108 (90 BASE) MCG/ACT IN AERS: INHALATION_SPRAY | RESPIRATORY_TRACT | 3 refills | Status: DC

## 2017-12-29 MED ORDER — HYDROCODONE-ACETAMINOPHEN 10-325 MG PO TABS: 1.0000 | ORAL_TABLET | Freq: Three times a day (TID) | ORAL | 0 refills | Status: DC | PRN

## 2017-12-29 MED ORDER — METHYLPREDNISOLONE ACETATE 80 MG/ML IJ SUSP
80.0000 mg | Freq: Once | INTRAMUSCULAR | Status: AC
  Administered 2017-12-29: 80 mg via INTRAMUSCULAR

## 2017-12-29 MED ORDER — LEVOFLOXACIN 500 MG PO TABS: 500.0000 mg | ORAL_TABLET | Freq: Every day | ORAL | 0 refills | Status: DC
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
## 2018-01-04 ENCOUNTER — Ambulatory Visit: Payer: Medicare HMO | Admitting: Family Medicine

## 2018-01-04 ENCOUNTER — Ambulatory Visit (INDEPENDENT_AMBULATORY_CARE_PROVIDER_SITE_OTHER): Payer: Medicare HMO | Admitting: Family Medicine

## 2018-01-04 ENCOUNTER — Encounter: Payer: Self-pay | Admitting: Family Medicine

## 2018-01-04 ENCOUNTER — Encounter: Payer: Self-pay | Admitting: Physician Assistant

## 2018-01-04 ENCOUNTER — Ambulatory Visit (INDEPENDENT_AMBULATORY_CARE_PROVIDER_SITE_OTHER): Payer: Medicare HMO

## 2018-01-04 VITALS — BP 154/72 | HR 56 | Temp 97.8°F | Ht 64.0 in | Wt 224.1 lb

## 2018-01-04 DIAGNOSIS — R059 Cough, unspecified: Secondary | ICD-10-CM

## 2018-01-04 DIAGNOSIS — R05 Cough: Secondary | ICD-10-CM

## 2018-01-04 MED ORDER — AZITHROMYCIN 250 MG PO TABS: ORAL_TABLET | ORAL | 0 refills | Status: DC

## 2018-01-04 MED ORDER — PREDNISONE 10 MG PO TABS: ORAL_TABLET | ORAL | 0 refills | Status: DC

## 2018-01-04 NOTE — Progress Notes (Signed)
Chief Complaint  Patient presents with  . Cough    pt here today c/o cough that isn't getting any better since her visit with Glenard Haring on 4/18    HPI  Patient presents today for Patient presents with upper respiratory congestion.Patient reports coughing frequently still.  Green sputum noted. There is no fever, chills or sweats. The patient denies being short of breath. Onset was 3-5 days ago. Gradually worsening. Tried OTCs without improvement.  PMH: Smoking status noted ROS: Per HPI  Objective: BP (!) 154/72   Pulse (!) 56   Temp 97.8 F (36.6 C) (Oral)   Ht 5\' 4"  (1.626 m)   Wt 224 lb 2 oz (101.7 kg)   BMI 38.47 kg/m  Gen: NAD, alert, cooperative with exam HEENT: NCAT, Nasal passages swollen, red TMS RED CV: RRR, good S1/S2, no murmur Resp: Bronchitis changes with scattered wheezes, non-labored Ext: No edema, warm Neuro: Alert and oriented, No gross deficits Chest x-ray is negative for infiltrate today Assessment and plan:  1. Cough     Meds ordered this encounter  Medications  . azithromycin (ZITHROMAX) 250 MG tablet    Sig: Take as directed    Dispense:  6 tablet    Refill:  0  . predniSONE (DELTASONE) 10 MG tablet    Sig: Take 5 PO x 3 days, then 4 PO x 3 days, then 3 PO x 3 days, then 2 PO x 3 days then 1 PO x 3 days.    Dispense:  45 tablet    Refill:  0    Orders Placed This Encounter  Procedures  . DG Chest 2 View    Standing Status:   Future    Number of Occurrences:   1    Standing Expiration Date:   03/06/2019    Order Specific Question:   Reason for Exam (SYMPTOM  OR DIAGNOSIS REQUIRED)    Answer:   cough    Order Specific Question:   Preferred imaging location?    Answer:   Internal    Follow up as needed.  Claretta Fraise, MD

## 2018-01-06 ENCOUNTER — Telehealth: Payer: Self-pay | Admitting: *Deleted

## 2018-01-06 NOTE — Telephone Encounter (Signed)
Pt notified of lab results Pt was taken off of Metformin by nephrologist Pt has never used injectables Please advise

## 2018-01-08 ENCOUNTER — Encounter: Payer: Self-pay | Admitting: Family Medicine

## 2018-01-08 ENCOUNTER — Other Ambulatory Visit: Payer: Self-pay | Admitting: Physician Assistant

## 2018-01-08 MED ORDER — EXENATIDE ER 2 MG/0.85ML ~~LOC~~ AUIJ: 2.0000 mg | AUTO-INJECTOR | SUBCUTANEOUS | 5 refills | Status: DC

## 2018-01-08 NOTE — Telephone Encounter (Signed)
Start [employer], weekly injection. Can bring to triage nurse for first administration to learn. If approved by insurance can even see if we have a sample to show her.  Med has been sent to [facility]

## 2018-01-09 ENCOUNTER — Other Ambulatory Visit: Payer: Self-pay | Admitting: Physician Assistant

## 2018-01-09 ENCOUNTER — Telehealth: Payer: Self-pay | Admitting: Physician Assistant

## 2018-01-09 NOTE — Telephone Encounter (Signed)
Patient has changed her mind and will try the bydureon.

## 2018-01-09 NOTE — Telephone Encounter (Signed)
It was already sent to pharmacy

## 2018-01-09 NOTE — Telephone Encounter (Signed)
Patient states that she does not want to try an injection at this time.

## 2018-01-09 NOTE — Telephone Encounter (Signed)
Patient aware and states she would like Abigail Butts to call her.

## 2018-01-10 ENCOUNTER — Ambulatory Visit: Payer: Medicare HMO | Admitting: Physician Assistant

## 2018-01-11 NOTE — Telephone Encounter (Signed)
Patient given appointment on Friday for triage to teach how to use bydureon

## 2018-01-12 ENCOUNTER — Ambulatory Visit: Payer: Medicare HMO

## 2018-01-30 DIAGNOSIS — Z79899 Other long term (current) drug therapy: Secondary | ICD-10-CM | POA: Diagnosis not present

## 2018-01-30 DIAGNOSIS — I10 Essential (primary) hypertension: Secondary | ICD-10-CM | POA: Diagnosis not present

## 2018-01-30 DIAGNOSIS — N183 Chronic kidney disease, stage 3 (moderate): Secondary | ICD-10-CM | POA: Diagnosis not present

## 2018-02-03 ENCOUNTER — Telehealth: Payer: Self-pay | Admitting: Physician Assistant

## 2018-02-03 DIAGNOSIS — R809 Proteinuria, unspecified: Secondary | ICD-10-CM | POA: Diagnosis not present

## 2018-02-03 DIAGNOSIS — D509 Iron deficiency anemia, unspecified: Secondary | ICD-10-CM | POA: Diagnosis not present

## 2018-02-03 DIAGNOSIS — E559 Vitamin D deficiency, unspecified: Secondary | ICD-10-CM | POA: Diagnosis not present

## 2018-02-03 DIAGNOSIS — I10 Essential (primary) hypertension: Secondary | ICD-10-CM | POA: Diagnosis not present

## 2018-02-03 DIAGNOSIS — Z79899 Other long term (current) drug therapy: Secondary | ICD-10-CM | POA: Diagnosis not present

## 2018-02-03 DIAGNOSIS — N183 Chronic kidney disease, stage 3 (moderate): Secondary | ICD-10-CM | POA: Diagnosis not present

## 2018-02-03 MED ORDER — EXENATIDE ER 2 MG/0.85ML ~~LOC~~ AUIJ: 2.0000 mg | AUTO-INJECTOR | SUBCUTANEOUS | 5 refills | Status: DC

## 2018-02-03 NOTE — Telephone Encounter (Signed)
Rx sent into pharmacy and sample given

## 2018-02-08 DIAGNOSIS — I10 Essential (primary) hypertension: Secondary | ICD-10-CM | POA: Diagnosis not present

## 2018-02-08 DIAGNOSIS — N184 Chronic kidney disease, stage 4 (severe): Secondary | ICD-10-CM | POA: Diagnosis not present

## 2018-02-08 DIAGNOSIS — E1129 Type 2 diabetes mellitus with other diabetic kidney complication: Secondary | ICD-10-CM | POA: Diagnosis not present

## 2018-02-08 DIAGNOSIS — R809 Proteinuria, unspecified: Secondary | ICD-10-CM | POA: Diagnosis not present

## 2018-02-17 ENCOUNTER — Encounter: Payer: Self-pay | Admitting: Physician Assistant

## 2018-02-17 ENCOUNTER — Ambulatory Visit (INDEPENDENT_AMBULATORY_CARE_PROVIDER_SITE_OTHER): Payer: Medicare HMO | Admitting: Physician Assistant

## 2018-02-17 VITALS — BP 124/74 | HR 72 | Temp 97.5°F | Ht 64.0 in | Wt 215.0 lb

## 2018-02-17 DIAGNOSIS — E782 Mixed hyperlipidemia: Secondary | ICD-10-CM | POA: Diagnosis not present

## 2018-02-17 DIAGNOSIS — Z1211 Encounter for screening for malignant neoplasm of colon: Secondary | ICD-10-CM

## 2018-02-17 DIAGNOSIS — E1165 Type 2 diabetes mellitus with hyperglycemia: Secondary | ICD-10-CM

## 2018-02-17 DIAGNOSIS — G4730 Sleep apnea, unspecified: Secondary | ICD-10-CM

## 2018-02-17 LAB — BAYER DCA HB A1C WAIVED: HB A1C: 8.2 % — AB (ref <7.0)

## 2018-02-18 LAB — CMP14+EGFR
A/G RATIO: 1.6 (ref 1.2–2.2)
ALK PHOS: 59 IU/L (ref 39–117)
ALT: 15 IU/L (ref 0–32)
AST: 13 IU/L (ref 0–40)
Albumin: 4.1 g/dL (ref 3.6–4.8)
BUN / CREAT RATIO: 15 (ref 12–28)
BUN: 24 mg/dL (ref 8–27)
CHLORIDE: 101 mmol/L (ref 96–106)
CO2: 22 mmol/L (ref 20–29)
Calcium: 10.5 mg/dL — ABNORMAL HIGH (ref 8.7–10.3)
Creatinine, Ser: 1.56 mg/dL — ABNORMAL HIGH (ref 0.57–1.00)
GFR calc Af Amer: 39 mL/min/{1.73_m2} — ABNORMAL LOW (ref >59)
GFR calc non Af Amer: 34 mL/min/{1.73_m2} — ABNORMAL LOW (ref >59)
Globulin, Total: 2.5 g/dL (ref 1.5–4.5)
Glucose: 176 mg/dL — ABNORMAL HIGH (ref 65–99)
POTASSIUM: 4.3 mmol/L (ref 3.5–5.2)
Sodium: 138 mmol/L (ref 134–144)
TOTAL PROTEIN: 6.6 g/dL (ref 6.0–8.5)

## 2018-02-18 LAB — CBC WITH DIFFERENTIAL/PLATELET
BASOS ABS: 0 10*3/uL (ref 0.0–0.2)
BASOS: 0 %
EOS (ABSOLUTE): 0.2 10*3/uL (ref 0.0–0.4)
Eos: 2 %
Hematocrit: 39.5 % (ref 34.0–46.6)
Hemoglobin: 12.2 g/dL (ref 11.1–15.9)
IMMATURE GRANS (ABS): 0 10*3/uL (ref 0.0–0.1)
Immature Granulocytes: 0 %
LYMPHS ABS: 1.6 10*3/uL (ref 0.7–3.1)
LYMPHS: 22 %
MCH: 27.5 pg (ref 26.6–33.0)
MCHC: 30.9 g/dL — AB (ref 31.5–35.7)
MCV: 89 fL (ref 79–97)
Monocytes Absolute: 0.6 10*3/uL (ref 0.1–0.9)
Monocytes: 8 %
NEUTROS ABS: 4.9 10*3/uL (ref 1.4–7.0)
Neutrophils: 68 %
PLATELETS: 335 10*3/uL (ref 150–450)
RBC: 4.43 x10E6/uL (ref 3.77–5.28)
RDW: 15.6 % — ABNORMAL HIGH (ref 12.3–15.4)
WBC: 7.3 10*3/uL (ref 3.4–10.8)

## 2018-02-18 LAB — LIPID PANEL
CHOLESTEROL TOTAL: 116 mg/dL (ref 100–199)
Chol/HDL Ratio: 2.4 ratio (ref 0.0–4.4)
HDL: 48 mg/dL (ref >39)
LDL Calculated: 35 mg/dL (ref 0–99)
Triglycerides: 167 mg/dL — ABNORMAL HIGH (ref 0–149)
VLDL Cholesterol Cal: 33 mg/dL (ref 5–40)

## 2018-02-20 DIAGNOSIS — G4730 Sleep apnea, unspecified: Secondary | ICD-10-CM | POA: Insufficient documentation

## 2018-02-20 NOTE — Progress Notes (Signed)
BP 124/74   Pulse 72   Temp (!) 97.5 F (36.4 C) (Oral)   Ht _0  (1.626 m)   Wt 215 lb (97.5 kg)   BMI 36.90 kg/m    Subjective:    Patient ID: Kara Nunez, female    DOB: 03-10-1951, 67 y.o.   MRN: [id number]  HPI: Kara Nunez is a 67 y.o. female presenting on 02/17/2018 for Hypertension and Diabetes  This patient comes in for chronic recheck on her medical conditions.  They do include uncontrolled type 2 diabetes, sleep apnea, hyperlipidemia, hypertension.  We will send refills in on some of her medications.  She is going to use a mail in pharmacy called [facility].  The prescription will be sent to them.  She is also questioning getting her CPAP machinery in [place] where she is living now.  She would like to use [facility] [address] [place], [place] [phone]  This is a information the patient gave to [place].  She did have a sleep study performed through [facility] about 5 years ago.  We will obtain the records for this and send it on to the new supplier.  If they are unable to do it through [place] we will get her set up with [facility] in [place].  At this time she is still using her CPAP from before She had received supplies from a provider in this area. She will need a new provider where she will be living in [place]. She states that she has been very diligent in wearing her CPAP and feels very good using it.  Past Medical History:  Diagnosis Date  . Allergic rhinitis   . Ascending aortic aneurysm ([place])    Followed by Dr. [name]  . Chronic back pain   . Coronary atherosclerosis of native coronary artery    Prior evaluation with in [facility] - details not clear   . Degenerative disc disease   . Essential hypertension, benign   . Hyperlipidemia   . Insomnia   . Type 2 diabetes mellitus (HCC)    Relevant past medical, surgical, family and social history reviewed and updated as indicated.  Interim medical history since our last visit reviewed. Allergies and medications reviewed and updated. DATA REVIEWED: CHART IN EPIC  Family History reviewed for pertinent findings.  Review of Systems  Constitutional: Positive for fatigue. Negative for activity change and fever.  HENT: Negative.   Eyes: Negative.   Respiratory: Negative.  Negative for cough, shortness of breath and wheezing.   Cardiovascular: Positive for palpitations and leg swelling. Negative for chest pain.  Gastrointestinal: Negative.  Negative for abdominal pain.  Endocrine: Negative.   Genitourinary: Negative.  Negative for dysuria.  Musculoskeletal: Positive for arthralgias and back pain.  Skin: Negative.   Neurological: Negative.   Psychiatric/Behavioral: Positive for dysphoric mood. The patient is nervous/anxious.     Allergies as of 02/17/2018      Reactions   Codeine Nausea Only   Amoxicillin Rash      Medication List        Accurate as of 02/17/18 11:59 PM. Always use your most recent med list.          albuterol 108 (90 Base) MCG/ACT inhaler Commonly known as:  VENTOLIN HFA USE 2 PUFFS EVERY 6 HOURS AS NEEDED   ALPRAZolam 0.5 MG tablet Commonly known as:  XANAX TAKE ONE TABLET 3 TIMES A DAY AS NEEDED.  aspirin EC 81 MG tablet Take 81 mg by mouth daily.   clobetasol cream 0.05 % Commonly known as:  TEMOVATE Apply 1 application topically 2 (two) times daily.   cloNIDine 0.2 MG tablet Commonly known as:  CATAPRES TAKE ONE (1) TABLET THREE (3) TIMES EACH DAY   donepezil 10 MG tablet Commonly known as:  ARICEPT Take 1 tablet (10 mg total) by mouth at bedtime.   DULoxetine 60 MG capsule Commonly known as:  CYMBALTA TAKE ONE (1) CAPSULE EACH DAY   Exenatide ER 2 MG/0.85ML Auij Commonly known as:  BYDUREON BCISE Inject 2 mg into the skin once a week.   fenofibrate micronized 134 MG capsule Commonly known as:  LOFIBRA TAKE 1 CAPSULE EVERY MORNING BEFORE BREAKFAST   fluorouracil 5  % cream Commonly known as:  EFUDEX Apply 1 application topically 2 (two) times daily as needed (Use on face).   furosemide 20 MG tablet Commonly known as:  LASIX TAKE ONE (1) TABLET EACH DAY   HYDROcodone-acetaminophen 10-325 MG tablet Commonly known as:  NORCO TAKE ONE TABLET EVERY 8 HOURS AS NEEDED   HYDROcodone-acetaminophen 10-325 MG tablet Commonly known as:  NORCO Take 1 tablet by mouth every 8 (eight) hours as needed.   HYDROcodone-acetaminophen 10-325 MG tablet Commonly known as:  NORCO Take 1 tablet by mouth every 8 (eight) hours as needed.   IFEREX 150 150 MG capsule Generic drug:  iron polysaccharides TAKE ONE (1) CAPSULE EACH DAY   levothyroxine 50 MCG tablet Commonly known as:  SYNTHROID, LEVOTHROID TAKE ONE TABLET EACH MORNING BEFORE BREAKFAST   memantine 28 MG Cp24 24 hr capsule Commonly known as:  NAMENDA XR TAKE ONE (1) CAPSULE EACH DAY   metoprolol succinate 100 MG 24 hr tablet Commonly known as:  TOPROL-XL TAKE ONE (1) TABLET EACH DAY   NIFEdipine 60 MG 24 hr tablet Commonly known as:  PROCARDIA XL/ADALAT-CC Take 2 tablets (120 mg total) by mouth daily.   NIFEdipine 90 MG 24 hr tablet Commonly known as:  PROCARDIA XL/ADALAT-CC TAKE ONE (1) TABLET EACH DAY   nitroGLYCERIN 0.1 mg/hr patch Commonly known as:  NITRODUR - Dosed in mg/24 hr APPLY 1 PATCH DAILY AS NEEDED   OLANZapine 5 MG tablet Commonly known as:  ZYPREXA Take 1 tablet (5 mg total) by mouth at bedtime.   pantoprazole 40 MG tablet Commonly known as:  PROTONIX Take 1 tablet (40 mg total) by mouth 2 (two) times daily.   potassium chloride 10 MEQ tablet Commonly known as:  K-DUR Take 1 tablet (10 mEq total) by mouth 2 (two) times daily.   rosuvastatin 20 MG tablet Commonly known as:  CRESTOR TAKE ONE (1) TABLET EACH DAY   sitaGLIPtin 100 MG tablet Commonly known as:  JANUVIA TAKE ONE (1) TABLET EACH DAY   valsartan 320 MG tablet Commonly known as:  DIOVAN TAKE ONE (1)  TABLET EACH DAY   Vitamin D (Ergocalciferol) 50000 units Caps capsule Commonly known as:  DRISDOL TAKE 1 CAPSULE TWICE A WEEK   zolpidem 10 MG tablet Commonly known as:  AMBIEN Take 1 tablet (10 mg total) by mouth at bedtime as needed.          Objective:    BP 124/74   Pulse 72   Temp (!) 97.5 F (36.4 C) (Oral)   Ht _0  (1.626 m)   Wt 215 lb (97.5 kg)   BMI 36.90 kg/m   Allergies  Allergen Reactions  . Codeine Nausea Only  .  Amoxicillin Rash    Wt Readings from Last 3 Encounters:  02/17/18 215 lb (97.5 kg)  01/04/18 224 lb 2 oz (101.7 kg)  11/01/17 236 lb (107 kg)    Physical Exam  Constitutional: She is oriented to person, place, and time. She appears well-developed and well-nourished.  HENT:  Head: Normocephalic and atraumatic.  Eyes: Pupils are equal, round, and reactive to light. Conjunctivae and EOM are normal.  Cardiovascular: Normal rate, regular rhythm, normal heart sounds and intact distal pulses.  Pulmonary/Chest: Effort normal and breath sounds normal.  Abdominal: Soft. Bowel sounds are normal.  Neurological: She is alert and oriented to person, place, and time. She has normal reflexes.  Skin: Skin is warm and dry. No rash noted.  Psychiatric: She has a normal mood and affect. Her behavior is normal. Judgment and thought content normal.    Results for orders placed or performed in visit on 02/17/18  CBC with Differential/Platelet  Result Value Ref Range   WBC 7.3 3.4 - 10.8 x10E3/uL   RBC 4.43 3.77 - 5.28 x10E6/uL   Hemoglobin 12.2 11.1 - 15.9 g/dL   Hematocrit 39.5 34.0 - 46.6 %   MCV 89 79 - 97 fL   MCH 27.5 26.6 - 33.0 pg   MCHC 30.9 (L) 31.5 - 35.7 g/dL   RDW 15.6 (H) 12.3 - 15.4 %   Platelets 335 150 - 450 x10E3/uL   Neutrophils 68 Not Estab. %   Lymphs 22 Not Estab. %   Monocytes 8 Not Estab. %   Eos 2 Not Estab. %   Basos 0 Not Estab. %   Neutrophils Absolute 4.9 1.4 - 7.0 x10E3/uL   Lymphocytes Absolute 1.6 0.7 - 3.1 x10E3/uL    Monocytes Absolute 0.6 0.1 - 0.9 x10E3/uL   EOS (ABSOLUTE) 0.2 0.0 - 0.4 x10E3/uL   Basophils Absolute 0.0 0.0 - 0.2 x10E3/uL   Immature Granulocytes 0 Not Estab. %   Immature Grans (Abs) 0.0 0.0 - 0.1 x10E3/uL  CMP14+EGFR  Result Value Ref Range   Glucose 176 (H) 65 - 99 mg/dL   BUN 24 8 - 27 mg/dL   Creatinine, Ser 1.56 (H) 0.57 - 1.00 mg/dL   GFR calc non Af Amer 34 (L) >59 mL/min/1.73   GFR calc Af Amer 39 (L) >59 mL/min/1.73   BUN/Creatinine Ratio 15 12 - 28   Sodium 138 134 - 144 mmol/L   Potassium 4.3 3.5 - 5.2 mmol/L   Chloride 101 96 - 106 mmol/L   CO2 22 20 - 29 mmol/L   Calcium 10.5 (H) 8.7 - 10.3 mg/dL   Total Protein 6.6 6.0 - 8.5 g/dL   Albumin 4.1 3.6 - 4.8 g/dL   Globulin, Total 2.5 1.5 - 4.5 g/dL   Albumin/Globulin Ratio 1.6 1.2 - 2.2   Bilirubin Total <0.2 0.0 - 1.2 mg/dL   Alkaline Phosphatase 59 39 - 117 IU/L   AST 13 0 - 40 IU/L   ALT 15 0 - 32 IU/L  Lipid panel  Result Value Ref Range   Cholesterol, Total 116 100 - 199 mg/dL   Triglycerides 167 (H) 0 - 149 mg/dL   HDL 48 >39 mg/dL   VLDL Cholesterol Cal 33 5 - 40 mg/dL   LDL Calculated 35 0 - 99 mg/dL   Chol/HDL Ratio 2.4 0.0 - 4.4 ratio  Bayer DCA Hb A1c Waived  Result Value Ref Range   HB A1C (BAYER DCA - WAIVED) 8.2 (H) <7.0 %  Assessment & Plan:   1. Uncontrolled type 2 diabetes mellitus with hyperglycemia (HCC) - CBC with Differential/Platelet - CMP14+EGFR - Bayer DCA Hb A1c Waived  2. Sleep apnea, unspecified type Obtain sleep study Send new order for CPAP  3. Mixed hyperlipidemia - Lipid panel  4. Screening for colon cancer - Cologuard   Continue all other maintenance medications as listed above.  Follow up plan: Return in about 6 weeks (around 03/31/2018) for recheck.  Educational handout given for [place] PA-C [place] [address]  [place], [place] [address] [phone]   02/20/2018, 10:19 AM

## 2018-02-21 ENCOUNTER — Ambulatory Visit: Payer: Medicare HMO

## 2018-02-22 ENCOUNTER — Encounter: Payer: Self-pay | Admitting: Physician Assistant

## 2018-02-27 ENCOUNTER — Other Ambulatory Visit: Payer: Self-pay | Admitting: Thoracic Surgery (Cardiothoracic Vascular Surgery)

## 2018-02-27 ENCOUNTER — Other Ambulatory Visit: Payer: Self-pay | Admitting: *Deleted

## 2018-02-27 DIAGNOSIS — I712 Thoracic aortic aneurysm, without rupture, unspecified: Secondary | ICD-10-CM

## 2018-02-27 DIAGNOSIS — I10 Essential (primary) hypertension: Secondary | ICD-10-CM

## 2018-02-27 DIAGNOSIS — F419 Anxiety disorder, unspecified: Secondary | ICD-10-CM

## 2018-02-27 DIAGNOSIS — R600 Localized edema: Secondary | ICD-10-CM

## 2018-02-27 MED ORDER — FENOFIBRATE MICRONIZED 134 MG PO CAPS: ORAL_CAPSULE | ORAL | 1 refills | Status: DC

## 2018-02-27 MED ORDER — MEMANTINE HCL ER 28 MG PO CP24: ORAL_CAPSULE | ORAL | 1 refills | Status: DC

## 2018-02-27 MED ORDER — DONEPEZIL HCL 10 MG PO TABS: 10.0000 mg | ORAL_TABLET | Freq: Every day | ORAL | 1 refills | Status: DC

## 2018-02-27 MED ORDER — VALSARTAN 320 MG PO TABS: ORAL_TABLET | ORAL | 1 refills | Status: DC

## 2018-02-27 MED ORDER — POTASSIUM CHLORIDE ER 10 MEQ PO TBCR: 10.0000 meq | EXTENDED_RELEASE_TABLET | Freq: Two times a day (BID) | ORAL | 1 refills | Status: DC

## 2018-02-27 MED ORDER — OLANZAPINE 5 MG PO TABS: 5.0000 mg | ORAL_TABLET | Freq: Every day | ORAL | 1 refills | Status: DC

## 2018-02-27 MED ORDER — NITROGLYCERIN 0.1 MG/HR TD PT24
MEDICATED_PATCH | TRANSDERMAL | 0 refills | Status: DC
Start: 2018-02-27 — End: 2018-12-04

## 2018-02-27 MED ORDER — FUROSEMIDE 20 MG PO TABS: ORAL_TABLET | ORAL | 1 refills | Status: DC

## 2018-02-27 MED ORDER — DULOXETINE HCL 60 MG PO CPEP: ORAL_CAPSULE | ORAL | 0 refills | Status: DC

## 2018-02-27 MED ORDER — SITAGLIPTIN PHOSPHATE 100 MG PO TABS: ORAL_TABLET | ORAL | 0 refills | Status: DC

## 2018-02-27 MED ORDER — NIFEDIPINE ER OSMOTIC RELEASE 60 MG PO TB24: 120.0000 mg | ORAL_TABLET | Freq: Every day | ORAL | 1 refills | Status: DC

## 2018-02-27 MED ORDER — CLONIDINE HCL 0.2 MG PO TABS: ORAL_TABLET | ORAL | 1 refills | Status: DC

## 2018-02-27 MED ORDER — METOPROLOL SUCCINATE ER 100 MG PO TB24: ORAL_TABLET | ORAL | 1 refills | Status: DC

## 2018-02-27 MED ORDER — PANTOPRAZOLE SODIUM 40 MG PO TBEC: 40.0000 mg | DELAYED_RELEASE_TABLET | Freq: Two times a day (BID) | ORAL | 1 refills | Status: DC

## 2018-02-27 MED ORDER — ROSUVASTATIN CALCIUM 20 MG PO TABS: ORAL_TABLET | ORAL | 1 refills | Status: DC

## 2018-02-28 MED ORDER — EXENATIDE ER 2 MG/0.85ML ~~LOC~~ AUIJ: 2.0000 mg | AUTO-INJECTOR | SUBCUTANEOUS | 3 refills | Status: DC

## 2018-02-28 MED ORDER — ZOLPIDEM TARTRATE 10 MG PO TABS: 10.0000 mg | ORAL_TABLET | Freq: Every evening | ORAL | 1 refills | Status: DC | PRN

## 2018-02-28 MED ORDER — VITAMIN D (ERGOCALCIFEROL) 1.25 MG (50000 UNIT) PO CAPS: ORAL_CAPSULE | ORAL | 3 refills | Status: DC

## 2018-02-28 MED ORDER — LEVOTHYROXINE SODIUM 50 MCG PO TABS: ORAL_TABLET | ORAL | 3 refills | Status: DC

## 2018-02-28 MED ORDER — ALPRAZOLAM 0.5 MG PO TABS: ORAL_TABLET | ORAL | 1 refills | Status: DC

## 2018-03-27 NOTE — Progress Notes (Signed)
Cardiology Office Note  Date: 03/28/2018   ID: Kara Nunez, DOB [birth year], MRN [id number]  PCP: Terald Sleeper, PA-C  Primary Cardiologist: Rozann Lesches, MD   Chief Complaint  Patient presents with  . Coronary Artery Disease    History of Present Illness: Kara Nunez is a 67 y.o. female last seen in December 2018.  She is here for a routine follow-up visit.  From a cardiac perspective, she reports no obvious angina symptoms or change in stamina.  She has been going back to the [facility] to do water exercises.  She reports NYHA class II dyspnea, no palpitations or syncope.  Follow-up chest CTA in January of this year revealed overall stable ascending thoracic aortic aneurysm measuring 4.6 x 4.5 cm.  She continues to follow with Dr. [name] and has been asymptomatic.  Recent lab work is outlined below.  I reviewed her cardiac medications which are outlined below.  She reports compliance and no obvious intolerances.  Past Medical History:  Diagnosis Date  . Allergic rhinitis   . Ascending aortic aneurysm ([place])    Followed by Dr. [name]  . Chronic back pain   . Coronary atherosclerosis of native coronary artery    Prior evaluation with in [facility] - details not clear   . Degenerative disc disease   . Essential hypertension, benign   . Hyperlipidemia   . Insomnia   . Type 2 diabetes mellitus ([place])     Past Surgical History:  Procedure Laterality Date  . CHOLECYSTECTOMY    . TUBAL LIGATION      Current Outpatient Medications  Medication Sig Dispense Refill  . albuterol (VENTOLIN HFA) 108 (90 Base) MCG/ACT inhaler USE 2 PUFFS EVERY 6 HOURS AS NEEDED 54 g 3  . ALPRAZolam (XANAX) 0.5 MG tablet TAKE ONE TABLET 3 TIMES A DAY AS NEEDED. 270 tablet 1  . aspirin EC 81 MG tablet Take 81 mg by mouth daily.    . clobetasol cream (TEMOVATE) 5.40 % Apply 1 application topically 2 (two) times daily. 60 g 2  . cloNIDine (CATAPRES) 0.2 MG tablet TAKE ONE (1) TABLET  THREE (3) TIMES EACH DAY 270 tablet 1  . donepezil (ARICEPT) 10 MG tablet Take 1 tablet (10 mg total) by mouth at bedtime. 90 tablet 1  . DULoxetine (CYMBALTA) 60 MG capsule TAKE ONE (1) CAPSULE EACH DAY 90 capsule 0  . Exenatide ER (BYDUREON BCISE) 2 MG/0.85ML AUIJ Inject 2 mg into the skin once a week. 4 pen 3  . fenofibrate micronized (LOFIBRA) 134 MG capsule TAKE 1 CAPSULE EVERY MORNING BEFORE BREAKFAST 90 capsule 1  . fluorouracil (EFUDEX) 5 % cream Apply 1 application topically 2 (two) times daily as needed (Use on face).    . furosemide (LASIX) 20 MG tablet TAKE ONE (1) TABLET EACH DAY 90 tablet 1  . HYDROcodone-acetaminophen (NORCO) 10-325 MG tablet TAKE ONE TABLET EVERY 8 HOURS AS NEEDED 90 tablet 0  . HYDROcodone-acetaminophen (NORCO) 10-325 MG tablet Take 1 tablet by mouth every 8 (eight) hours as needed. 90 tablet 0  . IFEREX 150 150 MG capsule TAKE ONE (1) CAPSULE EACH DAY 90 capsule 3  . levothyroxine (SYNTHROID, LEVOTHROID) 50 MCG tablet TAKE ONE TABLET EACH MORNING BEFORE BREAKFAST 90 tablet 3  . memantine (NAMENDA XR) 28 MG CP24 24 hr capsule TAKE ONE (1) CAPSULE EACH DAY 90 capsule 1  . metoprolol succinate (TOPROL-XL) 100 MG 24 hr tablet TAKE ONE (1) TABLET EACH DAY 90 tablet 1  .  NIFEdipine (PROCARDIA XL/ADALAT-CC) 60 MG 24 hr tablet Take 2 tablets (120 mg total) by mouth daily. 180 tablet 1  . NIFEdipine (PROCARDIA XL/ADALAT-CC) 90 MG 24 hr tablet Take 90 mg by mouth daily.    . nitroGLYCERIN (NITRODUR - DOSED IN MG/24 HR) 0.1 mg/hr patch APPLY 1 PATCH DAILY AS NEEDED 90 patch 0  . OLANZapine (ZYPREXA) 5 MG tablet Take 1 tablet (5 mg total) by mouth at bedtime. 90 tablet 1  . pantoprazole (PROTONIX) 40 MG tablet Take 1 tablet (40 mg total) by mouth 2 (two) times daily. 180 tablet 1  . potassium chloride (K-DUR) 10 MEQ tablet Take 1 tablet (10 mEq total) by mouth 2 (two) times daily. 180 tablet 1  . rosuvastatin (CRESTOR) 20 MG tablet TAKE ONE (1) TABLET EACH DAY 90 tablet 1    . sitaGLIPtin (JANUVIA) 100 MG tablet TAKE ONE (1) TABLET EACH DAY 90 tablet 0  . valsartan (DIOVAN) 320 MG tablet TAKE ONE (1) TABLET EACH DAY 90 tablet 1  . Vitamin D, Ergocalciferol, (DRISDOL) 50000 units CAPS capsule TAKE 1 CAPSULE TWICE A WEEK 24 capsule 3  . zolpidem (AMBIEN) 10 MG tablet Take 1 tablet (10 mg total) by mouth at bedtime as needed. 90 tablet 1   No current facility-administered medications for this visit.    Allergies:  Codeine and Amoxicillin   Social History: The patient  reports that she has never smoked. She has never used smokeless tobacco. She reports that she drinks alcohol. She reports that she does not use drugs.   ROS:  Please see the history of present illness. Otherwise, complete review of systems is positive for trouble with memory.  All other systems are reviewed and negative.   Physical Exam: VS:  BP (!) 142/78 (BP Location: Right Arm)   Pulse 75   Ht 5\' 4"  (1.626 m)   Wt 216 lb (98 kg)   SpO2 92%   BMI 37.08 kg/m , BMI Body mass index is 37.08 kg/m.  Wt Readings from Last 3 Encounters:  03/28/18 216 lb (98 kg)  02/17/18 215 lb (97.5 kg)  01/04/18 224 lb 2 oz (101.7 kg)    General: Patient appears comfortable at rest. HEENT: Conjunctiva and lids normal, oropharynx clear. Neck: Supple, no elevated JVP or carotid bruits, no thyromegaly. Lungs: Clear to auscultation, nonlabored breathing at rest. Cardiac: Regular rate and rhythm, no S3, 2/6 systolic murmur, no pericardial rub. Abdomen: Soft, nontender, bowel sounds present. Extremities: No pitting edema, distal pulses 2+. Skin: Warm and dry. Musculoskeletal: No kyphosis. Neuropsychiatric: Alert and oriented x3, affect grossly appropriate.  ECG: I personally reviewed the tracing from 12/28/2016 which showed sinus bradycardia with left anterior fascicular block and increased voltage.  Recent Labwork: 02/17/2018: ALT 15; AST 13; BUN 24; Creatinine, Ser 1.56; Hemoglobin 12.2; Platelets 335;  Potassium 4.3; Sodium 138     Component Value Date/Time   CHOL 116 02/17/2018 0917   TRIG 167 (H) 02/17/2018 0917   HDL 48 02/17/2018 0917   CHOLHDL 2.4 02/17/2018 0917   LDLCALC 35 02/17/2018 0917    Other Studies Reviewed Today:  [facility] Myoview 05/15/2017:  There was no ST segment deviation noted during stress.  Findings consistent with mild anterior ischemia and prior small inferolateral myocardial infarction with mild peri-infarct ischemia.  The left ventricular ejection fraction is normal (55-65%).  Low to intermediate risk study. Two mild areas of ischemia as described above.  Chest CTA 10/04/2017: IMPRESSION: 1. Stable ascending thoracic aortic aneurysm with maximum  transverse diameter 4.6 x 4.5 cm. Ascending thoracic aortic aneurysm. Recommend semi-annual imaging followup by CTA or MRA and referral to cardiothoracic surgery if not already obtained. This recommendation follows 2010 ACCF/AHA/AATS/ACR/ASA/SCA/SCAI/SIR/STS/SVM Guidelines for the Diagnosis and Management of Patients With Thoracic Aortic Disease. Circulation. 2010; 121: E707-A151.  2.  Small pericardial effusion.  3. Foci of aortic atherosclerosis and foci of coronary artery calcification noted.  4.  No lung edema or consolidation.  5.  No adenopathy.  6.  Gallbladder absent.  Assessment and Plan:  1.  CAD with previous evaluation in [place], details not clear.  From a symptom perspective she reports no active angina on medical therapy.  Follow-up ischemic testing from last year revealed a small area of anterior ischemia as well as prior small inferolateral infarct scar associated with mild peri-infarct ischemia.  In the absence of progressive symptoms we continue with observation.  She remains on antiplatelet regimen and statin.  2.  Ascending aortic aneurysm, stable by CT imaging in January of this year and asymptomatic with follow-up per Dr. [name].  3.  Mixed hyperlipidemia on  Crestor.  Recent LDL 35.  4.  Essential hypertension, blood pressure control is reasonable today.  No changes made to present regimen.  Current medicines were reviewed with the patient today.   Orders Placed This Encounter  Procedures  . EKG 12-Lead    Disposition: Follow-up in 6 months.  Signed, Satira Sark, MD, [facility] 03/28/2018 1:59 PM    [facility] at [facility] [address] [place], [place] [address] Phone: [phone]; Fax: [phone]

## 2018-03-28 ENCOUNTER — Ambulatory Visit (INDEPENDENT_AMBULATORY_CARE_PROVIDER_SITE_OTHER): Payer: Medicare HMO | Admitting: Cardiology

## 2018-03-28 ENCOUNTER — Encounter: Payer: Self-pay | Admitting: Cardiology

## 2018-03-28 VITALS — BP 142/78 | HR 75 | Ht 64.0 in | Wt 216.0 lb

## 2018-03-28 DIAGNOSIS — I10 Essential (primary) hypertension: Secondary | ICD-10-CM

## 2018-03-28 DIAGNOSIS — I712 Thoracic aortic aneurysm, without rupture: Secondary | ICD-10-CM | POA: Diagnosis not present

## 2018-03-28 DIAGNOSIS — I25119 Atherosclerotic heart disease of native coronary artery with unspecified angina pectoris: Secondary | ICD-10-CM

## 2018-03-28 DIAGNOSIS — E782 Mixed hyperlipidemia: Secondary | ICD-10-CM

## 2018-03-28 DIAGNOSIS — I7121 Aneurysm of the ascending aorta, without rupture: Secondary | ICD-10-CM

## 2018-03-28 NOTE — Patient Instructions (Signed)

## 2018-04-03 ENCOUNTER — Encounter: Payer: Self-pay | Admitting: Physician Assistant

## 2018-04-03 ENCOUNTER — Ambulatory Visit (INDEPENDENT_AMBULATORY_CARE_PROVIDER_SITE_OTHER): Payer: Medicare HMO | Admitting: Physician Assistant

## 2018-04-03 ENCOUNTER — Ambulatory Visit: Payer: Medicare HMO | Admitting: Physician Assistant

## 2018-04-03 VITALS — BP 122/78 | HR 70 | Temp 97.5°F | Ht 64.0 in | Wt 215.0 lb

## 2018-04-03 DIAGNOSIS — I10 Essential (primary) hypertension: Secondary | ICD-10-CM

## 2018-04-03 DIAGNOSIS — M5136 Other intervertebral disc degeneration, lumbar region: Secondary | ICD-10-CM

## 2018-04-03 DIAGNOSIS — E1165 Type 2 diabetes mellitus with hyperglycemia: Secondary | ICD-10-CM

## 2018-04-03 DIAGNOSIS — Z79899 Other long term (current) drug therapy: Secondary | ICD-10-CM | POA: Diagnosis not present

## 2018-04-03 DIAGNOSIS — N183 Chronic kidney disease, stage 3 (moderate): Secondary | ICD-10-CM | POA: Diagnosis not present

## 2018-04-03 DIAGNOSIS — D509 Iron deficiency anemia, unspecified: Secondary | ICD-10-CM | POA: Diagnosis not present

## 2018-04-03 DIAGNOSIS — R809 Proteinuria, unspecified: Secondary | ICD-10-CM | POA: Diagnosis not present

## 2018-04-03 DIAGNOSIS — E559 Vitamin D deficiency, unspecified: Secondary | ICD-10-CM | POA: Diagnosis not present

## 2018-04-03 MED ORDER — HYDROCODONE-ACETAMINOPHEN 10-325 MG PO TABS: ORAL_TABLET | ORAL | 0 refills | Status: DC

## 2018-04-03 MED ORDER — HYDROCODONE-ACETAMINOPHEN 10-325 MG PO TABS: 1.0000 | ORAL_TABLET | Freq: Three times a day (TID) | ORAL | 0 refills | Status: DC | PRN

## 2018-04-04 ENCOUNTER — Ambulatory Visit: Payer: Medicare HMO | Admitting: Physician Assistant

## 2018-04-04 DIAGNOSIS — H11153 Pinguecula, bilateral: Secondary | ICD-10-CM | POA: Diagnosis not present

## 2018-04-04 DIAGNOSIS — H3540 Unspecified peripheral retinal degeneration: Secondary | ICD-10-CM | POA: Diagnosis not present

## 2018-04-04 DIAGNOSIS — H43393 Other vitreous opacities, bilateral: Secondary | ICD-10-CM | POA: Diagnosis not present

## 2018-04-04 DIAGNOSIS — E119 Type 2 diabetes mellitus without complications: Secondary | ICD-10-CM | POA: Diagnosis not present

## 2018-04-04 DIAGNOSIS — H2513 Age-related nuclear cataract, bilateral: Secondary | ICD-10-CM | POA: Diagnosis not present

## 2018-04-04 DIAGNOSIS — H35363 Drusen (degenerative) of macula, bilateral: Secondary | ICD-10-CM | POA: Diagnosis not present

## 2018-04-04 DIAGNOSIS — H5213 Myopia, bilateral: Secondary | ICD-10-CM | POA: Diagnosis not present

## 2018-04-04 NOTE — Progress Notes (Signed)
BP 122/78   Pulse 70   Temp (!) 97.5 F (36.4 C) (Oral)   Ht 5' 4" (1.626 m)   Wt 215 lb (97.5 kg)   BMI 36.90 kg/m    Subjective:    Patient ID: Kara Nunez, female    DOB: [birth year], 67 y.o.   MRN: [id number]  HPI: Kara Nunez is a 67 y.o. female presenting on 04/03/2018 for Diabetes (6 week rck ) and Hypertension This patient comes in for periodic recheck on medications and conditions including DDD, hypertension, diabetes.   All medications are reviewed today. There are no reports of any problems with the medications. All of the medical conditions are reviewed and updated.  Lab work is reviewed and will be ordered as medically necessary. There are no new problems reported with today's visit.    Past Medical History:  Diagnosis Date  . Allergic rhinitis   . Ascending aortic aneurysm ([place])    Followed by Dr. [name]  . Chronic back pain   . Coronary atherosclerosis of native coronary artery    Prior evaluation with in [facility] - details not clear   . Degenerative disc disease   . Essential hypertension, benign   . Hyperlipidemia   . Insomnia   . Type 2 diabetes mellitus (HCC)    Relevant past medical, surgical, family and social history reviewed and updated as indicated. Interim medical history since our last visit reviewed. Allergies and medications reviewed and updated. DATA REVIEWED: CHART IN EPIC  Family History reviewed for pertinent findings.  Review of Systems  Allergies as of 04/03/2018      Reactions   Codeine Nausea Only   Amoxicillin Rash      Medication List        Accurate as of 04/03/18 11:59 PM. Always use your most recent med list.          albuterol 108 (90 Base) MCG/ACT inhaler Commonly known as:  VENTOLIN HFA USE 2 PUFFS EVERY 6 HOURS AS NEEDED   ALPRAZolam 0.5 MG tablet Commonly known as:  XANAX TAKE ONE TABLET 3 TIMES A DAY AS NEEDED.   aspirin EC 81 MG tablet Take 81 mg by mouth daily.   clobetasol cream 0.05  % Commonly known as:  TEMOVATE Apply 1 application topically 2 (two) times daily.   cloNIDine 0.2 MG tablet Commonly known as:  CATAPRES TAKE ONE (1) TABLET THREE (3) TIMES EACH DAY   donepezil 10 MG tablet Commonly known as:  ARICEPT Take 1 tablet (10 mg total) by mouth at bedtime.   DULoxetine 60 MG capsule Commonly known as:  CYMBALTA TAKE ONE (1) CAPSULE EACH DAY   Exenatide ER 2 MG/0.85ML Auij Commonly known as:  BYDUREON BCISE Inject 2 mg into the skin once a week.   fenofibrate micronized 134 MG capsule Commonly known as:  LOFIBRA TAKE 1 CAPSULE EVERY MORNING BEFORE BREAKFAST   fluorouracil 5 % cream Commonly known as:  EFUDEX Apply 1 application topically 2 (two) times daily as needed (Use on face).   furosemide 20 MG tablet Commonly known as:  LASIX TAKE ONE (1) TABLET EACH DAY   HYDROcodone-acetaminophen 10-325 MG tablet Commonly known as:  NORCO TAKE ONE TABLET EVERY 8 HOURS AS NEEDED   HYDROcodone-acetaminophen 10-325 MG tablet Commonly known as:  NORCO Take 1 tablet by mouth every 8 (eight) hours as needed.   HYDROcodone-acetaminophen 10-325 MG tablet Commonly known as:  NORCO Take 1 tablet by mouth every 8 (eight) hours  as needed.   IFEREX 150 150 MG capsule Generic drug:  iron polysaccharides TAKE ONE (1) CAPSULE EACH DAY   levothyroxine 50 MCG tablet Commonly known as:  SYNTHROID, LEVOTHROID TAKE ONE TABLET EACH MORNING BEFORE BREAKFAST   memantine 28 MG Cp24 24 hr capsule Commonly known as:  NAMENDA XR TAKE ONE (1) CAPSULE EACH DAY   metoprolol succinate 100 MG 24 hr tablet Commonly known as:  TOPROL-XL TAKE ONE (1) TABLET EACH DAY   NIFEdipine 90 MG 24 hr tablet Commonly known as:  PROCARDIA XL/ADALAT-CC Take 90 mg by mouth daily.   NIFEdipine 60 MG 24 hr tablet Commonly known as:  PROCARDIA XL/ADALAT-CC Take 2 tablets (120 mg total) by mouth daily.   nitroGLYCERIN 0.1 mg/hr patch Commonly known as:  NITRODUR - Dosed in mg/24  hr APPLY 1 PATCH DAILY AS NEEDED   OLANZapine 5 MG tablet Commonly known as:  ZYPREXA Take 1 tablet (5 mg total) by mouth at bedtime.   pantoprazole 40 MG tablet Commonly known as:  PROTONIX Take 1 tablet (40 mg total) by mouth 2 (two) times daily.   potassium chloride 10 MEQ tablet Commonly known as:  K-DUR Take 1 tablet (10 mEq total) by mouth 2 (two) times daily.   rosuvastatin 20 MG tablet Commonly known as:  CRESTOR TAKE ONE (1) TABLET EACH DAY   sitaGLIPtin 100 MG tablet Commonly known as:  JANUVIA TAKE ONE (1) TABLET EACH DAY   valsartan 320 MG tablet Commonly known as:  DIOVAN TAKE ONE (1) TABLET EACH DAY   Vitamin D (Ergocalciferol) 50000 units Caps capsule Commonly known as:  DRISDOL TAKE 1 CAPSULE TWICE A WEEK   zolpidem 10 MG tablet Commonly known as:  AMBIEN Take 1 tablet (10 mg total) by mouth at bedtime as needed.          Objective:    BP 122/78   Pulse 70   Temp (!) 97.5 F (36.4 C) (Oral)   Ht 5' 4" (1.626 m)   Wt 215 lb (97.5 kg)   BMI 36.90 kg/m   Allergies  Allergen Reactions  . Codeine Nausea Only  . Amoxicillin Rash    Wt Readings from Last 3 Encounters:  04/03/18 215 lb (97.5 kg)  03/28/18 216 lb (98 kg)  02/17/18 215 lb (97.5 kg)    Physical Exam  Results for orders placed or performed in visit on 02/17/18  CBC with Differential/Platelet  Result Value Ref Range   WBC 7.3 3.4 - 10.8 x10E3/uL   RBC 4.43 3.77 - 5.28 x10E6/uL   Hemoglobin 12.2 11.1 - 15.9 g/dL   Hematocrit 39.5 34.0 - 46.6 %   MCV 89 79 - 97 fL   MCH 27.5 26.6 - 33.0 pg   MCHC 30.9 (L) 31.5 - 35.7 g/dL   RDW 15.6 (H) 12.3 - 15.4 %   Platelets 335 150 - 450 x10E3/uL   Neutrophils 68 Not Estab. %   Lymphs 22 Not Estab. %   Monocytes 8 Not Estab. %   Eos 2 Not Estab. %   Basos 0 Not Estab. %   Neutrophils Absolute 4.9 1.4 - 7.0 x10E3/uL   Lymphocytes Absolute 1.6 0.7 - 3.1 x10E3/uL   Monocytes Absolute 0.6 0.1 - 0.9 x10E3/uL   EOS (ABSOLUTE) 0.2 0.0 -  0.4 x10E3/uL   Basophils Absolute 0.0 0.0 - 0.2 x10E3/uL   Immature Granulocytes 0 Not Estab. %   Immature Grans (Abs) 0.0 0.0 - 0.1 x10E3/uL  CMP14+EGFR  Result Value  Ref Range   Glucose 176 (H) 65 - 99 mg/dL   BUN 24 8 - 27 mg/dL   Creatinine, Ser 1.56 (H) 0.57 - 1.00 mg/dL   GFR calc non Af Amer 34 (L) >59 mL/min/1.73   GFR calc Af Amer 39 (L) >59 mL/min/1.73   BUN/Creatinine Ratio 15 12 - 28   Sodium 138 134 - 144 mmol/L   Potassium 4.3 3.5 - 5.2 mmol/L   Chloride 101 96 - 106 mmol/L   CO2 22 20 - 29 mmol/L   Calcium 10.5 (H) 8.7 - 10.3 mg/dL   Total Protein 6.6 6.0 - 8.5 g/dL   Albumin 4.1 3.6 - 4.8 g/dL   Globulin, Total 2.5 1.5 - 4.5 g/dL   Albumin/Globulin Ratio 1.6 1.2 - 2.2   Bilirubin Total <0.2 0.0 - 1.2 mg/dL   Alkaline Phosphatase 59 39 - 117 IU/L   AST 13 0 - 40 IU/L   ALT 15 0 - 32 IU/L  Lipid panel  Result Value Ref Range   Cholesterol, Total 116 100 - 199 mg/dL   Triglycerides 167 (H) 0 - 149 mg/dL   HDL 48 >39 mg/dL   VLDL Cholesterol Cal 33 5 - 40 mg/dL   LDL Calculated 35 0 - 99 mg/dL   Chol/HDL Ratio 2.4 0.0 - 4.4 ratio  Bayer DCA Hb A1c Waived  Result Value Ref Range   HB A1C (BAYER DCA - WAIVED) 8.2 (H) <7.0 %      Assessment & Plan:   1. DDD (degenerative disc disease), lumbar - HYDROcodone-acetaminophen (NORCO) 10-325 MG tablet; TAKE ONE TABLET EVERY 8 HOURS AS NEEDED  Dispense: 90 tablet; Refill: 0 - HYDROcodone-acetaminophen (NORCO) 10-325 MG tablet; Take 1 tablet by mouth every 8 (eight) hours as needed.  Dispense: 90 tablet; Refill: 0 - HYDROcodone-acetaminophen (NORCO) 10-325 MG tablet; Take 1 tablet by mouth every 8 (eight) hours as needed.  Dispense: 90 tablet; Refill: 0  2. Essential hypertension Continue meds Low sodium diet  3. Uncontrolled type 2 diabetes mellitus with hyperglycemia (HCC) Continue meds   Continue all other maintenance medications as listed above.  Follow up plan: Return in about 3 months (around 07/04/2018)  for rechekc and LABS.  Educational handout given for [place] PA-C [place] [address]  [place], [place] [address] [phone]   04/04/2018, 10:02 PM

## 2018-04-05 DIAGNOSIS — Z1211 Encounter for screening for malignant neoplasm of colon: Secondary | ICD-10-CM | POA: Diagnosis not present

## 2018-04-05 DIAGNOSIS — Z1212 Encounter for screening for malignant neoplasm of rectum: Secondary | ICD-10-CM | POA: Diagnosis not present

## 2018-04-07 LAB — COLOGUARD: COLOGUARD: POSITIVE

## 2018-04-11 ENCOUNTER — Encounter: Payer: Self-pay | Admitting: Thoracic Surgery (Cardiothoracic Vascular Surgery)

## 2018-04-11 ENCOUNTER — Other Ambulatory Visit: Payer: Self-pay

## 2018-04-11 ENCOUNTER — Other Ambulatory Visit: Payer: Self-pay | Admitting: Thoracic Surgery (Cardiothoracic Vascular Surgery)

## 2018-04-11 ENCOUNTER — Ambulatory Visit
Admission: RE | Admit: 2018-04-11 | Discharge: 2018-04-11 | Disposition: A | Discharge status: Home / Self Care | Payer: Medicare HMO | Source: Ambulatory Visit | Attending: Thoracic Surgery (Cardiothoracic Vascular Surgery) | Admitting: Thoracic Surgery (Cardiothoracic Vascular Surgery)

## 2018-04-11 ENCOUNTER — Ambulatory Visit: Payer: Medicare HMO | Admitting: Thoracic Surgery (Cardiothoracic Vascular Surgery)

## 2018-04-11 VITALS — BP 130/88 | HR 67 | Resp 16 | Ht 64.0 in | Wt 215.0 lb

## 2018-04-11 DIAGNOSIS — I7121 Aneurysm of the ascending aorta, without rupture: Secondary | ICD-10-CM

## 2018-04-11 DIAGNOSIS — I712 Thoracic aortic aneurysm, without rupture, unspecified: Secondary | ICD-10-CM

## 2018-04-12 DIAGNOSIS — R809 Proteinuria, unspecified: Secondary | ICD-10-CM | POA: Diagnosis not present

## 2018-04-12 DIAGNOSIS — E889 Metabolic disorder, unspecified: Secondary | ICD-10-CM | POA: Diagnosis not present

## 2018-04-12 DIAGNOSIS — N184 Chronic kidney disease, stage 4 (severe): Secondary | ICD-10-CM | POA: Diagnosis not present

## 2018-04-12 DIAGNOSIS — M9080 Osteopathy in diseases classified elsewhere, unspecified site: Secondary | ICD-10-CM | POA: Diagnosis not present

## 2018-04-12 DIAGNOSIS — I10 Essential (primary) hypertension: Secondary | ICD-10-CM | POA: Diagnosis not present

## 2018-05-16 ENCOUNTER — Ambulatory Visit (INDEPENDENT_AMBULATORY_CARE_PROVIDER_SITE_OTHER): Payer: Medicare HMO | Admitting: Physician Assistant

## 2018-05-16 ENCOUNTER — Encounter: Payer: Self-pay | Admitting: Physician Assistant

## 2018-05-16 VITALS — BP 137/89 | HR 87 | Temp 97.0°F | Ht 64.0 in | Wt 208.0 lb

## 2018-05-16 DIAGNOSIS — R300 Dysuria: Secondary | ICD-10-CM

## 2018-05-16 DIAGNOSIS — N3000 Acute cystitis without hematuria: Secondary | ICD-10-CM | POA: Diagnosis not present

## 2018-05-16 MED ORDER — CIPROFLOXACIN HCL 250 MG PO TABS: 250.0000 mg | ORAL_TABLET | Freq: Two times a day (BID) | ORAL | 0 refills | Status: DC

## 2018-05-17 NOTE — Progress Notes (Signed)
BP 137/89   Pulse 87   Temp (!) 97 F (36.1 C) (Oral)   Ht 5\' 4"  (1.626 m)   Wt 208 lb (94.3 kg)   BMI 35.70 kg/m    Subjective:    Patient ID: Kara Nunez, female    DOB: 14-Oct-1950, 67 y.o.   MRN: [id number]  HPI: Kara Nunez is a 67 y.o. female presenting on 05/16/2018 for Urinary Tract Infection; Fatigue; Diarrhea; Fever; and Fall  This patient has had several days of dysuria, frequency and nocturia. There is also pain over the bladder in the suprapubic region, no back pain. Denies leakage or hematuria.  Denies fever or chills. No pain in flank area. This patient comes in with 2 day history of nausea and vomiting.  In the beginning nausea and vomiting were the only symptoms and halfway through more diarrhea.  Last time the patient ate was 2 days Positive exposure to others with gastroenteritis Denies fever. Denies blood.   Past Medical History:  Diagnosis Date  . Allergic rhinitis   . Ascending aortic aneurysm ([place])    Followed by Dr. [name]  . Chronic back pain   . Coronary atherosclerosis of native coronary artery    Prior evaluation with in [facility] - details not clear   . Degenerative disc disease   . Essential hypertension, benign   . Hyperlipidemia   . Insomnia   . Type 2 diabetes mellitus (HCC)    Relevant past medical, surgical, family and social history reviewed and updated as indicated. Interim medical history since our last visit reviewed. Allergies and medications reviewed and updated. DATA REVIEWED: CHART IN EPIC  Family History reviewed for pertinent findings.  Review of Systems  Constitutional: Negative.   HENT: Negative.   Eyes: Negative.   Respiratory: Negative.   Gastrointestinal: Positive for abdominal pain, diarrhea, nausea and vomiting.  Genitourinary: Positive for difficulty urinating, dysuria and urgency. Negative for flank pain.    Allergies as of 05/16/2018      Reactions   Codeine Nausea Only   Amoxicillin Rash        Medication List        Accurate as of 05/16/18 11:59 PM. Always use your most recent med list.          albuterol 108 (90 Base) MCG/ACT inhaler Commonly known as:  PROVENTIL HFA;VENTOLIN HFA USE 2 PUFFS EVERY 6 HOURS AS NEEDED   ALPRAZolam 0.5 MG tablet Commonly known as:  XANAX TAKE ONE TABLET 3 TIMES A DAY AS NEEDED.   aspirin EC 81 MG tablet Take 81 mg by mouth daily.   ciprofloxacin 250 MG tablet Commonly known as:  CIPRO Take 1 tablet (250 mg total) by mouth 2 (two) times daily.   clobetasol cream 0.05 % Commonly known as:  TEMOVATE Apply 1 application topically 2 (two) times daily.   cloNIDine 0.2 MG tablet Commonly known as:  CATAPRES TAKE ONE (1) TABLET THREE (3) TIMES EACH DAY   donepezil 10 MG tablet Commonly known as:  ARICEPT Take 1 tablet (10 mg total) by mouth at bedtime.   DULoxetine 60 MG capsule Commonly known as:  CYMBALTA TAKE ONE (1) CAPSULE EACH DAY   Exenatide ER 2 MG/0.85ML Auij Inject 2 mg into the skin once a week.   fenofibrate micronized 134 MG capsule Commonly known as:  LOFIBRA TAKE 1 CAPSULE EVERY MORNING BEFORE BREAKFAST   fluorouracil 5 % cream Commonly known as:  EFUDEX Apply 1 application topically 2 (two)  times daily as needed (Use on face).   furosemide 20 MG tablet Commonly known as:  LASIX TAKE ONE (1) TABLET EACH DAY   HYDROcodone-acetaminophen 10-325 MG tablet Commonly known as:  NORCO TAKE ONE TABLET EVERY 8 HOURS AS NEEDED   HYDROcodone-acetaminophen 10-325 MG tablet Commonly known as:  NORCO Take 1 tablet by mouth every 8 (eight) hours as needed.   IFEREX 150 150 MG capsule Generic drug:  iron polysaccharides TAKE ONE (1) CAPSULE EACH DAY   levothyroxine 50 MCG tablet Commonly known as:  SYNTHROID, LEVOTHROID TAKE ONE TABLET EACH MORNING BEFORE BREAKFAST   memantine 28 MG Cp24 24 hr capsule Commonly known as:  NAMENDA XR TAKE ONE (1) CAPSULE EACH DAY   metoprolol succinate 100 MG 24 hr  tablet Commonly known as:  TOPROL-XL TAKE ONE (1) TABLET EACH DAY   NIFEdipine 90 MG 24 hr tablet Commonly known as:  PROCARDIA XL/ADALAT-CC Take 90 mg by mouth daily.   NIFEdipine 60 MG 24 hr tablet Commonly known as:  PROCARDIA XL/ADALAT-CC Take 2 tablets (120 mg total) by mouth daily.   nitroGLYCERIN 0.1 mg/hr patch Commonly known as:  NITRODUR - Dosed in mg/24 hr APPLY 1 PATCH DAILY AS NEEDED   OLANZapine 5 MG tablet Commonly known as:  ZYPREXA Take 1 tablet (5 mg total) by mouth at bedtime.   pantoprazole 40 MG tablet Commonly known as:  PROTONIX Take 1 tablet (40 mg total) by mouth 2 (two) times daily.   potassium chloride 10 MEQ tablet Commonly known as:  K-DUR Take 1 tablet (10 mEq total) by mouth 2 (two) times daily.   rosuvastatin 20 MG tablet Commonly known as:  CRESTOR TAKE ONE (1) TABLET EACH DAY   sitaGLIPtin 100 MG tablet Commonly known as:  JANUVIA TAKE ONE (1) TABLET EACH DAY   valsartan 320 MG tablet Commonly known as:  DIOVAN TAKE ONE (1) TABLET EACH DAY   Vitamin D (Ergocalciferol) 50000 units Caps capsule Commonly known as:  DRISDOL TAKE 1 CAPSULE TWICE A WEEK   zolpidem 10 MG tablet Commonly known as:  AMBIEN Take 1 tablet (10 mg total) by mouth at bedtime as needed.          Objective:    BP 137/89   Pulse 87   Temp (!) 97 F (36.1 C) (Oral)   Ht 5\' 4"  (1.626 m)   Wt 208 lb (94.3 kg)   BMI 35.70 kg/m   Allergies  Allergen Reactions  . Codeine Nausea Only  . Amoxicillin Rash    Wt Readings from Last 3 Encounters:  05/16/18 208 lb (94.3 kg)  04/11/18 215 lb (97.5 kg)  04/03/18 215 lb (97.5 kg)    Physical Exam  Constitutional: She is oriented to person, place, and time. She appears well-developed and well-nourished.  HENT:  Head: Normocephalic and atraumatic.  Eyes: Pupils are equal, round, and reactive to light. Conjunctivae and EOM are normal.  Cardiovascular: Normal rate, regular rhythm, normal heart sounds and  intact distal pulses.  Pulmonary/Chest: Effort normal and breath sounds normal.  Abdominal: Soft. She exhibits no distension and no mass. Bowel sounds are increased. There is tenderness in the right upper quadrant, epigastric area and left upper quadrant. There is no rebound and no guarding.  Neurological: She is alert and oriented to person, place, and time. She has normal reflexes.  Skin: Skin is warm and dry. No rash noted.  Psychiatric: She has a normal mood and affect. Her behavior is normal. Judgment and  thought content normal.        Assessment & Plan:   1. Dysuria - Urine Culture - Urinalysis, Complete  2. Acute cystitis without hematuria - ciprofloxacin (CIPRO) 250 MG tablet; Take 1 tablet (250 mg total) by mouth 2 (two) times daily.  Dispense: 14 tablet; Refill: 0   Continue all other maintenance medications as listed above.  Follow up plan: No follow-ups on file.  Educational handout given for [place] PA-C [place] [address]  [place], [place] [address] [phone]   05/17/2018, 2:05 PM

## 2018-07-04 ENCOUNTER — Encounter: Payer: Self-pay | Admitting: Physician Assistant

## 2018-07-04 ENCOUNTER — Other Ambulatory Visit: Payer: Self-pay | Admitting: Physician Assistant

## 2018-07-04 ENCOUNTER — Ambulatory Visit (INDEPENDENT_AMBULATORY_CARE_PROVIDER_SITE_OTHER): Payer: Medicare HMO | Admitting: Physician Assistant

## 2018-07-04 VITALS — BP 124/72 | HR 65 | Temp 97.8°F | Ht 64.0 in | Wt 214.4 lb

## 2018-07-04 DIAGNOSIS — I10 Essential (primary) hypertension: Secondary | ICD-10-CM | POA: Diagnosis not present

## 2018-07-04 DIAGNOSIS — M5136 Other intervertebral disc degeneration, lumbar region: Secondary | ICD-10-CM

## 2018-07-04 DIAGNOSIS — E1165 Type 2 diabetes mellitus with hyperglycemia: Secondary | ICD-10-CM | POA: Diagnosis not present

## 2018-07-04 LAB — BAYER DCA HB A1C WAIVED: HB A1C (BAYER DCA - WAIVED): 6.8 % (ref <7.0)

## 2018-07-04 MED ORDER — HYDROCODONE-ACETAMINOPHEN 10-325 MG PO TABS: 1.0000 | ORAL_TABLET | Freq: Three times a day (TID) | ORAL | 0 refills | Status: DC | PRN

## 2018-07-04 MED ORDER — VITAMIN D (ERGOCALCIFEROL) 1.25 MG (50000 UNIT) PO CAPS: ORAL_CAPSULE | ORAL | 0 refills | Status: DC

## 2018-07-04 MED ORDER — HYDROCODONE-ACETAMINOPHEN 10-325 MG PO TABS: ORAL_TABLET | ORAL | 0 refills | Status: DC

## 2018-07-04 MED ORDER — TRAZODONE HCL 100 MG PO TABS: 100.0000 mg | ORAL_TABLET | Freq: Every day | ORAL | 0 refills | Status: DC

## 2018-07-04 NOTE — Progress Notes (Signed)
BP 124/72   Pulse 65   Temp 97.8 F (36.6 C) (Oral)   Ht 5' 4"  (1.626 m)   Wt 214 lb 6.4 oz (97.3 kg)   BMI 36.80 kg/m    Subjective:    Patient ID: Kara Nunez, female    DOB: 1950-11-22, 67 y.o.   MRN: [id number]  HPI: Kara Nunez is a 67 y.o. female presenting on 07/04/2018 for Hypertension (3 month follow up); Diabetes; and Insomnia  This patient comes in for periodic recheck on her chronic medical conditions which do include hypertension, diabetes, insomnia, degenerative disc disease, hypertension, hyperlipidemia.  She reports that the Ambien is not helping her sleep very much anymore.  At this time we will switch this out for trazodone.  She will stop the Ambien at this time.  We have gone over the narcotic contract with her there are no red flags.  A new contract is updated today.  She will be seen on an every three-month basis.  She does need labs performed today also.  She states that overall she is feeling very well  Past Medical History:  Diagnosis Date  . Allergic rhinitis   . Ascending aortic aneurysm ([place])    Followed by Dr. [name]  . Chronic back pain   . Coronary atherosclerosis of native coronary artery    Prior evaluation with in [facility] - details not clear   . Degenerative disc disease   . Essential hypertension, benign   . Hyperlipidemia   . Insomnia   . Type 2 diabetes mellitus (HCC)    Relevant past medical, surgical, family and social history reviewed and updated as indicated. Interim medical history since our last visit reviewed. Allergies and medications reviewed and updated. DATA REVIEWED: CHART IN EPIC  Family History reviewed for pertinent findings.  Review of Systems  Constitutional: Negative.  Negative for activity change, fatigue and fever.  HENT: Negative.   Eyes: Negative.   Respiratory: Negative.  Negative for cough.   Cardiovascular: Negative.  Negative for chest pain.  Gastrointestinal: Negative.  Negative for  abdominal pain.  Endocrine: Negative.   Genitourinary: Negative.  Negative for dysuria.  Musculoskeletal: Positive for arthralgias and back pain.  Skin: Negative.   Neurological: Negative.   Psychiatric/Behavioral: The patient is nervous/anxious.     Allergies as of 07/04/2018      Reactions   Codeine Nausea Only   Amoxicillin Rash      Medication List        Accurate as of 07/04/18  4:48 PM. Always use your most recent med list.          albuterol 108 (90 Base) MCG/ACT inhaler Commonly known as:  PROVENTIL HFA;VENTOLIN HFA USE 2 PUFFS EVERY 6 HOURS AS NEEDED   ALPRAZolam 0.5 MG tablet Commonly known as:  XANAX TAKE ONE TABLET 3 TIMES A DAY AS NEEDED.   aspirin EC 81 MG tablet Take 81 mg by mouth daily.   clobetasol cream 0.05 % Commonly known as:  TEMOVATE Apply 1 application topically 2 (two) times daily.   cloNIDine 0.2 MG tablet Commonly known as:  CATAPRES TAKE ONE (1) TABLET THREE (3) TIMES EACH DAY   donepezil 10 MG tablet Commonly known as:  ARICEPT Take 1 tablet (10 mg total) by mouth at bedtime.   DULoxetine 60 MG capsule Commonly known as:  CYMBALTA TAKE ONE (1) CAPSULE EACH DAY   Exenatide ER 2 MG/0.85ML Auij Inject 2 mg into the skin once a week.  fenofibrate micronized 134 MG capsule Commonly known as:  LOFIBRA TAKE 1 CAPSULE EVERY MORNING BEFORE BREAKFAST   FLUAD 0.5 ML Susy Generic drug:  Influenza Vac A&B Surf Ant Adj ADM 0.5ML IM UTD   fluorouracil 5 % cream Commonly known as:  EFUDEX Apply 1 application topically 2 (two) times daily as needed (Use on face).   furosemide 20 MG tablet Commonly known as:  LASIX TAKE ONE (1) TABLET EACH DAY   HYDROcodone-acetaminophen 10-325 MG tablet Commonly known as:  NORCO TAKE ONE TABLET EVERY 8 HOURS AS NEEDED   HYDROcodone-acetaminophen 10-325 MG tablet Commonly known as:  NORCO Take 1 tablet by mouth every 8 (eight) hours as needed.   HYDROcodone-acetaminophen 10-325 MG  tablet Commonly known as:  NORCO Take 1 tablet by mouth every 8 (eight) hours as needed.   IFEREX 150 150 MG capsule Generic drug:  iron polysaccharides TAKE ONE (1) CAPSULE EACH DAY   levothyroxine 50 MCG tablet Commonly known as:  SYNTHROID, LEVOTHROID TAKE ONE TABLET EACH MORNING BEFORE BREAKFAST   memantine 28 MG Cp24 24 hr capsule Commonly known as:  NAMENDA XR TAKE ONE (1) CAPSULE EACH DAY   metoprolol succinate 100 MG 24 hr tablet Commonly known as:  TOPROL-XL TAKE ONE (1) TABLET EACH DAY   NIFEdipine 90 MG 24 hr tablet Commonly known as:  PROCARDIA XL/NIFEDICAL-XL Take 90 mg by mouth daily.   NIFEdipine 60 MG 24 hr tablet Commonly known as:  PROCARDIA XL/NIFEDICAL XL Take 2 tablets (120 mg total) by mouth daily.   nitroGLYCERIN 0.1 mg/hr patch Commonly known as:  NITRODUR - Dosed in mg/24 hr APPLY 1 PATCH DAILY AS NEEDED   OLANZapine 5 MG tablet Commonly known as:  ZYPREXA Take 1 tablet (5 mg total) by mouth at bedtime.   pantoprazole 40 MG tablet Commonly known as:  PROTONIX Take 1 tablet (40 mg total) by mouth 2 (two) times daily.   potassium chloride 10 MEQ tablet Commonly known as:  K-DUR Take 1 tablet (10 mEq total) by mouth 2 (two) times daily.   rosuvastatin 20 MG tablet Commonly known as:  CRESTOR TAKE ONE (1) TABLET EACH DAY   sitaGLIPtin 100 MG tablet Commonly known as:  JANUVIA TAKE ONE (1) TABLET EACH DAY   traZODone 100 MG tablet Commonly known as:  DESYREL Take 1-2 tablets (100-200 mg total) by mouth at bedtime.   valsartan 320 MG tablet Commonly known as:  DIOVAN TAKE ONE (1) TABLET EACH DAY   Vitamin D (Ergocalciferol) 50000 units Caps capsule Commonly known as:  DRISDOL TAKE 1 CAPSULE TWICE A WEEK          Objective:    BP 124/72   Pulse 65   Temp 97.8 F (36.6 C) (Oral)   Ht 5' 4"  (1.626 m)   Wt 214 lb 6.4 oz (97.3 kg)   BMI 36.80 kg/m   Allergies  Allergen Reactions  . Codeine Nausea Only  . Amoxicillin Rash     Wt Readings from Last 3 Encounters:  07/04/18 214 lb 6.4 oz (97.3 kg)  05/16/18 208 lb (94.3 kg)  04/11/18 215 lb (97.5 kg)    Physical Exam  Constitutional: She is oriented to person, place, and time. She appears well-developed and well-nourished.  HENT:  Head: Normocephalic and atraumatic.  Eyes: Pupils are equal, round, and reactive to light. Conjunctivae and EOM are normal.  Cardiovascular: Normal rate, regular rhythm, normal heart sounds and intact distal pulses.  Pulmonary/Chest: Effort normal and breath sounds  normal.  Abdominal: Soft. Bowel sounds are normal.  Neurological: She is alert and oriented to person, place, and time. She has normal reflexes.  Skin: Skin is warm and dry. No rash noted.  Psychiatric: She has a normal mood and affect. Her behavior is normal. Judgment and thought content normal.    Results for orders placed or performed in visit on 07/04/18  Bayer DCA Hb A1c Waived  Result Value Ref Range   HB A1C (BAYER DCA - WAIVED) 6.8 <7.0 %      Assessment & Plan:   1. DDD (degenerative disc disease), lumbar - HYDROcodone-acetaminophen (NORCO) 10-325 MG tablet; TAKE ONE TABLET EVERY 8 HOURS AS NEEDED  Dispense: 90 tablet; Refill: 0 - HYDROcodone-acetaminophen (NORCO) 10-325 MG tablet; Take 1 tablet by mouth every 8 (eight) hours as needed.  Dispense: 90 tablet; Refill: 0 - HYDROcodone-acetaminophen (NORCO) 10-325 MG tablet; Take 1 tablet by mouth every 8 (eight) hours as needed.  Dispense: 90 tablet; Refill: 0  2. Uncontrolled type 2 diabetes mellitus with hyperglycemia (HCC) - CBC with Differential/Platelet - CMP14+EGFR - Lipid panel - Bayer DCA Hb A1c Waived - TSH  3. Essential hypertension - CBC with Differential/Platelet - CMP14+EGFR - Lipid panel - Bayer DCA Hb A1c Waived - TSH   Continue all other maintenance medications as listed above.  Follow up plan: Return in about 3 months (around 10/04/2018).  Educational handout given for  [place] PA-C [place] [address]  [place], [place] [address] [phone]   07/04/2018, 4:48 PM

## 2018-07-05 LAB — CMP14+EGFR
ALT: 21 IU/L (ref 0–32)
AST: 23 IU/L (ref 0–40)
Albumin/Globulin Ratio: 1.6 (ref 1.2–2.2)
Albumin: 4.1 g/dL (ref 3.6–4.8)
Alkaline Phosphatase: 60 IU/L (ref 39–117)
BUN / CREAT RATIO: 14 (ref 12–28)
BUN: 27 mg/dL (ref 8–27)
Bilirubin Total: <0.2 mg/dL (ref 0.0–1.2)
CALCIUM: 9.8 mg/dL (ref 8.7–10.3)
CHLORIDE: 102 mmol/L (ref 96–106)
CO2: 20 mmol/L (ref 20–29)
Creatinine, Ser: 1.87 mg/dL — ABNORMAL HIGH (ref 0.57–1.00)
GFR calc Af Amer: 31 mL/min/{1.73_m2} — ABNORMAL LOW (ref >59)
GFR calc non Af Amer: 27 mL/min/{1.73_m2} — ABNORMAL LOW (ref >59)
GLUCOSE: 157 mg/dL — AB (ref 65–99)
Globulin, Total: 2.6 g/dL (ref 1.5–4.5)
Potassium: 4.4 mmol/L (ref 3.5–5.2)
SODIUM: 138 mmol/L (ref 134–144)
TOTAL PROTEIN: 6.7 g/dL (ref 6.0–8.5)

## 2018-07-05 LAB — CBC WITH DIFFERENTIAL/PLATELET
BASOS ABS: 0.1 10*3/uL (ref 0.0–0.2)
Basos: 1 %
EOS (ABSOLUTE): 0.3 10*3/uL (ref 0.0–0.4)
Eos: 4 %
Hematocrit: 39.9 % (ref 34.0–46.6)
Hemoglobin: 12.6 g/dL (ref 11.1–15.9)
IMMATURE GRANS (ABS): 0 10*3/uL (ref 0.0–0.1)
IMMATURE GRANULOCYTES: 1 %
LYMPHS: 25 %
Lymphocytes Absolute: 1.5 10*3/uL (ref 0.7–3.1)
MCH: 27.5 pg (ref 26.6–33.0)
MCHC: 31.6 g/dL (ref 31.5–35.7)
MCV: 87 fL (ref 79–97)
Monocytes Absolute: 0.6 10*3/uL (ref 0.1–0.9)
Monocytes: 10 %
NEUTROS PCT: 59 %
Neutrophils Absolute: 3.6 10*3/uL (ref 1.4–7.0)
PLATELETS: 299 10*3/uL (ref 150–450)
RBC: 4.58 x10E6/uL (ref 3.77–5.28)
RDW: 14.4 % (ref 12.3–15.4)
WBC: 6.1 10*3/uL (ref 3.4–10.8)

## 2018-07-05 LAB — LIPID PANEL
CHOLESTEROL TOTAL: 155 mg/dL (ref 100–199)
Chol/HDL Ratio: 3.9 ratio (ref 0.0–4.4)
HDL: 40 mg/dL (ref >39)
TRIGLYCERIDES: 414 mg/dL — AB (ref 0–149)

## 2018-07-05 LAB — TSH: TSH: 5.53 u[IU]/mL — ABNORMAL HIGH (ref 0.450–4.500)

## 2018-07-06 ENCOUNTER — Telehealth: Payer: Self-pay | Admitting: Physician Assistant

## 2018-07-06 NOTE — Telephone Encounter (Signed)
Pt aware of labs  

## 2018-08-04 ENCOUNTER — Other Ambulatory Visit: Payer: Self-pay | Admitting: Physician Assistant

## 2018-08-04 DIAGNOSIS — F419 Anxiety disorder, unspecified: Secondary | ICD-10-CM

## 2018-08-04 NOTE — Telephone Encounter (Signed)
Last seen 07/04/18  Glenard Haring

## 2018-08-09 ENCOUNTER — Other Ambulatory Visit: Payer: Self-pay | Admitting: *Deleted

## 2018-08-09 DIAGNOSIS — R195 Other fecal abnormalities: Secondary | ICD-10-CM

## 2018-08-14 ENCOUNTER — Telehealth: Payer: Self-pay | Admitting: Physician Assistant

## 2018-08-14 DIAGNOSIS — Z1211 Encounter for screening for malignant neoplasm of colon: Secondary | ICD-10-CM

## 2018-08-14 DIAGNOSIS — D509 Iron deficiency anemia, unspecified: Secondary | ICD-10-CM | POA: Diagnosis not present

## 2018-08-14 DIAGNOSIS — I10 Essential (primary) hypertension: Secondary | ICD-10-CM | POA: Diagnosis not present

## 2018-08-14 DIAGNOSIS — N183 Chronic kidney disease, stage 3 (moderate): Secondary | ICD-10-CM | POA: Diagnosis not present

## 2018-08-14 DIAGNOSIS — R809 Proteinuria, unspecified: Secondary | ICD-10-CM | POA: Diagnosis not present

## 2018-08-14 DIAGNOSIS — Z79899 Other long term (current) drug therapy: Secondary | ICD-10-CM | POA: Diagnosis not present

## 2018-08-14 DIAGNOSIS — E559 Vitamin D deficiency, unspecified: Secondary | ICD-10-CM | POA: Diagnosis not present

## 2018-08-14 NOTE — Telephone Encounter (Signed)
Pt requested referral to GI for colonoscopy. Referral placed and pt aware.

## 2018-08-16 DIAGNOSIS — R809 Proteinuria, unspecified: Secondary | ICD-10-CM | POA: Diagnosis not present

## 2018-08-16 DIAGNOSIS — N184 Chronic kidney disease, stage 4 (severe): Secondary | ICD-10-CM | POA: Diagnosis not present

## 2018-08-16 DIAGNOSIS — E872 Acidosis: Secondary | ICD-10-CM | POA: Diagnosis not present

## 2018-08-16 DIAGNOSIS — E669 Obesity, unspecified: Secondary | ICD-10-CM | POA: Diagnosis not present

## 2018-08-17 DIAGNOSIS — R195 Other fecal abnormalities: Secondary | ICD-10-CM | POA: Diagnosis not present

## 2018-08-17 DIAGNOSIS — R194 Change in bowel habit: Secondary | ICD-10-CM | POA: Diagnosis not present

## 2018-08-21 DIAGNOSIS — K649 Unspecified hemorrhoids: Secondary | ICD-10-CM | POA: Diagnosis not present

## 2018-08-21 DIAGNOSIS — D123 Benign neoplasm of transverse colon: Secondary | ICD-10-CM | POA: Diagnosis not present

## 2018-08-21 DIAGNOSIS — R195 Other fecal abnormalities: Secondary | ICD-10-CM | POA: Diagnosis not present

## 2018-08-21 DIAGNOSIS — K6389 Other specified diseases of intestine: Secondary | ICD-10-CM | POA: Diagnosis not present

## 2018-08-21 DIAGNOSIS — D122 Benign neoplasm of ascending colon: Secondary | ICD-10-CM | POA: Diagnosis not present

## 2018-08-21 DIAGNOSIS — D120 Benign neoplasm of cecum: Secondary | ICD-10-CM | POA: Diagnosis not present

## 2018-08-21 DIAGNOSIS — K5730 Diverticulosis of large intestine without perforation or abscess without bleeding: Secondary | ICD-10-CM | POA: Diagnosis not present

## 2018-08-23 ENCOUNTER — Other Ambulatory Visit: Payer: Self-pay | Admitting: Thoracic Surgery (Cardiothoracic Vascular Surgery)

## 2018-08-23 DIAGNOSIS — I712 Thoracic aortic aneurysm, without rupture, unspecified: Secondary | ICD-10-CM

## 2018-09-27 ENCOUNTER — Telehealth: Payer: Self-pay | Admitting: Physician Assistant

## 2018-09-27 ENCOUNTER — Other Ambulatory Visit: Payer: Self-pay | Admitting: Physician Assistant

## 2018-09-27 MED ORDER — NIFEDIPINE ER OSMOTIC RELEASE 90 MG PO TB24: 90.0000 mg | ORAL_TABLET | Freq: Every day | ORAL | 2 refills | Status: DC

## 2018-09-27 NOTE — Telephone Encounter (Signed)
What is the name of the medication? Nifedipien 90 mg Needs to ASAP  Have you contacted your pharmacy to request a refill? YES  Which pharmacy would you like this sent to? [facility]   Patient notified that their request is being sent to the clinical staff for review and that they should receive a call once it is complete. If they do not receive a call within 24 hours they can check with their pharmacy or our office.

## 2018-09-27 NOTE — Telephone Encounter (Signed)
Patient is going to call [facility] and see what medication they were referring to.

## 2018-10-06 ENCOUNTER — Ambulatory Visit
Admission: RE | Admit: 2018-10-06 | Discharge: 2018-10-06 | Disposition: A | Discharge status: Home / Self Care | Payer: Medicare HMO | Source: Ambulatory Visit | Attending: Thoracic Surgery (Cardiothoracic Vascular Surgery) | Admitting: Thoracic Surgery (Cardiothoracic Vascular Surgery)

## 2018-10-06 ENCOUNTER — Ambulatory Visit: Payer: Medicare HMO | Admitting: Physician Assistant

## 2018-10-06 ENCOUNTER — Other Ambulatory Visit: Payer: Self-pay | Admitting: Thoracic Surgery (Cardiothoracic Vascular Surgery)

## 2018-10-06 DIAGNOSIS — I712 Thoracic aortic aneurysm, without rupture, unspecified: Secondary | ICD-10-CM

## 2018-10-10 ENCOUNTER — Encounter: Payer: Self-pay | Admitting: Thoracic Surgery (Cardiothoracic Vascular Surgery)

## 2018-10-10 ENCOUNTER — Ambulatory Visit (INDEPENDENT_AMBULATORY_CARE_PROVIDER_SITE_OTHER): Payer: Medicare HMO | Admitting: Thoracic Surgery (Cardiothoracic Vascular Surgery)

## 2018-10-10 ENCOUNTER — Other Ambulatory Visit: Payer: Self-pay

## 2018-10-10 VITALS — BP 142/86 | HR 70 | Resp 18 | Ht 64.0 in | Wt 222.0 lb

## 2018-10-10 DIAGNOSIS — I712 Thoracic aortic aneurysm, without rupture: Secondary | ICD-10-CM | POA: Diagnosis not present

## 2018-10-10 DIAGNOSIS — I7121 Aneurysm of the ascending aorta, without rupture: Secondary | ICD-10-CM

## 2018-10-10 NOTE — Progress Notes (Signed)
[address]       [place] [address]             [phone]     HPI: Mrs. Araque returns for a scheduled follow-up visit  Geanine Minervini is a 68 year old woman with a past history of a 4.5 cm ascending aneurysm, thoracic aortic atherosclerosis, coronary atherosclerosis, hypertension, hyperlipidemia, degenerative disc disease, chronic back pain, and type 2 diabetes mellitus without complications.  Have been following her since 2012 when she was first found to have a 4.4 cm ascending aneurysm.  There has been minimal change in that time.  She says she is doing well.  She continues to swim about 4 times a week.  She had previously cut salt and sugar out of her diet but said she did use a little salt recently.  She tracks her blood pressure at home and says normally her systolic is 789 or less.  Diastolics tend to be in the 53s.  Past Medical History:  Diagnosis Date  . Allergic rhinitis   . Ascending aortic aneurysm ([address])    Followed by Dr. [name]  . Chronic back pain   . Coronary atherosclerosis of native coronary artery    Prior evaluation with in [facility] - details not clear   . Degenerative disc disease   . Essential hypertension, benign   . Hyperlipidemia   . Insomnia   . Type 2 diabetes mellitus ([place])     Current Outpatient Medications  Medication Sig Dispense Refill  . albuterol (VENTOLIN HFA) 108 (90 Base) MCG/ACT inhaler USE 2 PUFFS EVERY 6 HOURS AS NEEDED 54 g 3  . ALPRAZolam (XANAX) 0.5 MG tablet TAKE ONE TABLET 3 TIMES A DAY AS NEEDED. 270 tablet 0  . aspirin EC 81 MG tablet Take 81 mg by mouth daily.    . clobetasol cream (TEMOVATE) 3.81 % Apply 1 application topically 2 (two) times daily. 60 g 2  . cloNIDine (CATAPRES) 0.2 MG tablet TAKE ONE (1) TABLET THREE (3) TIMES EACH DAY 270 tablet 1  . donepezil (ARICEPT) 10 MG tablet Take 1 tablet (10 mg total) by mouth at bedtime. 90 tablet 1  . DULoxetine (CYMBALTA) 60 MG capsule TAKE ONE (1)  CAPSULE EACH DAY 90 capsule 0  . Exenatide ER (BYDUREON BCISE) 2 MG/0.85ML AUIJ Inject 2 mg into the skin once a week. 4 pen 3  . fenofibrate micronized (LOFIBRA) 134 MG capsule TAKE 1 CAPSULE EVERY MORNING BEFORE BREAKFAST 90 capsule 1  . FLUAD 0.5 ML SUSY ADM 0.5ML IM UTD  0  . fluorouracil (EFUDEX) 5 % cream Apply 1 application topically 2 (two) times daily as needed (Use on face).    . furosemide (LASIX) 20 MG tablet TAKE ONE (1) TABLET EACH DAY 90 tablet 1  . HYDROcodone-acetaminophen (NORCO) 10-325 MG tablet TAKE ONE TABLET EVERY 8 HOURS AS NEEDED 90 tablet 0  . HYDROcodone-acetaminophen (NORCO) 10-325 MG tablet Take 1 tablet by mouth every 8 (eight) hours as needed. 90 tablet 0  . HYDROcodone-acetaminophen (NORCO) 10-325 MG tablet Take 1 tablet by mouth every 8 (eight) hours as needed. 90 tablet 0  . IFEREX 150 150 MG capsule TAKE ONE (1) CAPSULE EACH DAY 90 capsule 3  . levothyroxine (SYNTHROID, LEVOTHROID) 50 MCG tablet TAKE ONE TABLET EACH MORNING BEFORE BREAKFAST 90 tablet 3  . memantine (NAMENDA XR) 28 MG CP24 24 hr capsule TAKE ONE (1) CAPSULE EACH DAY 90 capsule 1  . metoprolol succinate (TOPROL-XL)  100 MG 24 hr tablet TAKE ONE (1) TABLET EACH DAY 90 tablet 1  . NIFEdipine (PROCARDIA XL/ADALAT-CC) 60 MG 24 hr tablet Take 2 tablets (120 mg total) by mouth daily. 180 tablet 1  . NIFEdipine (PROCARDIA XL/NIFEDICAL-XL) 90 MG 24 hr tablet Take 1 tablet (90 mg total) by mouth daily. 90 tablet 2  . nitroGLYCERIN (NITRODUR - DOSED IN MG/24 HR) 0.1 mg/hr patch APPLY 1 PATCH DAILY AS NEEDED 90 patch 0  . OLANZapine (ZYPREXA) 5 MG tablet Take 1 tablet (5 mg total) by mouth at bedtime. 90 tablet 1  . pantoprazole (PROTONIX) 40 MG tablet Take 1 tablet (40 mg total) by mouth 2 (two) times daily. 180 tablet 1  . potassium chloride (K-DUR) 10 MEQ tablet Take 1 tablet (10 mEq total) by mouth 2 (two) times daily. 180 tablet 1  . rosuvastatin (CRESTOR) 20 MG tablet TAKE ONE (1) TABLET EACH DAY 90  tablet 1  . sitaGLIPtin (JANUVIA) 100 MG tablet TAKE ONE (1) TABLET EACH DAY 90 tablet 0  . spironolactone (ALDACTONE) 25 MG tablet Take 1 tablet by mouth daily. 90 tablet 1  . traZODone (DESYREL) 100 MG tablet Take 1-2 tablets (100-200 mg total) by mouth at bedtime. 60 tablet 0  . valsartan (DIOVAN) 320 MG tablet TAKE ONE (1) TABLET EACH DAY 90 tablet 1  . Vitamin D, Ergocalciferol, (DRISDOL) 50000 units CAPS capsule TAKE 1 CAPSULE TWICE A WEEK 24 capsule 0  . zolpidem (AMBIEN) 10 MG tablet Take 1 tablet (10 mg total) by mouth at bedtime as needed. 90 tablet 0   No current facility-administered medications for this visit.     Physical Exam BP (!) 142/86 (BP Location: Left Arm, Patient Position: Sitting, Cuff Size: Large)   Pulse 70   Resp 18   Ht 5\' 4"  (1.626 m)   Wt 222 lb (100.7 kg)   SpO2 94% Comment: RA  BMI 38.71 kg/m  68 year old woman in no acute distress Alert and oriented x3 with no focal deficits Lungs clear with equal breath sounds bilaterally Cardiac regular rate and rhythm, normal S1 and S2, no murmur Pulses intact No edema  Diagnostic Tests: MRA CHEST WITHOUT CONTRAST  TECHNIQUE: Angiographic images of the chest were obtained using MRA technique without intravenous contrast.  Creatinine was obtained on site at [facility] at [address]  Results: Creatinine 2.1 mg/dL.  CONTRAST:  None  COMPARISON:  04/11/2018  FINDINGS: Aorta: Ascending thoracic aorta is aneurysmal measuring up to 4.5 cm and stable. Proximal aortic arch measures up to 3.4 cm and minimally changed. Proximal descending thoracic aorta measures 3.0 cm and stable. No evidence for a dissection.  Heart: Heart size is normal without significant pericardial fluid.  Pulmonary Arteries: Main pulmonary artery is prominent for size measuring 3.9 cm and stable.  Other: No large pleural effusions. Limited evaluation of the lungs on this examination. No significant chest  or axillary lymphadenopathy. Limited evaluation of the abdominal structures. No gross bone abnormality.  IMPRESSION: Stable fusiform aneurysm of the ascending thoracic aorta, measuring up to 4.5 cm. Aortic aneurysm NOS (ICD10-I71.9).   Electronically Signed   By: Markus Daft M.D.   On: 10/06/2018 10:20 I personally reviewed the MR images and concur with the findings noted above  Impression: Enyla Lisbon is a 68 year old woman with a history of an ascending aortic aneurysm, thoracic aortic atherosclerosis, coronary atherosclerosis, hypertension, hyperlipidemia, and type 2 diabetes.  Ascending aneurysm-stable at 4.5 cm.  Needs continued semiannual follow-up.  Hypertension-blood  pressure mildly elevated today with a systolic of 898.  She checks it regularly at home and it has been running lower than that.  Is much lower than it was a year ago when her systolic was 421.  The slight elevation today may be due to some recent salt intake and or some component of whitecoat hypertension.  I encouraged her to continue to keep a close eye on this at home and check her blood pressure on a regular basis.  Obesity-exercise is limited by back pain but she has been swimming on a regular basis.  Her weight is down about 14 pounds from a year ago.  Plan: Return in 6 months with MR angiogram of chest  Melrose Nakayama, MD [facility] [phone]

## 2018-10-11 ENCOUNTER — Encounter: Payer: Self-pay | Admitting: Physician Assistant

## 2018-10-11 ENCOUNTER — Ambulatory Visit (INDEPENDENT_AMBULATORY_CARE_PROVIDER_SITE_OTHER): Payer: 59 | Admitting: Physician Assistant

## 2018-10-11 VITALS — BP 133/73 | HR 70 | Temp 97.2°F | Ht 64.0 in | Wt 219.8 lb

## 2018-10-11 DIAGNOSIS — H10023 Other mucopurulent conjunctivitis, bilateral: Secondary | ICD-10-CM | POA: Diagnosis not present

## 2018-10-11 DIAGNOSIS — E782 Mixed hyperlipidemia: Secondary | ICD-10-CM

## 2018-10-11 DIAGNOSIS — M5136 Other intervertebral disc degeneration, lumbar region: Secondary | ICD-10-CM | POA: Diagnosis not present

## 2018-10-11 DIAGNOSIS — E039 Hypothyroidism, unspecified: Secondary | ICD-10-CM

## 2018-10-11 DIAGNOSIS — I10 Essential (primary) hypertension: Secondary | ICD-10-CM

## 2018-10-11 DIAGNOSIS — E1165 Type 2 diabetes mellitus with hyperglycemia: Secondary | ICD-10-CM

## 2018-10-11 LAB — BAYER DCA HB A1C WAIVED: HB A1C: 6.9 % (ref <7.0)

## 2018-10-11 MED ORDER — HYDROCODONE-ACETAMINOPHEN 10-325 MG PO TABS: 1.0000 | ORAL_TABLET | Freq: Three times a day (TID) | ORAL | 0 refills | Status: DC | PRN

## 2018-10-11 MED ORDER — HYDROCODONE-ACETAMINOPHEN 10-325 MG PO TABS: ORAL_TABLET | ORAL | 0 refills | Status: DC

## 2018-10-11 MED ORDER — ERYTHROMYCIN 5 MG/GM OP OINT: 1.0000 "application " | TOPICAL_OINTMENT | Freq: Three times a day (TID) | OPHTHALMIC | 0 refills | Status: DC

## 2018-10-12 LAB — CBC WITH DIFFERENTIAL/PLATELET
BASOS ABS: 0.1 10*3/uL (ref 0.0–0.2)
Basos: 1 %
EOS (ABSOLUTE): 0.3 10*3/uL (ref 0.0–0.4)
Eos: 4 %
Hematocrit: 42 % (ref 34.0–46.6)
Hemoglobin: 13.6 g/dL (ref 11.1–15.9)
Immature Grans (Abs): 0 10*3/uL (ref 0.0–0.1)
Immature Granulocytes: 0 %
LYMPHS ABS: 2 10*3/uL (ref 0.7–3.1)
Lymphs: 29 %
MCH: 28.2 pg (ref 26.6–33.0)
MCHC: 32.4 g/dL (ref 31.5–35.7)
MCV: 87 fL (ref 79–97)
MONOS ABS: 0.6 10*3/uL (ref 0.1–0.9)
Monocytes: 8 %
NEUTROS ABS: 4.1 10*3/uL (ref 1.4–7.0)
Neutrophils: 58 %
Platelets: 301 10*3/uL (ref 150–450)
RBC: 4.83 x10E6/uL (ref 3.77–5.28)
RDW: 13.6 % (ref 11.7–15.4)
WBC: 7 10*3/uL (ref 3.4–10.8)

## 2018-10-12 LAB — CMP14+EGFR
A/G RATIO: 1.6 (ref 1.2–2.2)
ALK PHOS: 71 IU/L (ref 39–117)
ALT: 29 IU/L (ref 0–32)
AST: 23 IU/L (ref 0–40)
Albumin: 4.1 g/dL (ref 3.8–4.8)
BUN / CREAT RATIO: 12 (ref 12–28)
BUN: 22 mg/dL (ref 8–27)
CHLORIDE: 103 mmol/L (ref 96–106)
CO2: 20 mmol/L (ref 20–29)
Calcium: 10.2 mg/dL (ref 8.7–10.3)
Creatinine, Ser: 1.77 mg/dL — ABNORMAL HIGH (ref 0.57–1.00)
GFR calc Af Amer: 33 mL/min/{1.73_m2} — ABNORMAL LOW (ref >59)
GFR calc non Af Amer: 29 mL/min/{1.73_m2} — ABNORMAL LOW (ref >59)
GLUCOSE: 159 mg/dL — AB (ref 65–99)
Globulin, Total: 2.6 g/dL (ref 1.5–4.5)
Potassium: 4.1 mmol/L (ref 3.5–5.2)
Sodium: 140 mmol/L (ref 134–144)
Total Protein: 6.7 g/dL (ref 6.0–8.5)

## 2018-10-12 LAB — LIPID PANEL
CHOL/HDL RATIO: 3 ratio (ref 0.0–4.4)
Cholesterol, Total: 159 mg/dL (ref 100–199)
HDL: 53 mg/dL (ref >39)
LDL CALC: 44 mg/dL (ref 0–99)
TRIGLYCERIDES: 308 mg/dL — AB (ref 0–149)
VLDL CHOLESTEROL CAL: 62 mg/dL — AB (ref 5–40)

## 2018-10-12 LAB — TSH: TSH: 4.38 u[IU]/mL (ref 0.450–4.500)

## 2018-10-15 NOTE — Progress Notes (Signed)
BP 133/73   Pulse 70   Temp (!) 97.2 F (36.2 C) (Oral)   Ht 5' 4"  (1.626 m)   Wt 219 lb 12.8 oz (99.7 kg)   BMI 37.73 kg/m    Subjective:    Patient ID: Kara Nunez, female    DOB: 12-25-1950, 68 y.o.   MRN: [id number]  HPI: Kara Nunez is a 68 y.o. female presenting on 10/11/2018 for Diabetes; Hypertension; Hyperlipidemia; and Conjunctivitis  This patient comes in for periodic recheck on medications and conditions including's, type 2 diabetes, hypertension, hyperlipidemia, hypothyroidism.  She also has pinkeye both eyes that is been bothering her for couple of days.  She does medication.  There are no red flags in the registry concerning refill of her pain medication.  Refills will be sent..   All medications are reviewed today. There are no reports of any problems with the medications. All of the medical conditions are reviewed and updated.  Lab work is reviewed and will be ordered as medically necessary. There are no new problems reported with today's visit.   Past Medical History:  Diagnosis Date  . Allergic rhinitis   . Ascending aortic aneurysm ([place])    Followed by Dr. Roxan Nunez  . Chronic back pain   . Coronary atherosclerosis of native coronary artery    Prior evaluation with in [facility] - details not clear   . Degenerative disc disease   . Essential hypertension, benign   . Hyperlipidemia   . Insomnia   . Type 2 diabetes mellitus (H[facility])    Relevant past medical, surgical, family and social history reviewed and updated as indicated. Interim medical history since our last visit reviewed. Allergies and medications reviewed and updated. DATA REVIEWED: CHART IN EPIC  Family History reviewed for pertinent findings.  Review of Systems  Constitutional: Negative.   HENT: Negative.   Eyes: Positive for discharge and redness.  Respiratory: Negative.   Gastrointestinal: Negative.   Genitourinary: Negative.   Musculoskeletal: Positive for arthralgias and back  pain.    Allergies as of 10/11/2018      Reactions   Codeine Nausea Only   Amoxicillin Rash      Medication List       Accurate as of October 11, 2018 11:59 PM. Always use your most recent med list.        albuterol 108 (90 Base) MCG/ACT inhaler Commonly known as:  VENTOLIN HFA USE 2 PUFFS EVERY 6 HOURS AS NEEDED   ALPRAZolam 0.5 MG tablet Commonly known as:  XANAX TAKE ONE TABLET 3 TIMES A DAY AS NEEDED.   aspirin EC 81 MG tablet Take 81 mg by mouth daily.   clobetasol cream 0.05 % Commonly known as:  TEMOVATE Apply 1 application topically 2 (two) times daily.   cloNIDine 0.2 MG tablet Commonly known as:  CATAPRES TAKE ONE (1) TABLET THREE (3) TIMES EACH DAY   donepezil 10 MG tablet Commonly known as:  ARICEPT Take 1 tablet (10 mg total) by mouth at bedtime.   DULoxetine 60 MG capsule Commonly known as:  CYMBALTA TAKE ONE (1) CAPSULE EACH DAY   erythromycin ophthalmic ointment Place 1 application into both eyes 3 (three) times daily.   Exenatide ER 2 MG/0.85ML Auij Commonly known as:  BYDUREON BCISE Inject 2 mg into the skin once a week.   fenofibrate micronized 134 MG capsule Commonly known as:  LOFIBRA TAKE 1 CAPSULE EVERY MORNING BEFORE BREAKFAST   FLUAD 0.5 ML Susy Generic drug:  Influenza Vac A&B Surf Ant Adj ADM 0.5ML IM UTD   fluorouracil 5 % cream Commonly known as:  EFUDEX Apply 1 application topically 2 (two) times daily as needed (Use on face).   furosemide 20 MG tablet Commonly known as:  LASIX TAKE ONE (1) TABLET EACH DAY   HYDROcodone-acetaminophen 10-325 MG tablet Commonly known as:  NORCO TAKE ONE TABLET EVERY 8 HOURS AS NEEDED   HYDROcodone-acetaminophen 10-325 MG tablet Commonly known as:  NORCO Take 1 tablet by mouth every 8 (eight) hours as needed.   HYDROcodone-acetaminophen 10-325 MG tablet Commonly known as:  NORCO Take 1 tablet by mouth every 8 (eight) hours as needed.   IFEREX 150 150 MG capsule Generic drug:   iron polysaccharides TAKE ONE (1) CAPSULE EACH DAY   levothyroxine 50 MCG tablet Commonly known as:  SYNTHROID, LEVOTHROID TAKE ONE TABLET EACH MORNING BEFORE BREAKFAST   memantine 28 MG Cp24 24 hr capsule Commonly known as:  NAMENDA XR TAKE ONE (1) CAPSULE EACH DAY   metoprolol succinate 100 MG 24 hr tablet Commonly known as:  TOPROL-XL TAKE ONE (1) TABLET EACH DAY   NIFEdipine 60 MG 24 hr tablet Commonly known as:  PROCARDIA XL/NIFEDICAL XL Take 2 tablets (120 mg total) by mouth daily.   NIFEdipine 90 MG 24 hr tablet Commonly known as:  PROCARDIA XL/NIFEDICAL-XL Take 1 tablet (90 mg total) by mouth daily.   nitroGLYCERIN 0.1 mg/hr patch Commonly known as:  NITRODUR - Dosed in mg/24 hr APPLY 1 PATCH DAILY AS NEEDED   OLANZapine 5 MG tablet Commonly known as:  ZYPREXA Take 1 tablet (5 mg total) by mouth at bedtime.   pantoprazole 40 MG tablet Commonly known as:  PROTONIX Take 1 tablet (40 mg total) by mouth 2 (two) times daily.   potassium chloride 10 MEQ tablet Commonly known as:  K-DUR Take 1 tablet (10 mEq total) by mouth 2 (two) times daily.   rosuvastatin 20 MG tablet Commonly known as:  CRESTOR TAKE ONE (1) TABLET EACH DAY   sitaGLIPtin 100 MG tablet Commonly known as:  JANUVIA TAKE ONE (1) TABLET EACH DAY   spironolactone 25 MG tablet Commonly known as:  ALDACTONE Take 1 tablet by mouth daily.   traZODone 100 MG tablet Commonly known as:  DESYREL Take 1-2 tablets (100-200 mg total) by mouth at bedtime.   valsartan 320 MG tablet Commonly known as:  DIOVAN TAKE ONE (1) TABLET EACH DAY   Vitamin D (Ergocalciferol) 1.25 MG (50000 UT) Caps capsule Commonly known as:  DRISDOL TAKE 1 CAPSULE TWICE A WEEK   zolpidem 10 MG tablet Commonly known as:  AMBIEN Take 1 tablet (10 mg total) by mouth at bedtime as needed.          Objective:    BP 133/73   Pulse 70   Temp (!) 97.2 F (36.2 C) (Oral)   Ht 5' 4"  (1.626 m)   Wt 219 lb 12.8 oz (99.7 kg)    BMI 37.73 kg/m   Allergies  Allergen Reactions  . Codeine Nausea Only  . Amoxicillin Rash    Wt Readings from Last 3 Encounters:  10/11/18 219 lb 12.8 oz (99.7 kg)  10/10/18 222 lb (100.7 kg)  07/04/18 214 lb 6.4 oz (97.3 kg)    Physical Exam Constitutional:      Appearance: She is well-developed.  HENT:     Head: Normocephalic and atraumatic.  Eyes:     Conjunctiva/sclera:     Right eye: Right conjunctiva  is injected.     Left eye: Left conjunctiva is injected.     Pupils: Pupils are equal, round, and reactive to light.  Cardiovascular:     Rate and Rhythm: Normal rate and regular rhythm.     Heart sounds: Normal heart sounds.  Pulmonary:     Effort: Pulmonary effort is normal.     Breath sounds: Normal breath sounds.  Abdominal:     General: Bowel sounds are normal.     Palpations: Abdomen is soft.  Skin:    General: Skin is warm and dry.     Findings: No rash.  Neurological:     Mental Status: She is alert and oriented to person, place, and time.     Deep Tendon Reflexes: Reflexes are normal and symmetric.  Psychiatric:        Behavior: Behavior normal.        Thought Content: Thought content normal.        Judgment: Judgment normal.     Results for orders placed or performed in visit on 10/11/18  Lipid panel  Result Value Ref Range   Cholesterol, Total 159 100 - 199 mg/dL   Triglycerides 308 (H) 0 - 149 mg/dL   HDL 53 >39 mg/dL   VLDL Cholesterol Cal 62 (H) 5 - 40 mg/dL   LDL Calculated 44 0 - 99 mg/dL   Chol/HDL Ratio 3.0 0.0 - 4.4 ratio  CBC with Differential/Platelet  Result Value Ref Range   WBC 7.0 3.4 - 10.8 x10E3/uL   RBC 4.83 3.77 - 5.28 x10E6/uL   Hemoglobin 13.6 11.1 - 15.9 g/dL   Hematocrit 42.0 34.0 - 46.6 %   MCV 87 79 - 97 fL   MCH 28.2 26.6 - 33.0 pg   MCHC 32.4 31.5 - 35.7 g/dL   RDW 13.6 11.7 - 15.4 %   Platelets 301 150 - 450 x10E3/uL   Neutrophils 58 Not Estab. %   Lymphs 29 Not Estab. %   Monocytes 8 Not Estab. %   Eos 4  Not Estab. %   Basos 1 Not Estab. %   Neutrophils Absolute 4.1 1.4 - 7.0 x10E3/uL   Lymphocytes Absolute 2.0 0.7 - 3.1 x10E3/uL   Monocytes Absolute 0.6 0.1 - 0.9 x10E3/uL   EOS (ABSOLUTE) 0.3 0.0 - 0.4 x10E3/uL   Basophils Absolute 0.1 0.0 - 0.2 x10E3/uL   Immature Granulocytes 0 Not Estab. %   Immature Grans (Abs) 0.0 0.0 - 0.1 x10E3/uL  CMP14+EGFR  Result Value Ref Range   Glucose 159 (H) 65 - 99 mg/dL   BUN 22 8 - 27 mg/dL   Creatinine, Ser 1.77 (H) 0.57 - 1.00 mg/dL   GFR calc non Af Amer 29 (L) >59 mL/min/1.73   GFR calc Af Amer 33 (L) >59 mL/min/1.73   BUN/Creatinine Ratio 12 12 - 28   Sodium 140 134 - 144 mmol/L   Potassium 4.1 3.5 - 5.2 mmol/L   Chloride 103 96 - 106 mmol/L   CO2 20 20 - 29 mmol/L   Calcium 10.2 8.7 - 10.3 mg/dL   Total Protein 6.7 6.0 - 8.5 g/dL   Albumin 4.1 3.8 - 4.8 g/dL   Globulin, Total 2.6 1.5 - 4.5 g/dL   Albumin/Globulin Ratio 1.6 1.2 - 2.2   Bilirubin Total <0.2 0.0 - 1.2 mg/dL   Alkaline Phosphatase 71 39 - 117 IU/L   AST 23 0 - 40 IU/L   ALT 29 0 - 32 IU/L  Bayer DCA Hb A1c  Waived  Result Value Ref Range   HB A1C (BAYER DCA - WAIVED) 6.9 <7.0 %  TSH  Result Value Ref Range   TSH 4.380 0.450 - 4.500 uIU/mL      Assessment & Plan:   1. DDD (degenerative disc disease), lumbar - HYDROcodone-acetaminophen (NORCO) 10-325 MG tablet; TAKE ONE TABLET EVERY 8 HOURS AS NEEDED  Dispense: 90 tablet; Refill: 0 - HYDROcodone-acetaminophen (NORCO) 10-325 MG tablet; Take 1 tablet by mouth every 8 (eight) hours as needed.  Dispense: 90 tablet; Refill: 0 - HYDROcodone-acetaminophen (NORCO) 10-325 MG tablet; Take 1 tablet by mouth every 8 (eight) hours as needed.  Dispense: 90 tablet; Refill: 0  2. Uncontrolled type 2 diabetes mellitus with hyperglycemia (HCC) - Bayer DCA Hb A1c Waived  3. Essential hypertension - CBC with Differential/Platelet - CMP14+EGFR  4. Mixed hyperlipidemia - Lipid panel  5. Hypothyroidism, unspecified type - TSH  6.  Pink eye disease of both eyes - erythromycin ophthalmic ointment; Place 1 application into both eyes 3 (three) times daily.  Dispense: 3.5 g; Refill: 0   Continue all other maintenance medications as listed above.  Follow up plan: Return in about 3 months (around 01/10/2019) for recheck.  Educational handout given for [place] PA-C [facility] [address]  [place], [place] [address] [phone]   10/15/2018, 6:58 PM

## 2018-10-17 DIAGNOSIS — Z1283 Encounter for screening for malignant neoplasm of skin: Secondary | ICD-10-CM | POA: Diagnosis not present

## 2018-10-17 DIAGNOSIS — Z79899 Other long term (current) drug therapy: Secondary | ICD-10-CM | POA: Diagnosis not present

## 2018-10-17 DIAGNOSIS — L570 Actinic keratosis: Secondary | ICD-10-CM | POA: Diagnosis not present

## 2018-10-17 DIAGNOSIS — L821 Other seborrheic keratosis: Secondary | ICD-10-CM | POA: Diagnosis not present

## 2018-10-17 DIAGNOSIS — L219 Seborrheic dermatitis, unspecified: Secondary | ICD-10-CM | POA: Diagnosis not present

## 2018-10-17 DIAGNOSIS — I781 Nevus, non-neoplastic: Secondary | ICD-10-CM | POA: Diagnosis not present

## 2018-10-17 DIAGNOSIS — L309 Dermatitis, unspecified: Secondary | ICD-10-CM | POA: Diagnosis not present

## 2018-10-17 DIAGNOSIS — D229 Melanocytic nevi, unspecified: Secondary | ICD-10-CM | POA: Diagnosis not present

## 2018-10-30 DIAGNOSIS — E559 Vitamin D deficiency, unspecified: Secondary | ICD-10-CM | POA: Diagnosis not present

## 2018-10-30 DIAGNOSIS — I10 Essential (primary) hypertension: Secondary | ICD-10-CM | POA: Diagnosis not present

## 2018-10-30 DIAGNOSIS — D509 Iron deficiency anemia, unspecified: Secondary | ICD-10-CM | POA: Diagnosis not present

## 2018-10-30 DIAGNOSIS — R809 Proteinuria, unspecified: Secondary | ICD-10-CM | POA: Diagnosis not present

## 2018-10-30 DIAGNOSIS — Z79899 Other long term (current) drug therapy: Secondary | ICD-10-CM | POA: Diagnosis not present

## 2018-10-30 DIAGNOSIS — N183 Chronic kidney disease, stage 3 (moderate): Secondary | ICD-10-CM | POA: Diagnosis not present

## 2018-10-30 DIAGNOSIS — Z1159 Encounter for screening for other viral diseases: Secondary | ICD-10-CM | POA: Diagnosis not present

## 2018-11-01 DIAGNOSIS — E8889 Other specified metabolic disorders: Secondary | ICD-10-CM | POA: Diagnosis not present

## 2018-11-01 DIAGNOSIS — R809 Proteinuria, unspecified: Secondary | ICD-10-CM | POA: Diagnosis not present

## 2018-11-01 DIAGNOSIS — E669 Obesity, unspecified: Secondary | ICD-10-CM | POA: Diagnosis not present

## 2018-11-01 DIAGNOSIS — N183 Chronic kidney disease, stage 3 (moderate): Secondary | ICD-10-CM | POA: Diagnosis not present

## 2018-11-04 ENCOUNTER — Other Ambulatory Visit: Payer: Self-pay | Admitting: Physician Assistant

## 2018-11-04 DIAGNOSIS — I10 Essential (primary) hypertension: Secondary | ICD-10-CM

## 2018-11-04 DIAGNOSIS — F419 Anxiety disorder, unspecified: Secondary | ICD-10-CM

## 2018-11-04 DIAGNOSIS — R600 Localized edema: Secondary | ICD-10-CM

## 2018-11-07 NOTE — Telephone Encounter (Signed)
Last seen 08/22/2019

## 2018-12-04 ENCOUNTER — Other Ambulatory Visit: Payer: Self-pay | Admitting: Physician Assistant

## 2019-01-12 ENCOUNTER — Ambulatory Visit: Payer: 59 | Admitting: Physician Assistant

## 2019-01-17 ENCOUNTER — Other Ambulatory Visit: Payer: Self-pay | Admitting: Thoracic Surgery (Cardiothoracic Vascular Surgery)

## 2019-01-17 DIAGNOSIS — I712 Thoracic aortic aneurysm, without rupture, unspecified: Secondary | ICD-10-CM

## 2019-01-17 DIAGNOSIS — I7121 Aneurysm of the ascending aorta, without rupture: Secondary | ICD-10-CM

## 2019-02-02 ENCOUNTER — Other Ambulatory Visit: Payer: Self-pay | Admitting: Physician Assistant

## 2019-02-02 DIAGNOSIS — F419 Anxiety disorder, unspecified: Secondary | ICD-10-CM

## 2019-02-02 DIAGNOSIS — I10 Essential (primary) hypertension: Secondary | ICD-10-CM

## 2019-02-02 NOTE — Telephone Encounter (Signed)
Last office visit 10/11/2018

## 2019-02-06 ENCOUNTER — Ambulatory Visit (INDEPENDENT_AMBULATORY_CARE_PROVIDER_SITE_OTHER): Payer: Medicare HMO | Admitting: Physician Assistant

## 2019-02-06 ENCOUNTER — Encounter: Payer: Self-pay | Admitting: Physician Assistant

## 2019-02-06 ENCOUNTER — Other Ambulatory Visit: Payer: Self-pay

## 2019-02-06 DIAGNOSIS — M150 Primary generalized (osteo)arthritis: Secondary | ICD-10-CM

## 2019-02-06 DIAGNOSIS — N182 Chronic kidney disease, stage 2 (mild): Secondary | ICD-10-CM

## 2019-02-06 DIAGNOSIS — M159 Polyosteoarthritis, unspecified: Secondary | ICD-10-CM | POA: Insufficient documentation

## 2019-02-06 DIAGNOSIS — I10 Essential (primary) hypertension: Secondary | ICD-10-CM

## 2019-02-06 DIAGNOSIS — E1165 Type 2 diabetes mellitus with hyperglycemia: Secondary | ICD-10-CM

## 2019-02-06 DIAGNOSIS — M8949 Other hypertrophic osteoarthropathy, multiple sites: Secondary | ICD-10-CM

## 2019-02-06 DIAGNOSIS — F5101 Primary insomnia: Secondary | ICD-10-CM

## 2019-02-06 DIAGNOSIS — F411 Generalized anxiety disorder: Secondary | ICD-10-CM | POA: Diagnosis not present

## 2019-02-06 DIAGNOSIS — R600 Localized edema: Secondary | ICD-10-CM

## 2019-02-06 DIAGNOSIS — M5136 Other intervertebral disc degeneration, lumbar region: Secondary | ICD-10-CM | POA: Diagnosis not present

## 2019-02-06 DIAGNOSIS — E039 Hypothyroidism, unspecified: Secondary | ICD-10-CM | POA: Diagnosis not present

## 2019-02-06 DIAGNOSIS — M51369 Other intervertebral disc degeneration, lumbar region without mention of lumbar back pain or lower extremity pain: Secondary | ICD-10-CM

## 2019-02-06 DIAGNOSIS — R413 Other amnesia: Secondary | ICD-10-CM

## 2019-02-06 MED ORDER — NIFEDIPINE ER OSMOTIC RELEASE 90 MG PO TB24: 90.0000 mg | ORAL_TABLET | Freq: Every day | ORAL | 3 refills | Status: DC

## 2019-02-06 MED ORDER — PANTOPRAZOLE SODIUM 40 MG PO TBEC: 40.0000 mg | DELAYED_RELEASE_TABLET | Freq: Two times a day (BID) | ORAL | 1 refills | Status: DC

## 2019-02-06 MED ORDER — HYDROCODONE-ACETAMINOPHEN 10-325 MG PO TABS: ORAL_TABLET | ORAL | 0 refills | Status: DC

## 2019-02-06 MED ORDER — NITROGLYCERIN 0.1 MG/HR TD PT24: 0.1000 mg | MEDICATED_PATCH | Freq: Every day | TRANSDERMAL | 3 refills | Status: DC

## 2019-02-06 MED ORDER — NALOXONE HCL 4 MG/0.1ML NA LIQD: 1.0000 | Freq: Once | NASAL | 1 refills | Status: AC

## 2019-02-06 MED ORDER — FUROSEMIDE 20 MG PO TABS: 20.0000 mg | ORAL_TABLET | Freq: Every day | ORAL | 1 refills | Status: DC

## 2019-02-06 MED ORDER — EXENATIDE ER 2 MG/0.85ML ~~LOC~~ AUIJ: 2.0000 mg | AUTO-INJECTOR | SUBCUTANEOUS | 5 refills | Status: DC

## 2019-02-06 MED ORDER — FENOFIBRATE MICRONIZED 134 MG PO CAPS: ORAL_CAPSULE | ORAL | 3 refills | Status: DC

## 2019-02-06 MED ORDER — SPIRONOLACTONE 25 MG PO TABS: 25.0000 mg | ORAL_TABLET | Freq: Every day | ORAL | 1 refills | Status: DC

## 2019-02-06 MED ORDER — DONEPEZIL HCL 10 MG PO TABS: 10.0000 mg | ORAL_TABLET | Freq: Every day | ORAL | 1 refills | Status: DC

## 2019-02-06 MED ORDER — METOPROLOL SUCCINATE ER 100 MG PO TB24: ORAL_TABLET | ORAL | 1 refills | Status: DC

## 2019-02-06 MED ORDER — MEMANTINE HCL ER 28 MG PO CP24: ORAL_CAPSULE | ORAL | 1 refills | Status: DC

## 2019-02-06 MED ORDER — CLONIDINE HCL 0.2 MG PO TABS: ORAL_TABLET | ORAL | 1 refills | Status: DC

## 2019-02-06 MED ORDER — ZOLPIDEM TARTRATE 10 MG PO TABS: 10.0000 mg | ORAL_TABLET | Freq: Every evening | ORAL | 1 refills | Status: DC | PRN

## 2019-02-06 MED ORDER — LOSARTAN POTASSIUM 100 MG PO TABS: 100.0000 mg | ORAL_TABLET | Freq: Every day | ORAL | 3 refills | Status: DC

## 2019-02-06 MED ORDER — POTASSIUM CHLORIDE ER 10 MEQ PO TBCR: 10.0000 meq | EXTENDED_RELEASE_TABLET | Freq: Two times a day (BID) | ORAL | 1 refills | Status: DC

## 2019-02-06 MED ORDER — VITAMIN D (ERGOCALCIFEROL) 1.25 MG (50000 UNIT) PO CAPS: ORAL_CAPSULE | ORAL | 3 refills | Status: DC

## 2019-02-06 MED ORDER — OLANZAPINE 5 MG PO TABS: 5.0000 mg | ORAL_TABLET | Freq: Every day | ORAL | 3 refills | Status: DC

## 2019-02-06 MED ORDER — HYDROCODONE-ACETAMINOPHEN 10-325 MG PO TABS: 1.0000 | ORAL_TABLET | Freq: Three times a day (TID) | ORAL | 0 refills | Status: DC | PRN

## 2019-02-06 MED ORDER — DULOXETINE HCL 60 MG PO CPEP: 60.0000 mg | ORAL_CAPSULE | Freq: Every day | ORAL | 3 refills | Status: DC

## 2019-02-06 MED ORDER — ALBUTEROL SULFATE HFA 108 (90 BASE) MCG/ACT IN AERS: INHALATION_SPRAY | RESPIRATORY_TRACT | 3 refills | Status: DC

## 2019-02-06 MED ORDER — LEVOTHYROXINE SODIUM 50 MCG PO TABS: ORAL_TABLET | ORAL | 3 refills | Status: DC

## 2019-02-06 MED ORDER — ROSUVASTATIN CALCIUM 20 MG PO TABS: 20.0000 mg | ORAL_TABLET | Freq: Every day | ORAL | 3 refills | Status: DC

## 2019-02-06 MED ORDER — SITAGLIPTIN PHOSPHATE 100 MG PO TABS: ORAL_TABLET | ORAL | 3 refills | Status: DC
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
## 2019-02-10 ENCOUNTER — Other Ambulatory Visit: Payer: Self-pay | Admitting: Physician Assistant

## 2019-02-10 DIAGNOSIS — I10 Essential (primary) hypertension: Secondary | ICD-10-CM

## 2019-02-14 NOTE — Progress Notes (Signed)
Patient on multiple high risk medications.  Would recommend weaning off of Xanax and considering safer alternative to [place] given age and Alzheimers.  Both of these medications are on the BEER's list.  She is at high risk for falls and delerium.  Could consider referral to neuropsychiatry if needed for uncontrolled anxiety/ mood symptoms.  Kara Nunez Kara Nunez, [facility]

## 2019-02-28 NOTE — Progress Notes (Signed)
noted 

## 2019-03-16 ENCOUNTER — Other Ambulatory Visit: Payer: Self-pay

## 2019-03-19 ENCOUNTER — Ambulatory Visit (INDEPENDENT_AMBULATORY_CARE_PROVIDER_SITE_OTHER): Payer: Medicare HMO | Admitting: Physician Assistant

## 2019-03-19 ENCOUNTER — Other Ambulatory Visit: Payer: Self-pay

## 2019-03-19 ENCOUNTER — Encounter: Payer: Self-pay | Admitting: Physician Assistant

## 2019-03-19 VITALS — BP 160/85 | HR 74 | Temp 97.1°F | Ht 64.0 in | Wt 225.0 lb

## 2019-03-19 DIAGNOSIS — E559 Vitamin D deficiency, unspecified: Secondary | ICD-10-CM | POA: Diagnosis not present

## 2019-03-19 DIAGNOSIS — M8949 Other hypertrophic osteoarthropathy, multiple sites: Secondary | ICD-10-CM

## 2019-03-19 DIAGNOSIS — E1165 Type 2 diabetes mellitus with hyperglycemia: Secondary | ICD-10-CM | POA: Diagnosis not present

## 2019-03-19 DIAGNOSIS — F419 Anxiety disorder, unspecified: Secondary | ICD-10-CM

## 2019-03-19 DIAGNOSIS — M5136 Other intervertebral disc degeneration, lumbar region: Secondary | ICD-10-CM

## 2019-03-19 DIAGNOSIS — D509 Iron deficiency anemia, unspecified: Secondary | ICD-10-CM | POA: Diagnosis not present

## 2019-03-19 DIAGNOSIS — M150 Primary generalized (osteo)arthritis: Secondary | ICD-10-CM

## 2019-03-19 DIAGNOSIS — I10 Essential (primary) hypertension: Secondary | ICD-10-CM

## 2019-03-19 DIAGNOSIS — N183 Chronic kidney disease, stage 3 (moderate): Secondary | ICD-10-CM | POA: Diagnosis not present

## 2019-03-19 DIAGNOSIS — M159 Polyosteoarthritis, unspecified: Secondary | ICD-10-CM

## 2019-03-19 DIAGNOSIS — R809 Proteinuria, unspecified: Secondary | ICD-10-CM | POA: Diagnosis not present

## 2019-03-19 DIAGNOSIS — E039 Hypothyroidism, unspecified: Secondary | ICD-10-CM | POA: Diagnosis not present

## 2019-03-19 DIAGNOSIS — Z79899 Other long term (current) drug therapy: Secondary | ICD-10-CM | POA: Diagnosis not present

## 2019-03-19 LAB — BAYER DCA HB A1C WAIVED: HB A1C (BAYER DCA - WAIVED): 8.1 % — ABNORMAL HIGH (ref <7.0)

## 2019-03-19 MED ORDER — POTASSIUM CHLORIDE ER 10 MEQ PO TBCR: 10.0000 meq | EXTENDED_RELEASE_TABLET | Freq: Two times a day (BID) | ORAL | 1 refills | Status: DC

## 2019-03-19 MED ORDER — ALPRAZOLAM 0.5 MG PO TABS: 0.5000 mg | ORAL_TABLET | Freq: Two times a day (BID) | ORAL | 0 refills | Status: DC | PRN

## 2019-03-19 MED ORDER — HYDROCODONE-ACETAMINOPHEN 7.5-325 MG PO TABS: 1.0000 | ORAL_TABLET | Freq: Three times a day (TID) | ORAL | 0 refills | Status: DC | PRN

## 2019-03-19 MED ORDER — VITAMIN D (ERGOCALCIFEROL) 1.25 MG (50000 UNIT) PO CAPS: ORAL_CAPSULE | ORAL | 3 refills | Status: DC

## 2019-03-20 LAB — LIPID PANEL
Chol/HDL Ratio: 3.7 ratio (ref 0.0–4.4)
Cholesterol, Total: 144 mg/dL (ref 100–199)
HDL: 39 mg/dL — ABNORMAL LOW (ref >39)
LDL Calculated: 39 mg/dL (ref 0–99)
Triglycerides: 331 mg/dL — ABNORMAL HIGH (ref 0–149)
VLDL Cholesterol Cal: 66 mg/dL — ABNORMAL HIGH (ref 5–40)

## 2019-03-20 LAB — IRON,TIBC AND FERRITIN PANEL
%SAT: 12
Iron: 58
TIBC: 475
UIBC: 417

## 2019-03-20 LAB — BASIC METABOLIC PANEL
BUN: 31 — AB (ref 4–21)
Creatinine: 1.9 — AB (ref 0.5–1.1)
Glucose: 206

## 2019-03-20 LAB — CBC AND DIFFERENTIAL
HCT: 44 (ref 36–46)
Hemoglobin: 14.5 (ref 12.0–16.0)

## 2019-03-20 LAB — TSH: TSH: 2.48 u[IU]/mL (ref 0.450–4.500)

## 2019-03-20 LAB — VITAMIN D 25 HYDROXY (VIT D DEFICIENCY, FRACTURES): Vit D, 25-Hydroxy: 62.5

## 2019-03-21 LAB — TOXASSURE SELECT 13 (MW), URINE

## 2019-03-23 ENCOUNTER — Other Ambulatory Visit: Payer: Self-pay | Admitting: Physician Assistant

## 2019-03-26 NOTE — Progress Notes (Signed)
Need to taper from Xanax given ongoing albeit intermittent need for Norco.  Consider addition of Buspar for anxiety if needed. UDS appropriate.   Job Holtsclaw M. Lajuana Ripple, [facility]
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
## 2019-03-29 ENCOUNTER — Telehealth: Payer: Self-pay | Admitting: Physician Assistant

## 2019-03-29 NOTE — Chronic Care Management (AMB) (Signed)
Chronic Care Management   Note  03/29/2019 Name: SUSI GOSLIN MRN: [id number] DOB: March 02, 1951  EFFA YARROW is a 68 y.o. year old female who is a primary care patient of Terald Sleeper, PA-C. I reached out to [name] by phone today in response to a referral sent by Ms. Elliot Gault Harter's health plan.    Ms. Enloe was given information about Chronic Care Management services today including:  1. CCM service includes personalized support from designated clinical staff supervised by her physician, including individualized plan of care and coordination with other care providers 2. 24/7 contact phone numbers for assistance for urgent and routine care needs. 3. Service will only be billed when office clinical staff spend 20 minutes or more in a month to coordinate care. 4. Only one practitioner may furnish and bill the service in a calendar month. 5. The patient may stop CCM services at any time (effective at the end of the month) by phone call to the office staff. 6. The patient will be responsible for cost sharing (co-pay) of up to 20% of the service fee (after annual deductible is met).  Patient agreed to services and verbal consent obtained.   Follow up plan: Telephone appointment with CCM team member scheduled for: 04/06/2019  [place]  ??[email or web address]   ??[phone]

## 2019-04-03 ENCOUNTER — Other Ambulatory Visit: Payer: Medicare HMO

## 2019-04-03 ENCOUNTER — Ambulatory Visit: Payer: Medicare HMO | Admitting: Thoracic Surgery (Cardiothoracic Vascular Surgery)

## 2019-04-06 ENCOUNTER — Ambulatory Visit: Payer: Medicare HMO | Admitting: Licensed Clinical Social Worker

## 2019-04-06 ENCOUNTER — Telehealth: Payer: Medicare HMO

## 2019-04-06 DIAGNOSIS — E1165 Type 2 diabetes mellitus with hyperglycemia: Secondary | ICD-10-CM

## 2019-04-06 DIAGNOSIS — M5136 Other intervertebral disc degeneration, lumbar region: Secondary | ICD-10-CM

## 2019-04-06 DIAGNOSIS — I712 Thoracic aortic aneurysm, without rupture: Secondary | ICD-10-CM

## 2019-04-06 DIAGNOSIS — I7121 Aneurysm of the ascending aorta, without rupture: Secondary | ICD-10-CM

## 2019-04-06 DIAGNOSIS — N182 Chronic kidney disease, stage 2 (mild): Secondary | ICD-10-CM

## 2019-04-06 DIAGNOSIS — F411 Generalized anxiety disorder: Secondary | ICD-10-CM

## 2019-04-06 DIAGNOSIS — F419 Anxiety disorder, unspecified: Secondary | ICD-10-CM

## 2019-04-06 NOTE — Chronic Care Management (AMB) (Signed)
  Care Management Note   Kara Nunez is a 68 y.o. year old female who is a primary care patient of Terald Sleeper, PA-C. The CM team was consulted for assistance with chronic disease management and care coordination.   I reached out to Kara Nunez by phone today.   Review of patient status, including review of consultants reports, relevant laboratory and other test results, and collaboration with appropriate care team members and the patient's provider was performed as part of comprehensive patient evaluation and provision of chronic care management services.   Social Determinants of health; risk of depression; pain issues; risk of social isolation    Chronic Care Management from 04/06/2019 in [place]  PHQ-9 Total Score  8     GAD 7 : Generalized Anxiety Score 04/06/2019  Nervous, Anxious, on Edge 2  Control/stop worrying 3  Worry too much - different things 3  Trouble relaxing 0  Restless 0  Easily annoyed or irritable 0  Afraid - awful might happen 1  Total GAD 7 Score 9   Goals Addressed            This Visit's Progress   . Client wants to talk with LCSW about managing stress/anxiety about health conditions (pt-stated)       Current Barriers:  . Kidney issues . Pain issues (lower back, buttocks back og thighs)  Social isolation risk  Clinical Social Work Clinical Goal(s):  . Client and LCSW to talk in next 30 days about client management of anxiety and stress issues  Interventions: . Talked with client about CCM program . Encouraged client to talk with [facility] about nursing needs . Talked with Kara Nunez about medications procurement . Talked about relaxation techniques  (swimming,reading,talking with neighbor)  Patient Self Care Activities:   Takes medications as prescribed Attends medical appointments  Plan: Attend scheduled medical appointments Client to talk with [facility] about nursing needs Client to use relaxation activities as able  to manage stress/anxiety LCSW to call client in 3 weeks to discuss psychosocial needs of client  Initial goal documentation       Client is concerned about kidney function/kidney status.  She has had difficulty with communication with kidney specialist . She said she had called that specialist several times but has not heard back from kidney specialist office recently. She has pain in lower back,buttocks and thighs.  She has no dizziness. She is taking prescribed medications .She expressed interest in talking with RNCM about her nursing needs.  Follow Up Plan: LCSW to call client in next 3 weeks to talk with client about psychosocial needs of client  [name].Rc Amison MSW, LCSW Licensed Clinical Social Worker [facility] Care Management [phone]

## 2019-04-06 NOTE — Patient Instructions (Signed)
Licensed Clinical Social Worker Visit Information  Goals we discussed today:  Goals Addressed            This Visit's Progress   . Client wants to talk with LCSW about managing stress/anxiety about health conditions (pt-stated)       Current Barriers:  . Kidney issues . Pain issues (lower back, buttocks back og thighs) . Social isolation potential  Clinical Social Work Clinical Goal(s):  . Client and LCSW to talk in next 30 days about client management of anxiety and stress.  Interventions: . Talked with client about CM program . Encouraged client  to talk with [facility] about nursing needs . Talked with [place] about medications procurement . Talked about relaxation techniques  (swimming,reading,talking with neighbor)   Patient Self Care Activities:   Takes medications as prescribed Attends medical appointments  Plan: Attend scheduled medical appointments Client to talk with [facility] about nursing needs Client to use relaxation activities as able to help in managing anxiety/stress LCSW to call client in 3 weeks to discuss psychosocial needs of client  Initial goal documentation         Materials Provided: No   Follow Up Plan: LCSW to call client in next 3 weeks to discuss psychosocial needs of client  The patient verbalized understanding of instructions provided today and declined a print copy of patient instruction materials.   [name].Trestan Vahle MSW, LCSW Licensed Clinical Social Worker [facility] Care Management [phone]

## 2019-04-10 ENCOUNTER — Other Ambulatory Visit: Payer: Self-pay

## 2019-04-10 ENCOUNTER — Ambulatory Visit: Payer: Medicare HMO | Admitting: Thoracic Surgery (Cardiothoracic Vascular Surgery)

## 2019-04-10 ENCOUNTER — Ambulatory Visit
Admission: RE | Admit: 2019-04-10 | Discharge: 2019-04-10 | Disposition: A | Discharge status: Home / Self Care | Payer: Medicare HMO | Source: Ambulatory Visit | Attending: Thoracic Surgery (Cardiothoracic Vascular Surgery) | Admitting: Thoracic Surgery (Cardiothoracic Vascular Surgery)

## 2019-04-10 DIAGNOSIS — I712 Thoracic aortic aneurysm, without rupture, unspecified: Secondary | ICD-10-CM

## 2019-04-10 NOTE — Progress Notes (Unsigned)
This encounter was created in error - please disregard.

## 2019-04-11 ENCOUNTER — Other Ambulatory Visit: Payer: Self-pay | Admitting: *Deleted

## 2019-04-11 ENCOUNTER — Ambulatory Visit: Payer: Medicare HMO | Admitting: *Deleted

## 2019-04-11 DIAGNOSIS — E1165 Type 2 diabetes mellitus with hyperglycemia: Secondary | ICD-10-CM

## 2019-04-11 DIAGNOSIS — N182 Chronic kidney disease, stage 2 (mild): Secondary | ICD-10-CM

## 2019-04-11 DIAGNOSIS — J9611 Chronic respiratory failure with hypoxia: Secondary | ICD-10-CM

## 2019-04-11 NOTE — Patient Outreach (Signed)
[place] [facility]) Care Management  04/11/2019  ANYSSA SHARPLESS [birth year] [id number]    Subjective: Telephone call to patient's home  / mobile number, no answer, left HIPAA compliant voicemail message, and requested call back.    Objective: Per KPN (Knowledge Performance Now, point of care tool) and chart review, patient has had no recent hospitalizations or ED visits.   Patient also has a history of diabetes, hypertension, hypothyroidism, DDD (degenerative disc disease), lumbar, and Primary osteoarthritis involving multiple joints.    Assessment: Received Josephine Igo MD referral on 04/11/2019.  Referral source: [name] Physician Assistant.   Referral reason: medication assistance, diabetes.    Screening  follow up pending patient contact.     Plan: RNCM will send unsuccessful outreach  letter, [facility] pamphlet, will call patient for 2nd telephone outreach attempt within 4 business days, screening follow up, and will proceed with case closure within 10 business days if no return call.     Kiah Keay H. Annia Friendly, BSN, [place] [facility] Telephonic CM Phone: [phone] Fax: [phone]

## 2019-04-12 ENCOUNTER — Other Ambulatory Visit: Payer: Medicare HMO | Admitting: *Deleted

## 2019-04-12 ENCOUNTER — Encounter: Payer: Self-pay | Admitting: *Deleted

## 2019-04-12 NOTE — Patient Outreach (Addendum)
[place] [facility]) Care Management  04/12/2019  Kara Nunez 07-16-1951 [id number]   Subjective: Telephone call to patient's home / mobile number, spoke with patient, and HIPAA verified.  Discussed [place] Medicare MD referral follow up, patient voiced understanding, and is in agreement to follow up.  Patient states she is doing fine, was not aware of referral from primary provider, had follow up appointment with primary provider on 03/19/2019, and appointment went well.   States she is having financial hardship with affording all of her medication copays in general and needs assistance.  Discussed this  RNCM's role, her care coordination with [place] [facility] Embedded team (Nurse Care Coordinator, Social Worker),[facility] Care Management services, patient voices understanding, and is in agreement to referral to [place] for medication copay assistance. Patient states she is able to manage self care and has assistance as needed.  Patient voices understanding of medical diagnosis and treatment plan.  Discussed Advanced Directives, advised of [place] [facility] Advanced Directives document completion benefit, patient voices understanding, and is in agreement to a referral to Embedded Education officer, museum for [employer] packet follow up, and document completion.   Patient states she is also in need of some assistance with stress management and is in agreement to a referral to Embedded Education officer, museum for stress management / counseling.  States she is accessing her [facility] Medicare benefits as needed via member services number on back of card.   Patient states she does not have any  transition of care, or telephonic RNCM needs at this time.   States she is very appreciative of the follow up and is in agreement to receive [place] Management services.  The following in - basket message sent to Chronic Care Management Embedded team (Kara Nunez  Coordinator,  Kara Nunez Social Worker): [place],  I have completed the telephonic screening assessment, referred patient to [place] for medication copay assistance.  Kara Nunez, please give patient Advanced Directive packet and follow up for document completion.  Patient is also in need of stress management community resources and counseling. Please see RNCM note for additional information and let me know if you have any questions.  Thanks,  Kara Nunez    Objective:  Per KPN (Knowledge Performance Now, point of care tool) and chart review, patient has had no recent hospitalizations or ED visits.   Patient also has a history of diabetes, hypertension, hypothyroidism, DDD (degenerative disc disease), lumbar, and Primary osteoarthritis involving multiple joints.    Assessment: Received Kara Igo MD referral on 04/11/2019.  Referral source: Kara Nunez Physician Assistant.   Referral reason: medication assistance, diabetes.    Screening follow up completed, will refer patient to [place] for medication copay assistance. Will refer patient to Chronic Care Management  Embedded Social Worker for Advanced Directive packet follow up, document completion, stress management community resources, and counseling.     Plan: RNCM will refer patient to [place] for medication copay assistance. RNCM will refer patient to Chronic Care Management  Embedded Social Worker for Advanced Directive packet follow up, document completion, stress management community resources, and counseling.      Kara Kara Nunez, BSN, [place] [facility] [place] Telephonic CM Phone: [phone] Fax: [phone]

## 2019-04-12 NOTE — Chronic Care Management (AMB) (Signed)
Chronic Care Management   Initial Visit Note  04/11/2019 Name: ELENIE COVEN MRN: [id number] DOB: May 13, 1951  Referred by: Terald Sleeper, PA-C Reason for referral : Chronic Care Management (RNCM Initial Visit)   NEVEAH BANG is a 68 y.o. year old female who is a primary care patient of Terald Sleeper, PA-C. The CCM team was consulted for assistance with chronic disease management and care coordination needs.   Review of patient status, including review of consultants reports, relevant laboratory and other test results, and collaboration with appropriate care team members and the patient's provider was performed as part of comprehensive patient evaluation and provision of chronic care management services.    SDOH (Social Determinants of Health) screening performed today. Areas of Interest: Depression   Social Connections. Patient is talking with our LCSW regarding psychosocial issues.   Subjective: "I feel kind of bad overall. I'm more tired than usual and have some bodyaches." I spoke with patient by telephone. She was concerned about her rec  Objective:    Chemistry      Component Value Date/Time   NA 140 10/11/2018 0922   K 4.1 10/11/2018 0922   CL 103 10/11/2018 0922   CO2 20 10/11/2018 0922   BUN 22 10/11/2018 0922   CREATININE 1.77 (H) 10/11/2018 0922   CREATININE 1.0 10/07/2015 0950   CREATININE 1.00 09/17/2014 0815      Component Value Date/Time   CALCIUM 10.2 10/11/2018 0922   ALKPHOS 71 10/11/2018 0922   AST 23 10/11/2018 0922   ALT 29 10/11/2018 0922   BILITOT <0.2 10/11/2018 0922     BMI Readings from Last 3 Encounters:  03/19/19 38.62 kg/m  10/11/18 37.73 kg/m  10/10/18 38.11 kg/m      Goals Addressed      Patient Stated   . "I want to increase my strength" (pt-stated)       Current Barriers:  . Lacks caregiver support.  . Film/video editor.  . Chronic Disease Management support and education needs related to exercises that can be done  safely in the home.   Nurse Case Manager Clinical Goal(s):  Marland Kitchen Over the next 7 days, patient will verbalize understanding of plan for physical conditioning and strengthening . Over the next 30 days, patient will work with Consulting civil engineer to address needs related to deconditioning due to deceased activity level  Interventions:  . Advised patient to limit activity outdoors for now due to heat/humidity . Provided education to patient re: exercises that can be done in her apartment . Discussed plans with patient for ongoing care management follow up and provided patient with direct contact information for care management team . Will mail EMMI educational handouts on home exercises  Patient Self Care Activities:  . Performs ADL's independently . Performs IADL's independently  Initial goal documentation     . "I want to keep up with my kidney health" (pt-stated)       Current Barriers:  . Lacks caregiver support.  . Film/video editor.  . Literacy barriers . Chronic Disease Management support and education needs related to CKD.  Nurse Case Manager Clinical Goal(s):  Marland Kitchen Over the next 30 days, patient will work with Consulting civil engineer to address needs related to chronic kidney disease  Interventions:  . Collaborated with Dr Florentina Addison office regarding recent lab results . Requested that recent labs be faxed to [facility] at [phone] [facility] . Verified with [facility] front office staff that records were received  Patient  Self Care Activities:  . Performs ADL's independently . Performs IADL's independently  Initial goal documentation         Plan:  The care management team will reach out to the patient again over the next 2 days.   Chong Sicilian BSN, RN-BC Embedded Chronic Care Manager [facility] / [place] [facility] Direct Dial: [phone]

## 2019-04-13 ENCOUNTER — Ambulatory Visit: Payer: Medicare HMO | Admitting: *Deleted

## 2019-04-13 DIAGNOSIS — F419 Anxiety disorder, unspecified: Secondary | ICD-10-CM

## 2019-04-13 DIAGNOSIS — N182 Chronic kidney disease, stage 2 (mild): Secondary | ICD-10-CM

## 2019-04-13 IMAGING — US US BIOPSY
1 series · 10 of 10 positions shown · non-contrast
Comparison: Prior renal biopsy 09/20/2016

INDICATION: 66-year-old female with persistent proteinuria

EXAM:
ULTRASOUND GUIDED RENAL BIOPSY

[Series 1: us biopsy · 0.19mm/px · 10 of 10 slices shown]
[im 1/10]
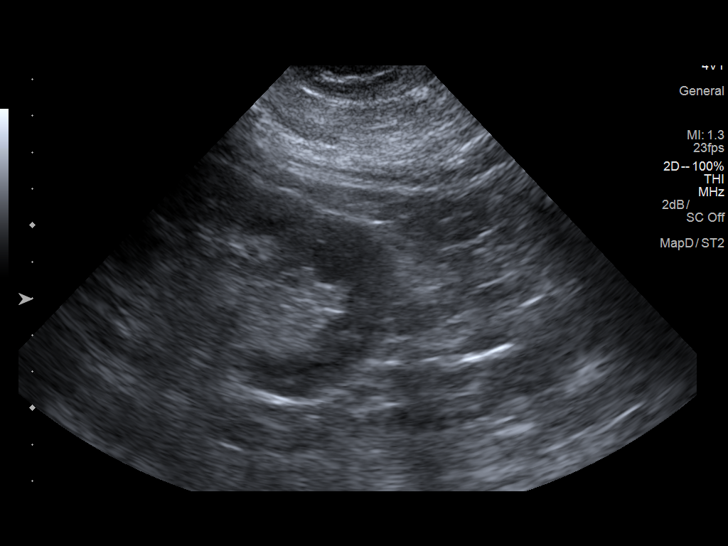
[im 2/10]
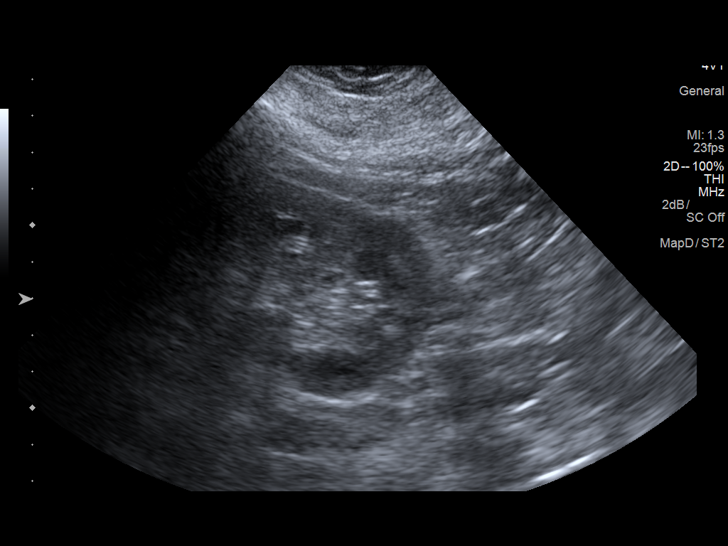
[im 3/10]
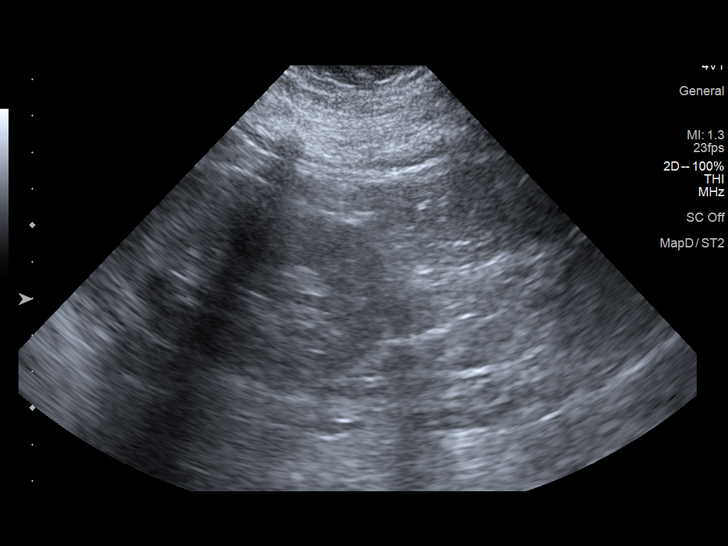
[im 4/10]
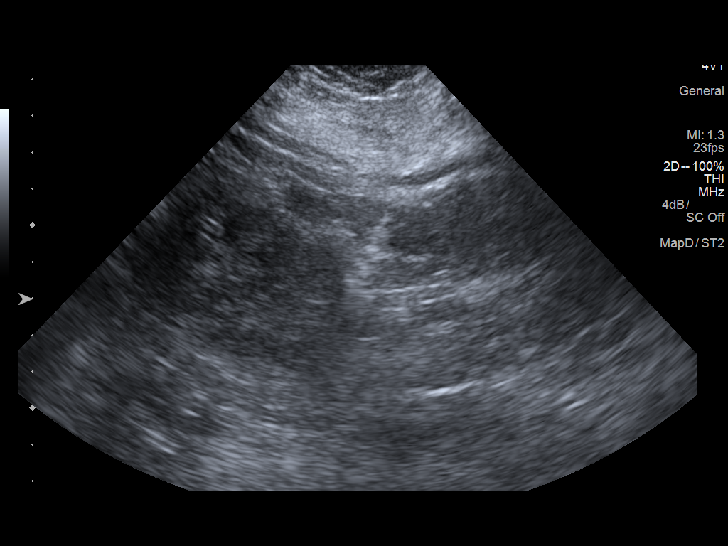
[im 5/10]
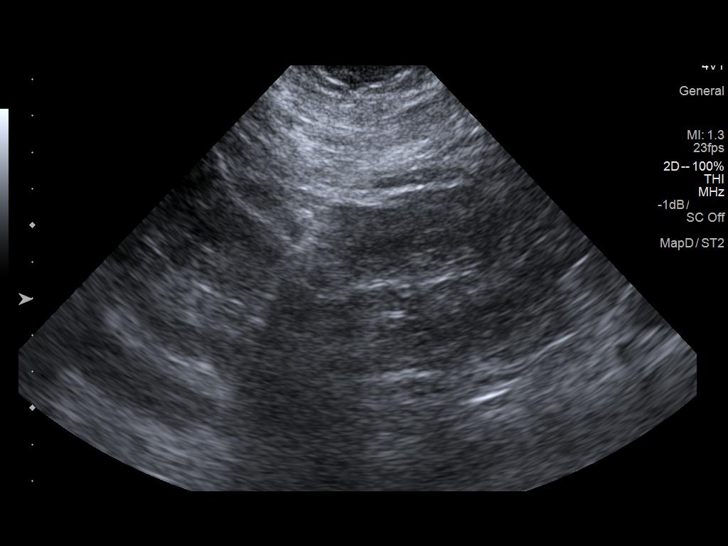
[im 6/10]
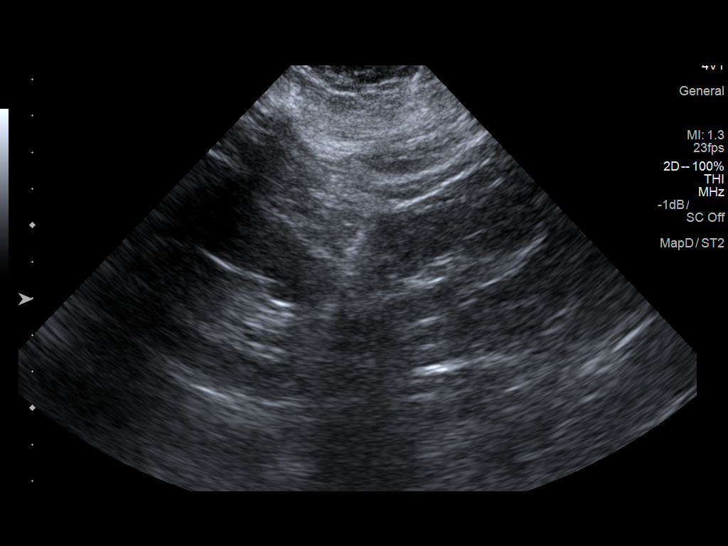
[im 7/10]
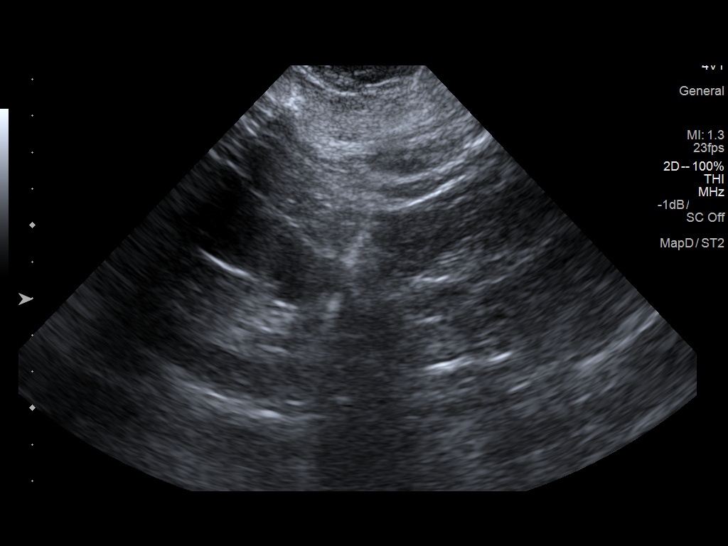
[im 8/10]
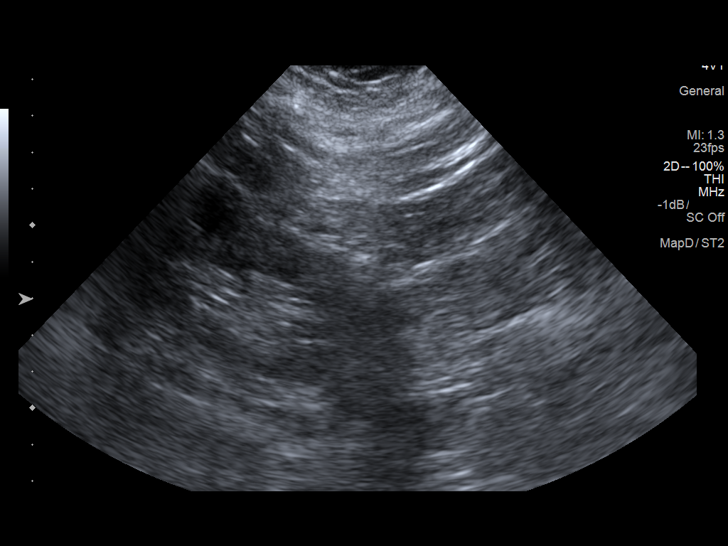
[im 9/10]
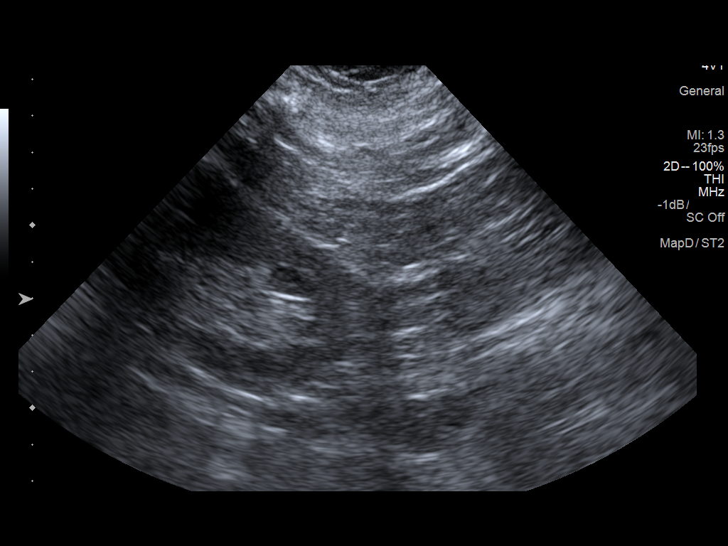
[im 10/10]
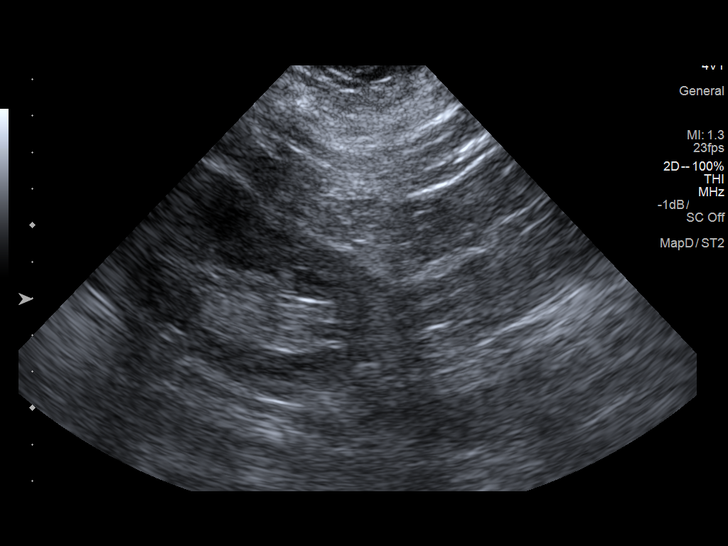

[10 of 10 positions shown; findings below may reference images not displayed]

MEDICATIONS:
Fentanyl 50 mcg IV; Versed 1 mg IV

ANESTHESIA/SEDATION:
Moderate (conscious) sedation was performed. The patient's level of
consciousness and vital signs were monitored continuously by
radiology nursing under my direct supervision throughout the
procedure.

Total Moderate Sedation time

Fifteen minutes

COMPLICATIONS:
None immediate

PROCEDURE:
Informed written consent was obtained from the patient after a
discussion of the risks, benefits and alternatives to treatment. The
patient understands and consents the procedure. A timeout was
performed prior to the initiation of the procedure.

Ultrasound scanning was performed of the bilateral flanks. The
inferior pole of the left kidney was selected for biopsy due to
location and sonographic window. The procedure was planned. The
operative site was prepped and draped in the usual sterile fashion.
The overlying soft tissues were anesthetized with 1% lidocaine with
epinephrine. A 17 gauge core needle biopsy device was advanced into
the inferior cortex of the left kidney and 2 core biopsies were
obtained under direct ultrasound guidance. Images were saved for
documentation purposes. The biopsy device was removed and hemostasis
was obtained with manual compression. Post procedural scanning was
negative for significant post procedural hemorrhage or additional
complication. A dressing was placed. The patient tolerated the
procedure well without immediate post procedural complication.
IMPRESSION: Technically successful ultrasound guided left renal biopsy.

## 2019-04-13 NOTE — Patient Instructions (Signed)
Visit Information  Goals Addressed            This Visit's Progress     Patient Stated   . "I want to keep up with my kidney health" (pt-stated)       Current Barriers:  . Lacks caregiver support.  . Film/video editor.  . Literacy barriers . Chronic Disease Management support and education needs related to CKD.  Nurse Case Manager Clinical Goal(s):  Marland Kitchen Over the next 30 days, patient will work with RN Care Manager to address needs related to chronic kidney disease  Interventions:  . Lab results received and compared to labs from 09/2018. Essentially no change in kidney functions . Coordinated appt with Dr Lowanda Foster at patient's request.  o Scheduled for telephone visit on 05/01/19 at 9:30 . Provided reassurance and encouraged to continue current treatment . Advised to reach out to [facility] or Dr Florentina Addison office if she has any concerns . Provided RNCM contact information   Patient Self Care Activities:  . Performs ADL's independently . Performs IADL's independently  Initial goal documentation         The patient verbalized understanding of instructions provided today and declined a print copy of patient instruction materials.   The care management team will reach out to the patient again over the next 30 days.   Chong Sicilian BSN, RN-BC Embedded Chronic Care Manager [facility] / [place] Management Direct Dial: [phone]

## 2019-04-13 NOTE — Chronic Care Management (AMB) (Signed)
  Chronic Care Management   Follow Up Note   04/13/2019 Name: Kara Nunez MRN: [id number] DOB: 1950-12-03  Referred by: Kara Sleeper, PA-C Reason for referral : Chronic Care Management (Care Coordination)   Kara Nunez is a 68 y.o. year old female who is a primary care patient of Kara Sleeper, PA-C. The CCM team was consulted for assistance with chronic disease management and care coordination needs.    Review of patient status, including review of consultants reports, relevant laboratory and other test results, and collaboration with appropriate care team members and the patient's provider was performed as part of comprehensive patient evaluation and provision of chronic care management services.    Subjective: I spoke with patient by telephone today regarding lab results and appointment with nephrologist. She was expecting an appointment with Dr Kara Nunez to review lab results and plan of care but has not received a call back to schedule this.   Objective:   Chemistry      BUN 31 (A) 03/20/2019   CREATININE 1.9 (A) 03/20/2019   CREATININE 1.77 (H) 10/11/2018 0922   CREATININE 1.0 10/07/2015 0950   CREATININE 1.00 09/17/2014 0815   GLU 206 03/20/2019     Assessment: Goals Addressed    . "I want to keep up with my kidney health" (pt-stated)       Current Barriers:  . Lacks caregiver support.  . Film/video editor.  . Literacy barriers . Chronic Disease Management support and education needs related to CKD.  Nurse Case Manager Clinical Goal(s):  Marland Kitchen Over the next 30 days, patient will work with RN Care Manager to address needs related to chronic kidney disease  Interventions:  . Lab results received and compared to labs from 09/2018. Essentially no change in kidney functions . Results abstracted . Coordinated appt with Dr Kara Nunez at patient's request.  o Scheduled for telephone visit on 05/01/19 at 9:30 . Provided reassurance and encouraged to continue current  treatment . Advised to reach out to [facility] or Dr Kara Nunez office if she has any concerns . Provided RNCM contact information   Patient Self Care Activities:  . Performs ADL's independently . Performs IADL's independently  Initial goal documentation         Follow Up Plan The care management team will reach out to the patient again over the next 30 days.  The patient has been provided with contact information for the care management team and has been advised to call with any health related questions or concerns.    Chong Sicilian BSN, RN-BC Embedded Chronic Care Manager [facility] / [place] Management Direct Dial: [phone]

## 2019-04-19 ENCOUNTER — Other Ambulatory Visit: Payer: Self-pay | Admitting: Pharmacist

## 2019-04-19 NOTE — Patient Outreach (Signed)
[facility]) Care Management  [place]  04/19/2019  AGAM TUOHY 1951-08-03 [id number]   Reason for referral: Medication Assistance  Referral source: [facility] RN Current insurance: [employer]  Outreach:  Unsuccessful telephone call attempt #1 to patient.   HIPAA compliant voicemail left requesting a return call  Plan:  -I will make another outreach attempt to patient within 3-4 business days.    Regina Eck, PharmD, [place]  [phone]

## 2019-04-23 ENCOUNTER — Telehealth: Payer: Medicare HMO

## 2019-04-24 ENCOUNTER — Other Ambulatory Visit: Payer: Self-pay

## 2019-04-24 ENCOUNTER — Ambulatory Visit (INDEPENDENT_AMBULATORY_CARE_PROVIDER_SITE_OTHER): Payer: Medicare HMO | Admitting: Physician Assistant

## 2019-04-24 ENCOUNTER — Ambulatory Visit: Payer: Self-pay | Admitting: Pharmacist

## 2019-04-24 ENCOUNTER — Encounter: Payer: Self-pay | Admitting: Physician Assistant

## 2019-04-24 DIAGNOSIS — M150 Primary generalized (osteo)arthritis: Secondary | ICD-10-CM | POA: Diagnosis not present

## 2019-04-24 DIAGNOSIS — F419 Anxiety disorder, unspecified: Secondary | ICD-10-CM | POA: Diagnosis not present

## 2019-04-24 DIAGNOSIS — M8949 Other hypertrophic osteoarthropathy, multiple sites: Secondary | ICD-10-CM

## 2019-04-24 DIAGNOSIS — I129 Hypertensive chronic kidney disease with stage 1 through stage 4 chronic kidney disease, or unspecified chronic kidney disease: Secondary | ICD-10-CM | POA: Diagnosis not present

## 2019-04-24 DIAGNOSIS — F411 Generalized anxiety disorder: Secondary | ICD-10-CM | POA: Diagnosis not present

## 2019-04-24 DIAGNOSIS — M159 Polyosteoarthritis, unspecified: Secondary | ICD-10-CM

## 2019-04-24 DIAGNOSIS — N182 Chronic kidney disease, stage 2 (mild): Secondary | ICD-10-CM

## 2019-04-24 DIAGNOSIS — F339 Major depressive disorder, recurrent, unspecified: Secondary | ICD-10-CM | POA: Diagnosis not present

## 2019-04-24 DIAGNOSIS — E1165 Type 2 diabetes mellitus with hyperglycemia: Secondary | ICD-10-CM | POA: Diagnosis not present

## 2019-04-24 DIAGNOSIS — I10 Essential (primary) hypertension: Secondary | ICD-10-CM

## 2019-04-24 DIAGNOSIS — M5136 Other intervertebral disc degeneration, lumbar region: Secondary | ICD-10-CM

## 2019-04-24 MED ORDER — HYDROCODONE-ACETAMINOPHEN 7.5-325 MG PO TABS: 1.0000 | ORAL_TABLET | Freq: Three times a day (TID) | ORAL | 0 refills | Status: DC | PRN

## 2019-04-24 MED ORDER — ESCITALOPRAM OXALATE 10 MG PO TABS: 10.0000 mg | ORAL_TABLET | Freq: Every day | ORAL | 5 refills | Status: DC

## 2019-04-24 NOTE — Progress Notes (Signed)
Telephone visit  Subjective: Kara Nunez recheck PCP: Terald Sleeper, PA-C IEP:PIRJJO Kara Nunez is a 68 y.o. female calls for telephone consult today. Patient provides verbal consent for consult held via phone.  Patient is identified with 2 separate identifiers.  At this time the entire area is on COVID-19 social distancing and stay home orders are in place.  Patient is of higher risk and therefore we are performing this by a virtual method.  Location of patient: home Location of provider: HOME Others present for call: no This patient is having a follow-up from her chronic medical conditions.  She is now living in [place].  She does travel down her a lot.  But she would like to have her nephrologist in that area.  And will place a referral to hopefully that [place] system will have a nephrologist associated with it.  She does have chronic kidney disease.  She is having a lot more depression and crying.  She has been on the Cymbalta for many years.  However she does feel significantly stressed out, and down a good line.  We are going to have her switch over to Lexapro 10 mg 1 daily.  The medication will be sent to pill pack for the next fill.  I am also going to send her pain medication to the pharmacy in [place].  It is canceled at the pharmacy in [place]     ROS: Per HPI  Allergies  Allergen Reactions   Codeine Nausea Only   Amoxicillin Rash   Past Medical History:  Diagnosis Date   Allergic rhinitis    Ascending aortic aneurysm ([place])    Followed by Dr. [name]   Chronic back pain    Coronary atherosclerosis of native coronary artery    Prior evaluation with in [facility] - details not clear    Degenerative disc disease    Essential hypertension, benign    Hyperlipidemia    Insomnia    Type 2 diabetes mellitus (HCC)     Current Outpatient Medications:    albuterol (VENTOLIN HFA) 108 (90 Base) MCG/ACT inhaler, USE 2 PUFFS EVERY 6  HOURS AS NEEDED, Disp: 54 g, Rfl: 3   ALPRAZolam (XANAX) 0.5 MG tablet, Take 1 tablet (0.5 mg total) by mouth 2 (two) times daily as needed for anxiety., Disp: 180 tablet, Rfl: 0   aspirin EC 81 MG tablet, Take 81 mg by mouth daily., Disp: , Rfl:    clobetasol cream (TEMOVATE) 8.41 %, Apply 1 application topically 2 (two) times daily., Disp: 60 g, Rfl: 2   cloNIDine (CATAPRES) 0.2 MG tablet, TAKE ONE (1) TABLET THREE (3) TIMES EACH DAY, Disp: 270 tablet, Rfl: 1   donepezil (ARICEPT) 10 MG tablet, Take 1 tablet (10 mg total) by mouth at bedtime., Disp: 90 tablet, Rfl: 1   escitalopram (LEXAPRO) 10 MG tablet, Take 1 tablet (10 mg total) by mouth daily., Disp: 30 tablet, Rfl: 5   Exenatide ER (BYDUREON BCISE) 2 MG/0.85ML AUIJ, Inject 2 mg into the skin once a week., Disp: 3.4 mL, Rfl: 5   fenofibrate micronized (LOFIBRA) 134 MG capsule, TAKE 1 CAPSULE EVERY MORNING BEFORE BREAKFAST, Disp: 90 capsule, Rfl: 3   fluorouracil (EFUDEX) 5 % cream, Apply 1 application topically 2 (two) times daily as needed (Use on face)., Disp: , Rfl:    furosemide (LASIX) 20 MG tablet, Take 1 tablet (20 mg total) by mouth daily., Disp: 90 tablet, Rfl: 1   HYDROcodone-acetaminophen (NORCO) 7.5-325 MG tablet, Take  1 tablet by mouth every 8 (eight) hours as needed for severe pain., Disp: 90 tablet, Rfl: 0   HYDROcodone-acetaminophen (NORCO) 7.5-325 MG tablet, Take 1 tablet by mouth every 8 (eight) hours as needed for severe pain., Disp: 90 tablet, Rfl: 0   IFEREX 150 150 MG capsule, TAKE ONE (1) CAPSULE EACH DAY, Disp: 90 capsule, Rfl: 3   levothyroxine (SYNTHROID) 50 MCG tablet, TAKE ONE TABLET EACH MORNING BEFORE BREAKFAST, Disp: 90 tablet, Rfl: 3   losartan (COZAAR) 100 MG tablet, Take 1 tablet (100 mg total) by mouth daily., Disp: 90 tablet, Rfl: 3   memantine (NAMENDA XR) 28 MG CP24 24 hr capsule, TAKE ONE (1) CAPSULE EACH DAY, Disp: 90 capsule, Rfl: 1   metoprolol succinate (TOPROL-XL) 100 MG 24 hr  tablet, TAKE ONE (1) TABLET EACH DAY, Disp: 90 tablet, Rfl: 1   NIFEdipine (PROCARDIA XL/NIFEDICAL-XL) 90 MG 24 hr tablet, Take 1 tablet (90 mg total) by mouth daily., Disp: 90 tablet, Rfl: 3   nitroGLYCERIN (NITRODUR - DOSED IN MG/24 HR) 0.1 mg/hr patch, Place 1 patch (0.1 mg total) onto the skin daily., Disp: 90 patch, Rfl: 3   OLANZapine (ZYPREXA) 5 MG tablet, Take 1 tablet (5 mg total) by mouth at bedtime., Disp: 90 tablet, Rfl: 3   pantoprazole (PROTONIX) 40 MG tablet, Take 1 tablet (40 mg total) by mouth 2 (two) times daily., Disp: 180 tablet, Rfl: 1   potassium chloride (K-DUR) 10 MEQ tablet, Take 1 tablet (10 mEq total) by mouth 2 (two) times daily., Disp: 180 tablet, Rfl: 1   rosuvastatin (CRESTOR) 20 MG tablet, Take 1 tablet (20 mg total) by mouth daily., Disp: 90 tablet, Rfl: 3   sitaGLIPtin (JANUVIA) 100 MG tablet, TAKE ONE (1) TABLET EACH DAY, Disp: 90 tablet, Rfl: 3   spironolactone (ALDACTONE) 25 MG tablet, Take 1 tablet (25 mg total) by mouth daily., Disp: 90 tablet, Rfl: 1   Vitamin D, Ergocalciferol, (DRISDOL) 1.25 MG (50000 UT) CAPS capsule, TAKE 1 CAPSULE TWICE A WEEK, Disp: 24 capsule, Rfl: 3   zolpidem (AMBIEN) 10 MG tablet, Take 1 tablet (10 mg total) by mouth at bedtime as needed., Disp: 90 tablet, Rfl: 1  Assessment/ Plan: 68 y.o. female   1. DDD (degenerative disc disease), lumbar - HYDROcodone-acetaminophen (NORCO) 7.5-325 MG tablet; Take 1 tablet by mouth every 8 (eight) hours as needed for severe pain.  Dispense: 90 tablet; Refill: 0 - HYDROcodone-acetaminophen (NORCO) 7.5-325 MG tablet; Take 1 tablet by mouth every 8 (eight) hours as needed for severe pain.  Dispense: 90 tablet; Refill: 0  2. Primary osteoarthritis involving multiple joints - HYDROcodone-acetaminophen (NORCO) 7.5-325 MG tablet; Take 1 tablet by mouth every 8 (eight) hours as needed for severe pain.  Dispense: 90 tablet; Refill: 0 - HYDROcodone-acetaminophen (NORCO) 7.5-325 MG tablet; Take  1 tablet by mouth every 8 (eight) hours as needed for severe pain.  Dispense: 90 tablet; Refill: 0  3. Anxiety - escitalopram (LEXAPRO) 10 MG tablet; Take 1 tablet (10 mg total) by mouth daily.  Dispense: 30 tablet; Refill: 5  4. Essential hypertension  5. Uncontrolled type 2 diabetes mellitus with hyperglycemia (HCC)  6. Stage 2 chronic kidney disease - Ambulatory referral to Nephrology  7. GAD (generalized anxiety disorder)  8. Depression, recurrent ([place]) - escitalopram (LEXAPRO) 10 MG tablet; Take 1 tablet (10 mg total) by mouth daily.  Dispense: 30 tablet; Refill: 5   Return in about 4 weeks (around 05/22/2019) for recheck medications.  Continue all other maintenance  medications as listed above.  Start time: 10:25 AM  End time: 10:40 AM  Meds ordered this encounter  Medications   escitalopram (LEXAPRO) 10 MG tablet    Sig: Take 1 tablet (10 mg total) by mouth daily.    Dispense:  30 tablet    Refill:  5    Change from cymbalta to lexapro at next shipment    Order Specific Question:   Supervising Provider    Answer:   Janora Norlander [[id number]]   HYDROcodone-acetaminophen ([place]) 7.5-325 MG tablet    Sig: Take 1 tablet by mouth every 8 (eight) hours as needed for severe pain.    Dispense:  90 tablet    Refill:  0    Order Specific Question:   Supervising Provider    Answer:   Janora Norlander [[id number]]   HYDROcodone-acetaminophen ([place]) 7.5-325 MG tablet    Sig: Take 1 tablet by mouth every 8 (eight) hours as needed for severe pain.    Dispense:  90 tablet    Refill:  0    Fill 30 days from original script date    Order Specific Question:   Supervising Provider    Answer:   Janora Norlander [[id number]]    [name] PA-C [place] [phone]

## 2019-04-24 NOTE — Progress Notes (Deleted)
There were no vitals taken for this visit.   Subjective:    Patient ID: Kara Nunez, female    DOB: [birth year], 68 y.o.   MRN: [id number]  HPI: Kara Nunez is a 68 y.o. female presenting on 04/24/2019 for No chief complaint on file.  This patient is having a follow-up from her chronic medical conditions.  She is now living in [place].  She does travel down her a lot.  But she would like to have her nephrologist in that area.  And will place a referral to hopefully that [facility] system will have a nephrologist associated with it.  She does have chronic kidney disease.  She is having a lot more depression and crying.  She has been on the Cymbalta for many years.  However she does feel significantly stressed out, and down a good line.  We are going to have her switch over to Lexapro 10 mg 1 daily.  The medication will be sent to pill pack for the next fill.  I am also going to send her pain medication to the pharmacy in [place].  It is canceled at the pharmacy in [place]  Past Medical History:  Diagnosis Date  . Allergic rhinitis   . Ascending aortic aneurysm ([place])    Followed by Dr. [name]  . Chronic back pain   . Coronary atherosclerosis of native coronary artery    Prior evaluation with in [facility] - details not clear   . Degenerative disc disease   . Essential hypertension, benign   . Hyperlipidemia   . Insomnia   . Type 2 diabetes mellitus (HCC)    Relevant past medical, surgical, family and social history reviewed and updated as indicated. Interim medical history since our last visit reviewed. Allergies and medications reviewed and updated. DATA REVIEWED: CHART IN EPIC  Family History reviewed for pertinent findings.    Allergies as of 04/24/2019      Reactions   Codeine Nausea Only   Amoxicillin Rash      Medication List       Accurate as of April 24, 2019  5:40 PM. If you have any questions, ask your nurse or doctor.        STOP  taking these medications   DULoxetine 60 MG capsule Commonly known as: CYMBALTA Stopped by: Terald Sleeper, PA-C     TAKE these medications   albuterol 108 (90 Base) MCG/ACT inhaler Commonly known as: Ventolin HFA USE 2 PUFFS EVERY 6 HOURS AS NEEDED   ALPRAZolam 0.5 MG tablet Commonly known as: XANAX Take 1 tablet (0.5 mg total) by mouth 2 (two) times daily as needed for anxiety.   aspirin EC 81 MG tablet Take 81 mg by mouth daily.   clobetasol cream 0.05 % Commonly known as: TEMOVATE Apply 1 application topically 2 (two) times daily.   cloNIDine 0.2 MG tablet Commonly known as: CATAPRES TAKE ONE (1) TABLET THREE (3) TIMES EACH DAY   donepezil 10 MG tablet Commonly known as: ARICEPT Take 1 tablet (10 mg total) by mouth at bedtime.   escitalopram 10 MG tablet Commonly known as: Lexapro Take 1 tablet (10 mg total) by mouth daily. Started by: Terald Sleeper, PA-C   Exenatide ER 2 MG/0.85ML Auij Commonly known as: Bydureon BCise Inject 2 mg into the skin once a week.   fenofibrate micronized 134 MG capsule Commonly known as: LOFIBRA TAKE 1 CAPSULE EVERY MORNING BEFORE BREAKFAST   fluorouracil 5 % cream Commonly  known as: EFUDEX Apply 1 application topically 2 (two) times daily as needed (Use on face).   furosemide 20 MG tablet Commonly known as: LASIX Take 1 tablet (20 mg total) by mouth daily.   HYDROcodone-acetaminophen 7.5-325 MG tablet Commonly known as: NORCO Take 1 tablet by mouth every 8 (eight) hours as needed for severe pain. What changed: Another medication with the same name was removed. Continue taking this medication, and follow the directions you see here. Changed by: Terald Sleeper, PA-C   HYDROcodone-acetaminophen 7.5-325 MG tablet Commonly known as: NORCO Take 1 tablet by mouth every 8 (eight) hours as needed for severe pain. What changed: Another medication with the same name was removed. Continue taking this medication, and follow the directions  you see here. Changed by: Terald Sleeper, PA-C   IFerex 150 150 MG capsule Generic drug: iron polysaccharides TAKE ONE (1) CAPSULE EACH DAY   levothyroxine 50 MCG tablet Commonly known as: SYNTHROID TAKE ONE TABLET EACH MORNING BEFORE BREAKFAST   losartan 100 MG tablet Commonly known as: COZAAR Take 1 tablet (100 mg total) by mouth daily.   memantine 28 MG Cp24 24 hr capsule Commonly known as: NAMENDA XR TAKE ONE (1) CAPSULE EACH DAY   metoprolol succinate 100 MG 24 hr tablet Commonly known as: TOPROL-XL TAKE ONE (1) TABLET EACH DAY   NIFEdipine 90 MG 24 hr tablet Commonly known as: PROCARDIA XL/NIFEDICAL-XL Take 1 tablet (90 mg total) by mouth daily.   nitroGLYCERIN 0.1 mg/hr patch Commonly known as: NITRODUR - Dosed in mg/24 hr Place 1 patch (0.1 mg total) onto the skin daily.   OLANZapine 5 MG tablet Commonly known as: ZYPREXA Take 1 tablet (5 mg total) by mouth at bedtime.   pantoprazole 40 MG tablet Commonly known as: PROTONIX Take 1 tablet (40 mg total) by mouth 2 (two) times daily.   potassium chloride 10 MEQ tablet Commonly known as: K-DUR Take 1 tablet (10 mEq total) by mouth 2 (two) times daily.   rosuvastatin 20 MG tablet Commonly known as: CRESTOR Take 1 tablet (20 mg total) by mouth daily.   sitaGLIPtin 100 MG tablet Commonly known as: Januvia TAKE ONE (1) TABLET EACH DAY   spironolactone 25 MG tablet Commonly known as: ALDACTONE Take 1 tablet (25 mg total) by mouth daily.   Vitamin D (Ergocalciferol) 1.25 MG (50000 UT) Caps capsule Commonly known as: DRISDOL TAKE 1 CAPSULE TWICE A WEEK   zolpidem 10 MG tablet Commonly known as: AMBIEN Take 1 tablet (10 mg total) by mouth at bedtime as needed.          Objective:    There were no vitals taken for this visit.  Allergies  Allergen Reactions  . Codeine Nausea Only  . Amoxicillin Rash    Wt Readings from Last 3 Encounters:  03/19/19 225 lb (102.1 kg)  10/11/18 219 lb 12.8 oz (99.7  kg)  10/10/18 222 lb (100.7 kg)    Physical Exam  Results for orders placed or performed in visit on 04/13/19  Iron, TIBC and Ferritin Panel  Result Value Ref Range   Iron 58    TIBC 475    UIBC 417    %SAT 12   CBC and differential  Result Value Ref Range   Hemoglobin 14.5 12.0 - 16.0   HCT 44 36 - 46  VITAMIN D 25 Hydroxy (Vit-D Deficiency, Fractures)  Result Value Ref Range   Vit D, 25-Hydroxy 57.2   Basic metabolic panel  Result  Value Ref Range   Glucose 206    BUN 31 (A) 4 - 21   Creatinine 1.9 (A) 0.5 - 1.1      Assessment & Plan:   1. DDD (degenerative disc disease), lumbar - HYDROcodone-acetaminophen (NORCO) 7.5-325 MG tablet; Take 1 tablet by mouth every 8 (eight) hours as needed for severe pain.  Dispense: 90 tablet; Refill: 0 - HYDROcodone-acetaminophen (NORCO) 7.5-325 MG tablet; Take 1 tablet by mouth every 8 (eight) hours as needed for severe pain.  Dispense: 90 tablet; Refill: 0  2. Primary osteoarthritis involving multiple joints - HYDROcodone-acetaminophen (NORCO) 7.5-325 MG tablet; Take 1 tablet by mouth every 8 (eight) hours as needed for severe pain.  Dispense: 90 tablet; Refill: 0 - HYDROcodone-acetaminophen (NORCO) 7.5-325 MG tablet; Take 1 tablet by mouth every 8 (eight) hours as needed for severe pain.  Dispense: 90 tablet; Refill: 0  3. Anxiety - escitalopram (LEXAPRO) 10 MG tablet; Take 1 tablet (10 mg total) by mouth daily.  Dispense: 30 tablet; Refill: 5  4. Essential hypertension Continue medications.  5. Uncontrolled type 2 diabetes mellitus with hyperglycemia (HCC) Continue medications  6. Stage 2 chronic kidney disease - Ambulatory referral to Nephrology  7. GAD (generalized anxiety disorder) Continue medication  8. Depression, recurrent ([place]) - escitalopram (LEXAPRO) 10 MG tablet; Take 1 tablet (10 mg total) by mouth daily.  Dispense: 30 tablet; Refill: 5   Continue all other maintenance medications as listed above.  Follow up  plan: Return in about 4 weeks (around 05/22/2019) for recheck medications.  Educational handout given for [place] [facility] [place] PA-C [place] [address]  [place], [place] [address] [phone]   04/24/2019, 5:40 PM

## 2019-04-24 NOTE — Patient Instructions (Signed)
Visit Information  Goals Addressed            This Visit's Progress     Patient Stated   . "I want to increase my strength" (pt-stated)       Current Barriers:  . Lacks caregiver support.  . Film/video editor.  . Chronic Disease Management support and education needs related to exercises that can be done safely in the home.   Nurse Case Manager Clinical Goal(s):  Marland Kitchen Over the next 7 days, patient will verbalize understanding of plan for physical conditioning and strengthening . Over the next 30 days, patient will work with Consulting civil engineer to address needs related to deconditioning due to deceased activity level  Interventions:  . Advised patient to limit activity outdoors for now due to heat/humidity . Provided education to patient re: exercises that can be done in her apartment . Discussed plans with patient for ongoing care management follow up and provided patient with direct contact information for care management team . Will mail EMMI educational handouts on home exercises  Patient Self Care Activities:  . Performs ADL's independently . Performs IADL's independently  Initial goal documentation     . "I want to keep up with my kidney health" (pt-stated)       Current Barriers:  . Lacks caregiver support.  . Film/video editor.  . Literacy barriers . Chronic Disease Management support and education needs related to CKD.  Nurse Case Manager Clinical Goal(s):  Marland Kitchen Over the next 30 days, patient will work with RN Care Manager to address needs related to chronic kidney disease  Interventions:  . Lab results received and compared to labs from 09/2018. Essentially no change in kidney functions . Results abstracted . Coordinated appt with Dr Lowanda Foster at patient's request.  o Scheduled for telephone visit on 05/01/19 at 9:30 . Provided reassurance and encouraged to continue current treatment . Advised to reach out to [facility] or Dr Florentina Addison office if she has any  concerns . Provided RNCM contact information   Patient Self Care Activities:  . Performs ADL's independently . Performs IADL's independently  Initial goal documentation         The patient verbalized understanding of instructions provided today and declined a print copy of patient instruction materials.    Kara Nunez was given information about Chronic Care Management services today including:  1. CCM service includes personalized support from designated clinical staff supervised by her physician, including individualized plan of care and coordination with other care providers 2. 24/7 contact phone numbers for assistance for urgent and routine care needs. 3. Service will only be billed when office clinical staff spend 20 minutes or more in a month to coordinate care. 4. Only one practitioner may furnish and bill the service in a calendar month. 5. The patient may stop CCM services at any time (effective at the end of the month) by phone call to the office staff. 6. The patient will be responsible for cost sharing (co-pay) of up to 20% of the service fee (after annual deductible is met).  Patient agreed to services and verbal consent obtained.     Plan:  The care management team will reach out to the patient again over the next 2 days.   Chong Sicilian BSN, RN-BC Embedded Chronic Care Manager [facility] Direct Dial: [phone]

## 2019-04-26 ENCOUNTER — Ambulatory Visit (INDEPENDENT_AMBULATORY_CARE_PROVIDER_SITE_OTHER): Payer: Medicare HMO | Admitting: Licensed Clinical Social Worker

## 2019-04-26 ENCOUNTER — Ambulatory Visit: Payer: Self-pay | Admitting: Pharmacist

## 2019-04-26 ENCOUNTER — Other Ambulatory Visit: Payer: Self-pay | Admitting: Pharmacist

## 2019-04-26 DIAGNOSIS — M5136 Other intervertebral disc degeneration, lumbar region: Secondary | ICD-10-CM

## 2019-04-26 DIAGNOSIS — I712 Thoracic aortic aneurysm, without rupture: Secondary | ICD-10-CM

## 2019-04-26 DIAGNOSIS — F419 Anxiety disorder, unspecified: Secondary | ICD-10-CM

## 2019-04-26 DIAGNOSIS — F411 Generalized anxiety disorder: Secondary | ICD-10-CM

## 2019-04-26 DIAGNOSIS — I7121 Aneurysm of the ascending aorta, without rupture: Secondary | ICD-10-CM

## 2019-04-26 DIAGNOSIS — E1165 Type 2 diabetes mellitus with hyperglycemia: Secondary | ICD-10-CM | POA: Diagnosis not present

## 2019-04-26 DIAGNOSIS — N182 Chronic kidney disease, stage 2 (mild): Secondary | ICD-10-CM | POA: Diagnosis not present

## 2019-04-26 NOTE — Patient Instructions (Signed)
Licensed Clinical Social Worker Visit Information  Goals we discussed today:   Goals            . Client wants to talk with LCSW about managing stress/anxiety about health conditions (pt-stated)     Current Barriers:  . Kidney issues . Pain issues (lower back, buttocks back og thighs) .   Clinical Social Work Clinical Goal(s):  . Client and LCSW to talk in next 30 days about client management of anxiety and stress.  Interventions: . Talked with client about CM program . Encouraged clint to talk with RNCM about nursing needs . Talked with [place] about medications . Talked about relaxation techniques  (swimming,reading,talking with neighbor) .   Patient Self Care Activities:   Takes medications as prescribed Attends medical appointments  Plan: Attend scheduled medical appointments Client to talk with [facility] about nursing needs Client to use relaxation activities as able LCSW to call client in 3 weeks to discuss psychosocial needs of client  Initial goal documentation       Materials Provided: No  Follow Up Plan:  LCSW to call client in next 3 weeks to discuss psychosocial needs of client  The patient verbalized understanding of instructions provided today and declined a print copy of patient instruction materials.   [name].Secundino Ellithorpe MSW, LCSW Licensed Clinical Social Worker [facility] Care Management [phone]

## 2019-04-26 NOTE — Chronic Care Management (AMB) (Signed)
  Care Management Note   Kara Nunez is a 68 y.o. year old female who is a primary care patient of Kara Sleeper, PA-C. The CM team was consulted for assistance with chronic disease management and care coordination.   I reached out to Kara Nunez by phone today.   Review of patient status, including review of consultants reports, relevant laboratory and other test results, and collaboration with appropriate care team members and the patient's provider was performed as part of comprehensive patient evaluation and provision of chronic care management services.   Social Determinants of Health: risk of social isolation; risk of depression    Chronic Care Management from 04/06/2019 in [place]  PHQ-9 Total Score  8     GAD 7 : Generalized Anxiety Score 04/06/2019  Nervous, Anxious, on Edge 2  Control/stop worrying 3  Worry too much - different things 3  Trouble relaxing 0  Restless 0  Easily annoyed or irritable 0  Afraid - awful might happen 1  Total GAD 7 Score 9   Goals            . Kara Nunez wants to talk with LCSW about managing stress/anxiety about health conditions (pt-stated)     Current Barriers:  . Kidney issues . Pain issues (lower back, buttocks back og thighs) .   Clinical Social Work Clinical Goal(s):  . Kara Nunez and LCSW to talk in next 30 days about Kara Nunez management of anxiety and stress.  Interventions: . Talked with Kara Nunez about CM program . Encouraged clint to talk with RNCM about nursing needs . Talked with Kara Nunez about medications . Talked about relaxation techniques  (swimming,reading,talking with neighbor) .   Patient Self Care Activities:   Takes medications as prescribed Attends medical appointments  Plan: Attend scheduled medical appointments Kara Nunez to talk with Kara Nunez about nursing needs Kara Nunez to use relaxation activities as able LCSW to call Kara Nunez in 3 weeks to discuss psychosocial needs of Kara Nunez  Initial goal  documentation      Kara Nunez has prescribed medications and is taking medications as prescribed.  Kara Nunez said it is hard sometimes to pay copays for her monthly medications.Kara Nunez spoke of stress issues she faces. Kara Nunez likes to read, talk with friends to relax. She likes swimming when she is able to do so to help her relax. She has reduced family support . LCSW encouraged Kara Nunez to talk with RNCM to talk about nursing needs of Kara Nunez. Kara Nunez resides now in [place] and enjoys her residence.  Kara Nunez said she is looking for a different nephrologist. Kara Nunez spoke of family stress issues. She spoke of her grandson and financial stress issues related to her grandson. She is residing in an apartment now in [place], [place]. She likes to listen to music to help her relax.  LCSW talked with Kara Nunez about relaxation techniques such as deep breathing exercises and talked with Kara Nunez about muscle tensing and relaxation technique as ways to help her relax. She was appreciative of this information. LCSW encouraged Kara Nunez to call LCSW as needed to discuss social work needs of Kara Nunez  Follow Up Plan: LCSW to call Kara Nunez in next 3 weeks to talk about the psychosocial needs of Kara Nunez  Kara Nunez MSW, LCSW Licensed Clinical Social Worker [facility] [phone]

## 2019-04-27 ENCOUNTER — Other Ambulatory Visit: Payer: Self-pay | Admitting: Pharmacy Technician

## 2019-04-27 NOTE — Patient Outreach (Signed)
[facility]) Care Management  04/27/2019  Kara Nunez 08-07-1951 [id number]                          Medication Assistance Referral  Referral From: [facility] Jenne Pane  Medication/Company: [employer]esta Gentile and Proventil HFA / [employer] Patient application portion:  Education officer, museum portion: Magazine features editor to Arlis Porta, PA-C  Medication/Company: Orland Mustard / [place] Patient application portion:  Mailed Provider application portion: Interoffice Mailed to Arlis Porta, PA-C   Follow up:  Will follow up with patient in 7-10 business days to confirm application(s) have been received.  Maud Deed Chana Bode [place] Certified Pharmacy Technician [facility] Management Direct [phone]

## 2019-05-01 DIAGNOSIS — N184 Chronic kidney disease, stage 4 (severe): Secondary | ICD-10-CM | POA: Diagnosis not present

## 2019-05-01 DIAGNOSIS — F0390 Unspecified dementia without behavioral disturbance: Secondary | ICD-10-CM | POA: Diagnosis not present

## 2019-05-04 ENCOUNTER — Telehealth: Payer: Self-pay | Admitting: *Deleted

## 2019-05-04 DIAGNOSIS — F0280 Dementia in other diseases classified elsewhere without behavioral disturbance: Secondary | ICD-10-CM

## 2019-05-04 NOTE — Telephone Encounter (Signed)
FYI Incoming call from Dr Norlene Duel Per Dr, pt needs referral to neurology for early onset dementia Referral pended in Epic

## 2019-05-07 NOTE — Patient Outreach (Signed)
[place] [facility]) Care Management  [place]   05/07/2019  Kara Nunez 01-Aug-1951 [id number]  Reason for referral: Medication Assistance  Referral source: [facility] RN Current insurance: [employer]  PMHx includes but not limited to:  T2DM, HTN, HLD  Outreach:  Successful telephone call with Kara Nunez.  HIPAA identifiers verified.   Current Barriers:  . Financial Barriers: patient has [employer] and reports copays for Januvia & Proventil are cost prohibitive at this time Interventions: . Comprehensive medication review completed; medication list updated in electronic medical record.  . Patient meets income/NOout of pocket spend criteria for this medication's patient assistance program. Reviewed application process. Patient will provide proof of income, out of pocket spend report, and will sign application. Will collaborate with [facility] C[facility], [place] primary care provider, [name] for their portion of application.   Patient Self Care Activities:  . Patient will provide necessary portions of application    Objective: The 10-year ASCVD risk score Mikey Bussing DC Brooke Bonito., et al., 2013) is: 32.7%   Values used to calculate the score:     Age: 68 years     Sex: Female     Is Non-Hispanic African American: No     Diabetic: Yes     Tobacco smoker: No     Systolic Blood Pressure: 053 mmHg     Is BP treated: Yes     HDL Cholesterol: 39 mg/dL     Total Cholesterol: 144 mg/dL  Lab Results  Component Value Date   CREATININE 1.9 (A) 03/20/2019   CREATININE 1.77 (H) 10/11/2018   CREATININE 1.87 (H) 07/04/2018    Lab Results  Component Value Date   HGBA1C 8.1 (H) 03/19/2019    Lipid Panel     Component Value Date/Time   CHOL 144 03/19/2019 1058   TRIG 331 (H) 03/19/2019 1058   HDL 39 (L) 03/19/2019 1058   CHOLHDL 3.7 03/19/2019 1058   LDLCALC 39 03/19/2019 1058    BP Readings from Last 3 Encounters:  03/19/19 (!) 160/85  10/11/18 133/73  10/10/18 (!)  142/86    Allergies  Allergen Reactions  . Codeine Nausea Only  . Amoxicillin Rash    Medications Reviewed Today    Reviewed by Terald Sleeper, PA-C (Physician Assistant) on 04/24/19 at 1041  Med List Status: <None>  Medication Order Taking? Sig Documenting Provider Last Dose Status Informant  albuterol (VENTOLIN HFA) 108 (90 Base) MCG/ACT inhaler [id number] No USE 2 PUFFS EVERY 6 HOURS AS NEEDED Terald Sleeper, PA-C Taking Active   ALPRAZolam Duanne Moron) 0.5 MG tablet [id number] No Take 1 tablet (0.5 mg total) by mouth 2 (two) times daily as needed for anxiety. Terald Sleeper, PA-C Taking Active   aspirin EC 81 MG tablet [id number] No Take 81 mg by mouth daily. [provider] Taking Active Self           Med Note Margit Hanks   Tue Oct 05, 2016  1:15 PM)    clobetasol cream (TEMOVATE) 0.05 % [id number] No Apply 1 application topically 2 (two) times daily. Terald Sleeper, PA-C Taking Active   cloNIDine (CATAPRES) 0.2 MG tablet [id number] No TAKE ONE (1) TABLET THREE (3) TIMES EACH DAY Terald Sleeper, PA-C Taking Active   donepezil (ARICEPT) 10 MG tablet [id number] No Take 1 tablet (10 mg total) by mouth at bedtime. Terald Sleeper, PA-C Taking Active   escitalopram (LEXAPRO) 10 MG tablet [id number]  Take 1 tablet (10  mg total) by mouth daily. Terald Sleeper, PA-C  Active   Exenatide ER (BYDUREON BCISE) 2 MG/0.85ML AUIJ [id number] No Inject 2 mg into the skin once a week. Terald Sleeper, PA-C Taking Active   fenofibrate micronized (LOFIBRA) 134 MG capsule [id number] No TAKE 1 CAPSULE EVERY MORNING BEFORE BREAKFAST Terald Sleeper, PA-C Taking Active   fluorouracil (EFUDEX) 5 % cream [id number] No Apply 1 application topically 2 (two) times daily as needed (Use on face). [provider] Taking Active Self  furosemide (LASIX) 20 MG tablet [id number] No Take 1 tablet (20 mg total) by mouth daily. Terald Sleeper, PA-C Taking Active   HYDROcodone-acetaminophen [facility]) 7.5-325 MG tablet  [id number] No Take 1 tablet by mouth every 8 (eight) hours as needed for severe pain. Terald Sleeper, PA-C Taking Active   HYDROcodone-acetaminophen (NORCO) 7.5-325 MG tablet [id number] No Take 1 tablet by mouth every 8 (eight) hours as needed for severe pain.  Patient not taking: Reported on 04/12/2019   Theodoro Clock Not Taking Active            Med Note LORRI, FUKUHARA   Thu Apr 11, 1760  6:07 PM) Duplicate per patient.   HYDROcodone-acetaminophen (NORCO) 7.5-325 MG tablet [id number] No Take 1 tablet by mouth every 8 (eight) hours as needed for severe pain.  Patient not taking: Reported on 04/12/2019   Theodoro Clock Not Taking Active            Med Note BERNESTINE, HOLSAPPLE   Thu Apr 11, 8545  2:70 PM) Duplicate per patient.   IFEREX 150 150 MG capsule [id number] No TAKE ONE (1) CAPSULE EACH DAY Terald Sleeper, PA-C Taking Active   levothyroxine (SYNTHROID) 50 MCG tablet [id number] No TAKE ONE TABLET EACH MORNING BEFORE BREAKFAST Terald Sleeper, PA-C Taking Active   losartan (COZAAR) 100 MG tablet [id number] No Take 1 tablet (100 mg total) by mouth daily. Terald Sleeper, PA-C Taking Active   memantine (NAMENDA XR) 28 MG CP24 24 hr capsule [id number] No TAKE ONE (1) CAPSULE EACH DAY Terald Sleeper, PA-C Taking Active   metoprolol succinate (TOPROL-XL) 100 MG 24 hr tablet [id number] No TAKE ONE (1) TABLET EACH DAY Terald Sleeper, PA-C Taking Active   NIFEdipine (PROCARDIA XL/NIFEDICAL-XL) 90 MG 24 hr tablet [id number] No Take 1 tablet (90 mg total) by mouth daily. Terald Sleeper, PA-C Taking Active   nitroGLYCERIN (NITRODUR - DOSED IN MG/24 HR) 0.1 mg/hr patch [id number] No Place 1 patch (0.1 mg total) onto the skin daily. Terald Sleeper, PA-C Taking Active   OLANZapine (ZYPREXA) 5 MG tablet [id number] No Take 1 tablet (5 mg total) by mouth at bedtime. Terald Sleeper, PA-C Taking Active   pantoprazole (PROTONIX) 40 MG tablet [id number] No Take 1 tablet (40 mg total) by mouth 2 (two) times daily.  Terald Sleeper, PA-C Taking Active   potassium chloride (K-DUR) 10 MEQ tablet [id number] No Take 1 tablet (10 mEq total) by mouth 2 (two) times daily. Terald Sleeper, PA-C Taking Active   rosuvastatin (CRESTOR) 20 MG tablet [id number] No Take 1 tablet (20 mg total) by mouth daily. Terald Sleeper, PA-C Taking Active   sitaGLIPtin (JANUVIA) 100 MG tablet [id number] No TAKE ONE (1) TABLET EACH DAY Terald Sleeper, PA-C Taking Active   spironolactone (ALDACTONE) 25 MG tablet [id number] No Take 1 tablet (25 mg total) by mouth daily. Terald Sleeper, PA-C Taking Active  Vitamin D, Ergocalciferol, (DRISDOL) 1.25 MG (50000 UT) CAPS capsule [id number] No TAKE 1 CAPSULE TWICE A WEEK Jones, Angel S, PA-C Taking Active   zolpidem (AMBIEN) 10 MG tablet [id number] No Take 1 tablet (10 mg total) by mouth at bedtime as needed. Terald Sleeper, PA-C Taking Active   Med List Note Holley Dexter 02/03/18 1426): Metform<12/20/18Pt uses CPAP Machine          Medication Assistance Findings:   Patient Assistance Programs: Januvia & Proventil  made by [employer] o Income requirement met: Yes o Out-of-pocket prescription expenditure met:   Not Applicable - Patient has met application requirements to apply for this program.     Plan: . I will route patient assistance letter to [place] technician who will coordinate patient assistance program application process for medications listed above.  [facility] pharmacy technician will assist with obtaining all required documents from both patient and provider(s) and submit application(s) once completed.   . I will f/u with patient next month   Regina Eck, PharmD, [place]  [phone]

## 2019-05-10 ENCOUNTER — Ambulatory Visit: Payer: Self-pay | Admitting: Pharmacist

## 2019-05-14 ENCOUNTER — Other Ambulatory Visit: Payer: Self-pay | Admitting: Thoracic Surgery (Cardiothoracic Vascular Surgery)

## 2019-05-14 DIAGNOSIS — I7121 Aneurysm of the ascending aorta, without rupture: Secondary | ICD-10-CM

## 2019-05-14 DIAGNOSIS — I712 Thoracic aortic aneurysm, without rupture, unspecified: Secondary | ICD-10-CM

## 2019-05-15 ENCOUNTER — Ambulatory Visit: Payer: Medicare HMO | Admitting: Thoracic Surgery (Cardiothoracic Vascular Surgery)

## 2019-05-16 ENCOUNTER — Other Ambulatory Visit: Payer: Self-pay | Admitting: Pharmacy Technician

## 2019-05-16 NOTE — Patient Outreach (Signed)
[place] [facility]) Care Management  05/16/2019  LENKA ZHAO 02-Jun-1951 [id number]    Successful call placed to patient regarding patient assistance application(s) for Bydureon, Januvia and Proventil HFA , HIPAA identifiers verified. Ms. Hisle confirms she received applications and once she has received her OOP spend printout from Pharmacy she will back into me.  Follow up:  Will submit applications to companies once all documents have been obtained.  Maud Deed Chana Bode [place] Certified Pharmacy Technician [place] Management Direct Dial:[phone]

## 2019-05-18 ENCOUNTER — Ambulatory Visit (INDEPENDENT_AMBULATORY_CARE_PROVIDER_SITE_OTHER): Payer: Medicare HMO | Admitting: Licensed Clinical Social Worker

## 2019-05-18 DIAGNOSIS — I7121 Aneurysm of the ascending aorta, without rupture: Secondary | ICD-10-CM

## 2019-05-18 DIAGNOSIS — N182 Chronic kidney disease, stage 2 (mild): Secondary | ICD-10-CM

## 2019-05-18 DIAGNOSIS — E1165 Type 2 diabetes mellitus with hyperglycemia: Secondary | ICD-10-CM | POA: Diagnosis not present

## 2019-05-18 DIAGNOSIS — F411 Generalized anxiety disorder: Secondary | ICD-10-CM

## 2019-05-18 DIAGNOSIS — M5136 Other intervertebral disc degeneration, lumbar region: Secondary | ICD-10-CM

## 2019-05-18 DIAGNOSIS — I712 Thoracic aortic aneurysm, without rupture: Secondary | ICD-10-CM

## 2019-05-18 DIAGNOSIS — F419 Anxiety disorder, unspecified: Secondary | ICD-10-CM

## 2019-05-18 NOTE — Patient Instructions (Signed)
Licensed Clinical Social Worker Visit Information  Goals we discussed today:  Goals    . Client wants to talk with LCSW about managing stress/anxiety about health conditions (pt-stated)     Current Barriers:  . Kidney issues . Pain issues (lower back, buttocks back og thighs) .   Clinical Social Work Clinical Goal(s):  . Client and LCSW to talk in next 30 days about client management of anxiety and stress.  Interventions: . Talked with client about CM program . Encouraged clint to talk with RNCM about nursing needs . Talked with Jett about medications . Talked about relaxation techniques  (swimming,reading,talking with neighbor) .   Patient Self Care Activities:   Takes medications as prescribed Attends medical appointments  Plan: Attend scheduled medical appointments Client to talk with [facility] about nursing needs Client to use relation activities as able LCSW to call client in 3 weeks to discuss psychosocial needs of client  Initial goal documentation         Materials Provided:  No  Follow Up Plan: LCSW to call client in next 3 weeks to discuss the psychosocial needs of client.   The patient verbalized understanding of instructions provided today and declined a print copy of patient instruction materials.   [name].Karisma Meiser MSW, LCSW Licensed Clinical Social Worker [facility] Care Management [phone]

## 2019-05-18 NOTE — Chronic Care Management (AMB) (Signed)
Care Management Note   Kara Nunez is a 68 y.o. year old female who is a primary care patient of Terald Sleeper, PA-C. The CM team was consulted for assistance with chronic disease management and care coordination.   I reached out to [name] by phone today.   Review of patient status, including review of consultants reports, relevant laboratory and other test results, and collaboration with appropriate care team members and the patient's provider was performed as part of comprehensive patient evaluation and provision of chronic care management services.   Social Determinants of Health: risk of social isolation; risk of depression    Chronic Care Management from 04/06/2019 in [place]  PHQ-9 Total Score  8     GAD 7 : Generalized Anxiety Score 04/06/2019  Nervous, Anxious, on Edge 2  Control/stop worrying 3  Worry too much - different things 3  Trouble relaxing 0  Restless 0  Easily annoyed or irritable 0  Afraid - awful might happen 1  Total GAD 7 Score 9   Medications   New medications from outside sources are available for reconciliation   albuterol (VENTOLIN HFA) 108 (90 Base) MCG/ACT inhaler    ALPRAZolam (XANAX) 0.5 MG tablet    aspirin EC 81 MG tablet    clobetasol cream (TEMOVATE) 0.05 %    cloNIDine (CATAPRES) 0.2 MG tablet    donepezil (ARICEPT) 10 MG tablet    escitalopram (LEXAPRO) 10 MG tablet    Exenatide ER (BYDUREON BCISE) 2 MG/0.85ML AUIJ    fenofibrate micronized (LOFIBRA) 134 MG capsule    fluorouracil (EFUDEX) 5 % cream    furosemide (LASIX) 20 MG tablet    HYDROcodone-acetaminophen (NORCO) 7.5-325 MG tablet    HYDROcodone-acetaminophen (NORCO) 7.5-325 MG tablet    IFEREX 150 150 MG capsule    levothyroxine (SYNTHROID) 50 MCG tablet    losartan (COZAAR) 100 MG tablet    memantine (NAMENDA XR) 28 MG CP24 24 hr capsule    metoprolol succinate (TOPROL-XL) 100 MG 24 hr tablet    NIFEdipine (PROCARDIA XL/NIFEDICAL-XL) 90  MG 24 hr tablet    nitroGLYCERIN (NITRODUR - DOSED IN MG/24 HR) 0.1 mg/hr patch    OLANZapine (ZYPREXA) 5 MG tablet    pantoprazole (PROTONIX) 40 MG tablet    potassium chloride (K-DUR) 10 MEQ tablet    rosuvastatin (CRESTOR) 20 MG tablet    sitaGLIPtin (JANUVIA) 100 MG tablet    spironolactone (ALDACTONE) 25 MG tablet    Vitamin D, Ergocalciferol, (DRISDOL) 1.25 MG (50000 UT) CAPS capsule    zolpidem (AMBIEN) 10 MG tablet     Goals        . Client wants to talk with LCSW about managing stress/anxiety about health conditions (pt-stated)     Current Barriers:  . Kidney issues . Pain issues (lower back, buttocks back og thighs)  Clinical Social Work Clinical Goal(s):  . Client and LCSW to talk in next 30 days about client management of anxiety and stress.  Interventions: . Talked with client about CM program . Encouraged clint to talk with RNCM about nursing needs . Talked with Montie about medications . Talked about relaxation techniques  (swimming,reading,talking with neighbor)  Patient Self Care Activities:   Takes medications as prescribed Attends medical appointments  Plan: Attend scheduled medical appointments Client to talk with [facility] about nursing needs Client to use relation activities as able LCSW to call client in 3 weeks to discuss psychosocial needs of client  Initial  goal documentation       Client has prescribed medications and is taking medications as prescribed.  Client said it is hard sometimes to pay copays for her monthly medications.Client spoke of stress issues she faces. Client likes to read, talk with friends to relax. She likes swimming when she is able to do so to help her relax. She said she has an appointment to go swimming tomorrow and is looking forward to going swimming again. She has reduced family support . LCSW encouraged client to talk with RNCM to talk about nursing needs of client. Client resides now in [place] and enjoys her  residence.  Client said she is looking for a different nephrologist. Client spoke of family stress issues. She likes to listen to music to help her relax.  LCSW talked with client about relaxation techniques such as deep breathing exercises and talked with client about muscle tensing and relaxation technique as ways to help her relax. She was appreciative of this information. LCSW encouraged client to call LCSW as needed to discuss social work needs of client.  Follow Up Plan:  LCSW to call client in next 3 weeks to talk with client about the psychosocial needs of client  [name].Abygail Galeno MSW, LCSW Licensed Clinical Social Worker [facility] Care Management [phone]

## 2019-05-22 ENCOUNTER — Encounter: Payer: Self-pay | Admitting: Thoracic Surgery (Cardiothoracic Vascular Surgery)

## 2019-05-22 ENCOUNTER — Ambulatory Visit (INDEPENDENT_AMBULATORY_CARE_PROVIDER_SITE_OTHER): Payer: Medicare HMO | Admitting: Thoracic Surgery (Cardiothoracic Vascular Surgery)

## 2019-05-22 ENCOUNTER — Ambulatory Visit
Admission: RE | Admit: 2019-05-22 | Discharge: 2019-05-22 | Disposition: A | Discharge status: Home / Self Care | Payer: Medicare HMO | Source: Ambulatory Visit | Attending: Thoracic Surgery (Cardiothoracic Vascular Surgery) | Admitting: Thoracic Surgery (Cardiothoracic Vascular Surgery)

## 2019-05-22 ENCOUNTER — Other Ambulatory Visit: Payer: Self-pay

## 2019-05-22 VITALS — BP 143/82 | HR 76 | Temp 97.7°F | Resp 16 | Ht 64.0 in | Wt 228.0 lb

## 2019-05-22 DIAGNOSIS — I7121 Aneurysm of the ascending aorta, without rupture: Secondary | ICD-10-CM

## 2019-05-22 DIAGNOSIS — I712 Thoracic aortic aneurysm, without rupture, unspecified: Secondary | ICD-10-CM

## 2019-05-22 NOTE — Progress Notes (Signed)
[address]       [place] [address]             [phone]     HPI: Mrs. Wardrop returns for a scheduled follow-up visit  Chantelle Verdi is a 68 year old woman with a past medical history significant for thoracic aortic atherosclerosis, ascending aneurysm, coronary atherosclerosis, hypertension, hyperlipidemia, type 2 diabetes, degenerative disc disease, chronic back pain, and obesity.  She was found to have an ascending aneurysm and 2012.  It was 4.4 cm at that time.  She is been followed since then with minimal change over time.  She has gained weight due to COVID-19.  Because of her chronic back pain the only exercise she can tolerate is swimming.  She has been unable to swim due to the [facility] being closed down.  That has now reopened and she has started swimming again.  She recently started seeing a new nephrologist and is very happy with that.  She does check her blood pressure at home and is usually below 390 systolic. Past Medical History:  Diagnosis Date  . Allergic rhinitis   . Ascending aortic aneurysm ([place])    Followed by Dr. [name]  . Chronic back pain   . Coronary atherosclerosis of native coronary artery    Prior evaluation with in [facility] - details not clear   . Degenerative disc disease   . Essential hypertension, benign   . Hyperlipidemia   . Insomnia   . Type 2 diabetes mellitus ([place])     Current Outpatient Medications  Medication Sig Dispense Refill  . albuterol (VENTOLIN HFA) 108 (90 Base) MCG/ACT inhaler USE 2 PUFFS EVERY 6 HOURS AS NEEDED 54 g 3  . ALPRAZolam (XANAX) 0.5 MG tablet Take 1 tablet (0.5 mg total) by mouth 2 (two) times daily as needed for anxiety. 180 tablet 0  . aspirin EC 81 MG tablet Take 81 mg by mouth daily.    . clobetasol cream (TEMOVATE) 3.00 % Apply 1 application topically 2 (two) times daily. 60 g 2  . cloNIDine (CATAPRES) 0.2 MG tablet TAKE ONE (1) TABLET THREE (3) TIMES EACH DAY 270 tablet 1  .  donepezil (ARICEPT) 10 MG tablet Take 1 tablet (10 mg total) by mouth at bedtime. 90 tablet 1  . escitalopram (LEXAPRO) 10 MG tablet Take 1 tablet (10 mg total) by mouth daily. 30 tablet 5  . Exenatide ER (BYDUREON BCISE) 2 MG/0.85ML AUIJ Inject 2 mg into the skin once a week. 3.4 mL 5  . fluorouracil (EFUDEX) 5 % cream Apply 1 application topically 2 (two) times daily as needed (Use on face).    . furosemide (LASIX) 20 MG tablet Take 1 tablet (20 mg total) by mouth daily. 90 tablet 1  . HYDROcodone-acetaminophen (NORCO) 7.5-325 MG tablet Take 1 tablet by mouth every 8 (eight) hours as needed for severe pain. 90 tablet 0  . HYDROcodone-acetaminophen (NORCO) 7.5-325 MG tablet Take 1 tablet by mouth every 8 (eight) hours as needed for severe pain. 90 tablet 0  . IFEREX 150 150 MG capsule TAKE ONE (1) CAPSULE EACH DAY 90 capsule 3  . levothyroxine (SYNTHROID) 50 MCG tablet TAKE ONE TABLET EACH MORNING BEFORE BREAKFAST 90 tablet 3  . losartan (COZAAR) 100 MG tablet Take 1 tablet (100 mg total) by mouth daily. 90 tablet 3  . memantine (NAMENDA XR) 28 MG CP24 24 hr capsule TAKE ONE (1) CAPSULE EACH DAY 90 capsule 1  . metoprolol  succinate (TOPROL-XL) 100 MG 24 hr tablet TAKE ONE (1) TABLET EACH DAY 90 tablet 1  . NIFEdipine (PROCARDIA XL/NIFEDICAL-XL) 90 MG 24 hr tablet Take 1 tablet (90 mg total) by mouth daily. 90 tablet 3  . nitroGLYCERIN (NITRODUR - DOSED IN MG/24 HR) 0.1 mg/hr patch Place 1 patch (0.1 mg total) onto the skin daily. 90 patch 3  . OLANZapine (ZYPREXA) 5 MG tablet Take 1 tablet (5 mg total) by mouth at bedtime. 90 tablet 3  . pantoprazole (PROTONIX) 40 MG tablet Take 1 tablet (40 mg total) by mouth 2 (two) times daily. 180 tablet 1  . potassium chloride (K-DUR) 10 MEQ tablet Take 1 tablet (10 mEq total) by mouth 2 (two) times daily. 180 tablet 1  . rosuvastatin (CRESTOR) 20 MG tablet Take 1 tablet (20 mg total) by mouth daily. 90 tablet 3  . sitaGLIPtin (JANUVIA) 100 MG tablet TAKE ONE  (1) TABLET EACH DAY 90 tablet 3  . spironolactone (ALDACTONE) 25 MG tablet Take 1 tablet (25 mg total) by mouth daily. 90 tablet 1  . Vitamin D, Ergocalciferol, (DRISDOL) 1.25 MG (50000 UT) CAPS capsule TAKE 1 CAPSULE TWICE A WEEK 24 capsule 3  . zolpidem (AMBIEN) 10 MG tablet Take 1 tablet (10 mg total) by mouth at bedtime as needed. 90 tablet 1   No current facility-administered medications for this visit.     Physical Exam BP (!) 143/82 (BP Location: Right Arm, Patient Position: Sitting, Cuff Size: Large)   Pulse 76   Temp 97.7 F (36.5 C)   Resp 16   Ht 5\' 4"  (1.626 m)   Wt 228 lb (103.4 kg)   SpO2 92% Comment: RA  BMI 39.18 kg/m  68 year old woman in no acute distress Alert and oriented x3 with no focal deficits Obese No carotid bruits Cardiac regular rate and rhythm with normal S1 and S2, no murmur Lungs clear bilaterally  Diagnostic Tests: CT CHEST WITHOUT CONTRAST  TECHNIQUE: Multidetector CT imaging of the chest was performed following the standard protocol without IV contrast.  COMPARISON:  CT scan of October 04, 2017.  FINDINGS: Cardiovascular: Grossly stable 4.5 cm ascending thoracic aortic aneurysm is noted. Atherosclerosis of thoracic aorta is noted. Normal cardiac size. Minimal pericardial effusion is noted. Mild coronary artery calcifications are noted.  Mediastinum/Nodes: No enlarged mediastinal or axillary lymph nodes. Thyroid gland, trachea, and esophagus demonstrate no significant findings.  Lungs/Pleura: Lungs are clear. No pleural effusion or pneumothorax.  Upper Abdomen: No acute abnormality.  Musculoskeletal: No chest wall mass or suspicious bone lesions identified.  IMPRESSION: Grossly stable 4.5 cm ascending thoracic aortic aneurysm. Recommend semi-annual imaging followup by CTA or MRA and referral to cardiothoracic surgery if not already obtained. This recommendation follows 2010 ACCF/AHA/AATS/ACR/ASA/SCA/SCAI/SIR/STS/SVM  Guidelines for the Diagnosis and Management of Patients With Thoracic Aortic Disease. Circulation. 2010; 121: Z124-P809. Aortic aneurysm NOS (ICD10-I71.9).  Mild coronary calcifications are noted.  Aortic Atherosclerosis (ICD10-I70.0).   Electronically Signed   By: Marijo Conception M.D.   On: 05/22/2019 14:34 I personally reviewed the CT images and concur with the findings noted above  Impression: Kara Nunez is a 68 year old woman with a history of atherosclerotic cardiovascular disease including thoracic aortic atherosclerosis, ascending aortic aneurysm, coronary atherosclerosis, hypertension, hyperlipidemia, type 2 diabetes, and stage IV chronic kidney disease.  She was first found to have an ascending aneurysm in 2012.  She has been followed since that time.  Ascending aneurysm-stable at 4.5 cm, needs continued semiannual follow-up.  Hypertension-blood pressure mildly  elevated today.  Being managed by nephrology.  No medication changes made today.  She understands importance of blood pressure control and does monitor at home.  Stage IV chronic kidney disease-being followed by nephrology.  Plan: Return in 6 months with CT of chest.  No contrast due to stage IV chronic kidney disease.  Melrose Nakayama, MD [facility] [phone]

## 2019-05-28 ENCOUNTER — Other Ambulatory Visit: Payer: Self-pay | Admitting: Pharmacy Technician

## 2019-05-28 ENCOUNTER — Ambulatory Visit: Payer: Medicare HMO | Admitting: Physician Assistant

## 2019-05-28 NOTE — Patient Outreach (Signed)
[facility] [facility]) Care Management  05/28/2019  Kara Nunez July 25, 1951 [id number]   Received message from [employer] stating patient had left message inquiring about mailing address to mail applications back too.  Unsuccessful call placed to patient, HIPAA compliant voicemail left.  Will make additional call in 2-3 business days if call has not been returned.  Maud Deed Chana Bode [place] Certified Pharmacy Technician [place] Management Direct [phone]

## 2019-05-30 ENCOUNTER — Other Ambulatory Visit: Payer: Self-pay | Admitting: Pharmacy Technician

## 2019-05-30 NOTE — Patient Outreach (Signed)
[place] [facility]) Care Management  05/30/2019  MAKENDRA VIGEANT [birth year] [id number]   Received patient portion(s) of patient assistance application(s) for Bydureon B-Cise, Januvia and Proventil HFA.   While reviewing patients documents I noticed that patient has Full Low Income Subsidy which makes patient in-eligible for patient assistance thru [employer] and [employer].   Cost saving option for patient would to receive 3 month supplies of medications at a time because co-pays would be the same as 1 month supply.  Will route note to [place] to inform and for patient assistance case closure.  Maud Deed Chana Bode [place] Certified Pharmacy Technician [place] Management Direct Dial:[phone]

## 2019-06-02 ENCOUNTER — Other Ambulatory Visit: Payer: Self-pay | Admitting: Physician Assistant

## 2019-06-02 DIAGNOSIS — F419 Anxiety disorder, unspecified: Secondary | ICD-10-CM

## 2019-06-05 ENCOUNTER — Other Ambulatory Visit: Payer: Self-pay | Admitting: Physician Assistant

## 2019-06-05 DIAGNOSIS — F419 Anxiety disorder, unspecified: Secondary | ICD-10-CM

## 2019-06-06 MED ORDER — ALPRAZOLAM 0.5 MG PO TABS: 0.5000 mg | ORAL_TABLET | Freq: Two times a day (BID) | ORAL | 0 refills | Status: DC | PRN

## 2019-06-13 ENCOUNTER — Ambulatory Visit: Payer: Self-pay | Admitting: Licensed Clinical Social Worker

## 2019-06-13 DIAGNOSIS — F411 Generalized anxiety disorder: Secondary | ICD-10-CM

## 2019-06-13 DIAGNOSIS — E1165 Type 2 diabetes mellitus with hyperglycemia: Secondary | ICD-10-CM

## 2019-06-13 DIAGNOSIS — N182 Chronic kidney disease, stage 2 (mild): Secondary | ICD-10-CM

## 2019-06-13 DIAGNOSIS — M5136 Other intervertebral disc degeneration, lumbar region: Secondary | ICD-10-CM

## 2019-06-13 NOTE — Chronic Care Management (AMB) (Signed)
Care Management Note   Kara Nunez is a 68 y.o. year old female who is a primary care patient of Terald Sleeper, PA-C. The CM team was consulted for assistance with chronic disease management and care coordination.   I reached out to [name] by phone today.   Review of patient status, including review of consultants reports, relevant laboratory and other test results, and collaboration with appropriate care team members and the patient's provider was performed as part of comprehensive patient evaluation and provision of chronic care management services.   Social Determinants of Health: risk for depression; risk for social isolation    Chronic Care Management from 04/06/2019 in [place]  PHQ-9 Total Score  8     GAD 7 : Generalized Anxiety Score 04/06/2019  Nervous, Anxious, on Edge 2  Control/stop worrying 3  Worry too much - different things 3  Trouble relaxing 0  Restless 0  Easily annoyed or irritable 0  Afraid - awful might happen 1  Total GAD 7 Score 9   Medications   New medications from outside sources are available for reconciliation   albuterol (VENTOLIN HFA) 108 (90 Base) MCG/ACT inhaler    ALPRAZolam (XANAX) 0.5 MG tablet    aspirin EC 81 MG tablet    clobetasol cream (TEMOVATE) 0.05 %    cloNIDine (CATAPRES) 0.2 MG tablet    donepezil (ARICEPT) 10 MG tablet    escitalopram (LEXAPRO) 10 MG tablet    Exenatide ER (BYDUREON BCISE) 2 MG/0.85ML AUIJ    fluorouracil (EFUDEX) 5 % cream    furosemide (LASIX) 20 MG tablet    HYDROcodone-acetaminophen (NORCO) 7.5-325 MG tablet    HYDROcodone-acetaminophen (NORCO) 7.5-325 MG tablet    IFEREX 150 150 MG capsule    levothyroxine (SYNTHROID) 50 MCG tablet    losartan (COZAAR) 100 MG tablet    memantine (NAMENDA XR) 28 MG CP24 24 hr capsule    metoprolol succinate (TOPROL-XL) 100 MG 24 hr tablet    NIFEdipine (PROCARDIA XL/NIFEDICAL-XL) 90 MG 24 hr tablet    nitroGLYCERIN (NITRODUR - DOSED  IN MG/24 HR) 0.1 mg/hr patch    OLANZapine (ZYPREXA) 5 MG tablet    pantoprazole (PROTONIX) 40 MG tablet    potassium chloride (K-DUR) 10 MEQ tablet    rosuvastatin (CRESTOR) 20 MG tablet    sitaGLIPtin (JANUVIA) 100 MG tablet    spironolactone (ALDACTONE) 25 MG tablet    Vitamin D, Ergocalciferol, (DRISDOL) 1.25 MG (50000 UT) CAPS capsule    zolpidem (AMBIEN) 10 MG tablet     Goals        . Client wants to talk with LCSW about managing stress/anxiety about health conditions (pt-stated)     Current Barriers:  . Kidney issues . Pain issues (lower back, buttocks back og thighs) .   Clinical Social Work Clinical Goal(s):  . Client and LCSW to talk in next 30 days about client management of anxiety and stress.  Interventions: . Talked with client about CM program . Encouraged clint to talk with RNCM about nursing needs . Talked with Estephany about medications . Talked about relaxation techniques  (swimming,reading,talking with neighbor) . Talked with client about her weekly exercise at [facility] (swimming)  Patient Self Care Activities:   Takes medications as prescribed Attends medical appointments  Plan: Attend scheduled medical appointments Client to talk with [facility] about nursing needs Client to use relation activities as able LCSW to call client in 3 weeks to discuss psychosocial needs  of client  Initial goal documentation       Follow Up Plan: LCSW to call client in 3 weeks to discuss the psychosocial needs of client  [name].Adrinne Sze MSW, LCSW Licensed Clinical Social Worker [facility] Care [facility] [phone]

## 2019-06-13 NOTE — Patient Instructions (Signed)
Licensed Clinical Social Worker Visit Information  Goals we discussed today:         . Client wants to talk with LCSW about managing stress/anxiety about health conditions (pt-stated)     Current Barriers:  . Kidney issues . Pain issues (lower back, buttocks back og thighs) .   Clinical Social Work Clinical Goal(s):  . Client and LCSW to talk in next 30 days about client management of anxiety and stress.  Interventions: . Talked with client about CM program . Encouraged clint to talk with RNCM about nursing needs . Talked with Ghada about medications . Talked about relaxation techniques  (swimming,reading,talking with neighbor) . Talked with client about client exercise program (she enjoys swimming at [employer])  Patient Self Care Activities:   Takes medications as prescribed Attends medical appointments  Plan: Attend scheduled medical appointments Client to talk with [facility] about nursing needs Client to use relation activities as able LCSW to call client in 3 weeks to discuss psychosocial needs of client  Initial goal documentation        Materials Provided: No  Follow Up Plan: LCSW to call client in next 3 weeks to discuss the psychosocial needs of client  The patient verbalized understanding of instructions provided today and declined a print copy of patient instruction materials.   [name].Aelyn Stanaland MSW, LCSW Licensed Clinical Social Worker [facility] [phone]

## 2019-06-14 ENCOUNTER — Other Ambulatory Visit: Payer: Self-pay | Admitting: Pharmacist

## 2019-06-15 ENCOUNTER — Other Ambulatory Visit: Payer: Self-pay | Admitting: Physician Assistant

## 2019-06-15 ENCOUNTER — Other Ambulatory Visit: Payer: Self-pay

## 2019-06-15 DIAGNOSIS — R600 Localized edema: Secondary | ICD-10-CM

## 2019-06-15 DIAGNOSIS — F419 Anxiety disorder, unspecified: Secondary | ICD-10-CM

## 2019-06-15 DIAGNOSIS — I10 Essential (primary) hypertension: Secondary | ICD-10-CM

## 2019-06-15 DIAGNOSIS — R413 Other amnesia: Secondary | ICD-10-CM

## 2019-06-15 DIAGNOSIS — E1165 Type 2 diabetes mellitus with hyperglycemia: Secondary | ICD-10-CM

## 2019-06-15 DIAGNOSIS — F339 Major depressive disorder, recurrent, unspecified: Secondary | ICD-10-CM

## 2019-06-15 MED ORDER — NIFEDIPINE ER OSMOTIC RELEASE 90 MG PO TB24: 90.0000 mg | ORAL_TABLET | Freq: Every day | ORAL | 3 refills | Status: DC

## 2019-06-15 MED ORDER — BYDUREON BCISE 2 MG/0.85ML ~~LOC~~ AUIJ: 2.0000 mg | AUTO-INJECTOR | SUBCUTANEOUS | 3 refills | Status: DC

## 2019-06-15 MED ORDER — LEVOTHYROXINE SODIUM 50 MCG PO TABS: ORAL_TABLET | ORAL | 3 refills | Status: DC

## 2019-06-15 MED ORDER — VITAMIN D (ERGOCALCIFEROL) 1.25 MG (50000 UNIT) PO CAPS: ORAL_CAPSULE | ORAL | 3 refills | Status: DC

## 2019-06-15 MED ORDER — METOPROLOL SUCCINATE ER 100 MG PO TB24: ORAL_TABLET | ORAL | 1 refills | Status: DC

## 2019-06-15 MED ORDER — DONEPEZIL HCL 10 MG PO TABS: 10.0000 mg | ORAL_TABLET | Freq: Every day | ORAL | 1 refills | Status: DC

## 2019-06-15 MED ORDER — POTASSIUM CHLORIDE ER 10 MEQ PO TBCR: 10.0000 meq | EXTENDED_RELEASE_TABLET | Freq: Two times a day (BID) | ORAL | 1 refills | Status: DC

## 2019-06-15 MED ORDER — ESCITALOPRAM OXALATE 10 MG PO TABS: 10.0000 mg | ORAL_TABLET | Freq: Every day | ORAL | 1 refills | Status: DC

## 2019-06-15 MED ORDER — LOSARTAN POTASSIUM 100 MG PO TABS: 100.0000 mg | ORAL_TABLET | Freq: Every day | ORAL | 3 refills | Status: DC

## 2019-06-15 MED ORDER — ROSUVASTATIN CALCIUM 20 MG PO TABS: 20.0000 mg | ORAL_TABLET | Freq: Every day | ORAL | 3 refills | Status: DC

## 2019-06-15 MED ORDER — ALBUTEROL SULFATE HFA 108 (90 BASE) MCG/ACT IN AERS: INHALATION_SPRAY | RESPIRATORY_TRACT | 3 refills | Status: DC

## 2019-06-15 MED ORDER — NITROGLYCERIN 0.1 MG/HR TD PT24: 0.1000 mg | MEDICATED_PATCH | Freq: Every day | TRANSDERMAL | 3 refills | Status: DC

## 2019-06-15 MED ORDER — FUROSEMIDE 20 MG PO TABS: 20.0000 mg | ORAL_TABLET | Freq: Every day | ORAL | 1 refills | Status: DC

## 2019-06-15 MED ORDER — SITAGLIPTIN PHOSPHATE 100 MG PO TABS: ORAL_TABLET | ORAL | 3 refills | Status: DC

## 2019-06-15 MED ORDER — PANTOPRAZOLE SODIUM 40 MG PO TBEC: 40.0000 mg | DELAYED_RELEASE_TABLET | Freq: Two times a day (BID) | ORAL | 1 refills | Status: DC

## 2019-06-15 MED ORDER — CLONIDINE HCL 0.2 MG PO TABS: ORAL_TABLET | ORAL | 1 refills | Status: DC

## 2019-06-15 MED ORDER — MEMANTINE HCL ER 28 MG PO CP24: ORAL_CAPSULE | ORAL | 1 refills | Status: DC

## 2019-06-15 MED ORDER — SPIRONOLACTONE 25 MG PO TABS: 25.0000 mg | ORAL_TABLET | Freq: Every day | ORAL | 1 refills | Status: DC

## 2019-06-15 MED ORDER — OLANZAPINE 5 MG PO TABS: 5.0000 mg | ORAL_TABLET | Freq: Every day | ORAL | 3 refills | Status: DC

## 2019-06-15 MED ORDER — ZOLPIDEM TARTRATE 10 MG PO TABS: 10.0000 mg | ORAL_TABLET | Freq: Every evening | ORAL | 1 refills | Status: DC | PRN

## 2019-06-15 NOTE — Patient Outreach (Signed)
[facility]) Care Management [place]  06/15/2019  Kara Nunez 1951-06-29 [id number]  Per [facility] CPhT, Etter Sjogren, "While reviewing patients documents I noticed that patient has Full Low Income Subsidy which makes patient in-eligible for patient assistance thru [employer] and [employer].  Cost saving option for patient would to receive 3 month supplies of medications at a time because co-pays would be the same as 1 month supply."  Patient unable to use Pill Pack service through [facility] as they only fill 1 month supplies.  Patient in agreement to switch to [facility] in [place].  Message route to PCP, [name], for medication refills.  Patient has enough medication to last until 07/10/19.  PLAN: I will follow up in 2 weeks   Regina Eck, PharmD, [place]  [phone]

## 2019-06-18 ENCOUNTER — Encounter: Payer: Self-pay | Admitting: Physician Assistant

## 2019-06-18 ENCOUNTER — Other Ambulatory Visit: Payer: Self-pay

## 2019-06-18 ENCOUNTER — Other Ambulatory Visit: Payer: Self-pay | Admitting: Pharmacist

## 2019-06-18 ENCOUNTER — Ambulatory Visit (INDEPENDENT_AMBULATORY_CARE_PROVIDER_SITE_OTHER): Payer: Medicare HMO | Admitting: Physician Assistant

## 2019-06-18 VITALS — BP 91/61 | HR 73 | Temp 98.2°F | Ht 64.0 in | Wt 228.6 lb

## 2019-06-18 DIAGNOSIS — R198 Other specified symptoms and signs involving the digestive system and abdomen: Secondary | ICD-10-CM | POA: Diagnosis not present

## 2019-06-18 DIAGNOSIS — Z86010 Personal history of colonic polyps: Secondary | ICD-10-CM | POA: Diagnosis not present

## 2019-06-18 DIAGNOSIS — M5136 Other intervertebral disc degeneration, lumbar region: Secondary | ICD-10-CM | POA: Diagnosis not present

## 2019-06-18 DIAGNOSIS — M8949 Other hypertrophic osteoarthropathy, multiple sites: Secondary | ICD-10-CM

## 2019-06-18 DIAGNOSIS — I10 Essential (primary) hypertension: Secondary | ICD-10-CM | POA: Diagnosis not present

## 2019-06-18 DIAGNOSIS — M159 Polyosteoarthritis, unspecified: Secondary | ICD-10-CM

## 2019-06-18 DIAGNOSIS — E1165 Type 2 diabetes mellitus with hyperglycemia: Secondary | ICD-10-CM | POA: Diagnosis not present

## 2019-06-18 DIAGNOSIS — F419 Anxiety disorder, unspecified: Secondary | ICD-10-CM | POA: Diagnosis not present

## 2019-06-18 LAB — BAYER DCA HB A1C WAIVED: HB A1C (BAYER DCA - WAIVED): 8.3 % — ABNORMAL HIGH (ref <7.0)

## 2019-06-18 MED ORDER — ALPRAZOLAM 0.25 MG PO TABS: 0.2500 mg | ORAL_TABLET | Freq: Two times a day (BID) | ORAL | 5 refills | Status: DC | PRN

## 2019-06-18 MED ORDER — HYDROCODONE-ACETAMINOPHEN 7.5-325 MG PO TABS: 1.0000 | ORAL_TABLET | Freq: Three times a day (TID) | ORAL | 0 refills | Status: DC | PRN
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
## 2019-06-19 LAB — CMP14+EGFR
ALT: 10 IU/L (ref 0–32)
AST: 17 IU/L (ref 0–40)
Albumin/Globulin Ratio: 1.6 (ref 1.2–2.2)
Albumin: 3.9 g/dL (ref 3.8–4.8)
Alkaline Phosphatase: 68 IU/L (ref 39–117)
BUN/Creatinine Ratio: 17 (ref 12–28)
BUN: 34 mg/dL — ABNORMAL HIGH (ref 8–27)
Bilirubin Total: 0.2 mg/dL (ref 0.0–1.2)
CO2: 21 mmol/L (ref 20–29)
Calcium: 10.1 mg/dL (ref 8.7–10.3)
Chloride: 104 mmol/L (ref 96–106)
Creatinine, Ser: 1.95 mg/dL — ABNORMAL HIGH (ref 0.57–1.00)
GFR calc Af Amer: 29 mL/min/{1.73_m2} — ABNORMAL LOW (ref >59)
GFR calc non Af Amer: 26 mL/min/{1.73_m2} — ABNORMAL LOW (ref >59)
Globulin, Total: 2.5 g/dL (ref 1.5–4.5)
Glucose: 219 mg/dL — ABNORMAL HIGH (ref 65–99)
Potassium: 4.6 mmol/L (ref 3.5–5.2)
Sodium: 138 mmol/L (ref 134–144)
Total Protein: 6.4 g/dL (ref 6.0–8.5)

## 2019-06-19 LAB — LIPID PANEL
Chol/HDL Ratio: 2.9 ratio (ref 0.0–4.4)
Cholesterol, Total: 129 mg/dL (ref 100–199)
HDL: 45 mg/dL (ref >39)
LDL Chol Calc (NIH): 42 mg/dL (ref 0–99)
Triglycerides: 272 mg/dL — ABNORMAL HIGH (ref 0–149)
VLDL Cholesterol Cal: 42 mg/dL — ABNORMAL HIGH (ref 5–40)
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
## 2019-06-20 ENCOUNTER — Ambulatory Visit (INDEPENDENT_AMBULATORY_CARE_PROVIDER_SITE_OTHER): Payer: Medicare HMO | Admitting: Neurology

## 2019-06-20 ENCOUNTER — Encounter: Payer: Self-pay | Admitting: Neurology

## 2019-06-20 ENCOUNTER — Telehealth: Payer: Self-pay | Admitting: Physician Assistant

## 2019-06-20 ENCOUNTER — Other Ambulatory Visit: Payer: Self-pay

## 2019-06-20 VITALS — BP 92/64 | HR 76 | Temp 97.8°F | Ht 63.0 in | Wt 230.0 lb

## 2019-06-20 DIAGNOSIS — R413 Other amnesia: Secondary | ICD-10-CM

## 2019-06-20 DIAGNOSIS — R410 Disorientation, unspecified: Secondary | ICD-10-CM | POA: Diagnosis not present

## 2019-06-20 DIAGNOSIS — G3184 Mild cognitive impairment, so stated: Secondary | ICD-10-CM

## 2019-06-20 DIAGNOSIS — E538 Deficiency of other specified B group vitamins: Secondary | ICD-10-CM | POA: Diagnosis not present

## 2019-06-20 DIAGNOSIS — R7309 Other abnormal glucose: Secondary | ICD-10-CM

## 2019-06-20 NOTE — Patient Instructions (Signed)
MRI brain Lab work Follow up in 6 months  Memory Compensation Strategies  1. Use "WARM" strategy.  W= write it down  A= associate it  R= repeat it  M= make a mental note  2.   You can keep a Social worker.  Use a 3-ring notebook with sections for the following: calendar, important names and phone numbers,  medications, doctors' names/phone numbers, lists/reminders, and a section to journal what you did  each day.   3.    Use a calendar to write appointments down.  4.    Write yourself a schedule for the day.  This can be placed on the calendar or in a separate section of the Memory Notebook.  Keeping a  regular schedule can help memory.  5.    Use medication organizer with sections for each day or morning/evening pills.  You may need help loading it  6.    Keep a basket, or pegboard by the door.  Place items that you need to take out with you in the basket or on the pegboard.  You may also want to  include a message board for reminders.  7.    Use sticky notes.  Place sticky notes with reminders in a place where the task is performed.  For example: " turn off the  stove" placed by the stove, "lock the door" placed on the door at eye level, " take your medications" on  the bathroom mirror or by the place where you normally take your medications.  8.    Use alarms/timers.  Use while cooking to remind yourself to check on food or as a reminder to take your medicine, or as a  reminder to make a call, or as a reminder to perform another task, etc.

## 2019-06-20 NOTE — Telephone Encounter (Signed)
[place] to [place]

## 2019-06-21 ENCOUNTER — Telehealth: Payer: Self-pay | Admitting: Neurology

## 2019-06-21 ENCOUNTER — Encounter: Payer: Self-pay | Admitting: Family

## 2019-06-21 DIAGNOSIS — Z86010 Personal history of colonic polyps: Secondary | ICD-10-CM | POA: Diagnosis not present

## 2019-06-21 DIAGNOSIS — D124 Benign neoplasm of descending colon: Secondary | ICD-10-CM | POA: Diagnosis not present

## 2019-06-21 DIAGNOSIS — K635 Polyp of colon: Secondary | ICD-10-CM | POA: Diagnosis not present

## 2019-06-21 DIAGNOSIS — D120 Benign neoplasm of cecum: Secondary | ICD-10-CM | POA: Diagnosis not present

## 2019-06-21 LAB — HEMOGLOBIN A1C
Est. average glucose Bld gHb Est-mCnc: 194 mg/dL
Hgb A1c MFr Bld: 8.4 % — ABNORMAL HIGH (ref 4.8–5.6)

## 2019-06-21 NOTE — Telephone Encounter (Signed)
Mcarthur Rossetti Josem Kaufmann: [id number] (exp. 06/21/19 to 07/21/19) order sent to GI. They will reach out to the patient to schedule.

## 2019-06-25 ENCOUNTER — Telehealth: Payer: Self-pay | Admitting: Physician Assistant

## 2019-06-25 LAB — VITAMIN B1: Thiamine: 130 nmol/L (ref 66.5–200.0)

## 2019-06-25 LAB — B12 AND FOLATE PANEL
Folate: 2.6 ng/mL — ABNORMAL LOW (ref >3.0)
Vitamin B-12: 453 pg/mL (ref 232–1245)

## 2019-06-25 NOTE — Telephone Encounter (Signed)
Pt called - aware of recent refills

## 2019-07-03 ENCOUNTER — Telehealth: Payer: Self-pay | Admitting: Physician Assistant

## 2019-07-03 ENCOUNTER — Other Ambulatory Visit: Payer: Self-pay | Admitting: Pharmacist

## 2019-07-03 NOTE — Telephone Encounter (Signed)
Pt aware of meds sent to pharmacy on Oct 2

## 2019-07-04 NOTE — Patient Outreach (Signed)
[facility]) Care Management [place]  07/04/2019  SAARAH DEWING 1951/04/28 [id number]  Reason for referral: medication management  Assisted patient with having refills filled at [facility] [place].  Discussed compliance and importance of organizing medications in pill box.  Pill box has been mailed to patient.  No other needs identified.  Case Closure.  Patient was given PharmD contact info if needs arise.   Regina Eck, PharmD, [place]  [phone]

## 2019-07-05 ENCOUNTER — Ambulatory Visit (INDEPENDENT_AMBULATORY_CARE_PROVIDER_SITE_OTHER): Payer: Medicare HMO | Admitting: Licensed Clinical Social Worker

## 2019-07-05 ENCOUNTER — Encounter: Payer: Self-pay | Admitting: Licensed Clinical Social Worker

## 2019-07-05 DIAGNOSIS — N182 Chronic kidney disease, stage 2 (mild): Secondary | ICD-10-CM

## 2019-07-05 DIAGNOSIS — M5136 Other intervertebral disc degeneration, lumbar region: Secondary | ICD-10-CM

## 2019-07-05 DIAGNOSIS — F411 Generalized anxiety disorder: Secondary | ICD-10-CM

## 2019-07-05 DIAGNOSIS — I7121 Aneurysm of the ascending aorta, without rupture: Secondary | ICD-10-CM

## 2019-07-05 DIAGNOSIS — E1165 Type 2 diabetes mellitus with hyperglycemia: Secondary | ICD-10-CM | POA: Diagnosis not present

## 2019-07-05 DIAGNOSIS — I712 Thoracic aortic aneurysm, without rupture: Secondary | ICD-10-CM

## 2019-07-05 DIAGNOSIS — F419 Anxiety disorder, unspecified: Secondary | ICD-10-CM

## 2019-07-05 NOTE — Patient Instructions (Addendum)
Licensed Clinical Education officer, museum Visit Information  Goals we discussed today:    Current Barriers:   Chronic Mental Health needs related to anxiety  Social Isolation  Suicidal Ideation/Homicidal Ideation: No  Clinical Social Work Goal(s):   Over the next 30 days, patient will work with SW  by telephone or in person to reduce or manage symptoms related to anxiety/stress    Interventions:  Patient interviewed and appropriate assessments performed: GAD 7 and PHQ 9  Talked with client about relaxation techniques of client (she likes to swim and likes to read)  Talked with client about social support network  Talked with client about pain issus faced  Patient Self Care Activities:   Self administers medications as prescribed  Attends all scheduled provider appointments  Patient Coping Strengths:   Family  Friends  Patient Self Care Deficits:   Lacks social connections  Initial goal documentation  Follow Up Plan: LCSW to call client in next 3 weeks to talk with client about anxiety/stress issues faced by client  Materials Provided: No  The patient verbalized understanding of instructions provided today and declined a print copy of patient instruction materials.   [name].Makalia Bare MSW, LCSW Licensed Clinical Social Worker [facility] Care Management [phone]

## 2019-07-05 NOTE — Chronic Care Management (AMB) (Signed)
Care Management Note   Kara Nunez is a 68 y.o. year old female who is a primary care patient of Terald Sleeper, PA-C. The CM team was consulted for assistance with chronic disease management and care coordination.   I reached out to Kara Nunez by phone today.    Review of patient status, including review of consultants reports, relevant laboratory and other test results, and collaboration with appropriate care team members and the patient's provider was performed as part of comprehensive patient evaluation and provision of chronic care management services.   Social Determinants of Health: risk of social isolation; risk of depression    Chronic Care Management from 07/05/2019 in [place]  PHQ-9 Total Score  5     GAD 7 : Generalized Anxiety Score 04/06/2019  Nervous, Anxious, on Edge 2  Control/stop worrying 3  Worry too much - different things 3  Trouble relaxing 0  Restless 0  Easily annoyed or irritable 0  Afraid - awful might happen 1  Total GAD 7 Score 9   Outpatient Medications   Meds Overview  Enable clinical decision support by reconciling outside information   Last Edited   albuterol (VENTOLIN HFA) 108 (90 Base) MCG/ACT inhaler   USE 2 PUFFS EVERY 6 HOURS AS NEEDED 2 weeks ago   ALPRAZolam (XANAX) 0.25 mg tablet  Take 0.25 mg by mouth. 2 weeks ago   alprazolam 0.5 mg tablet  1 week ago   aspirin EC 81 MG tablet  Take 81 mg by mouth daily. 2 weeks ago   carvedilol (COREG) 25 MG tablet   Take by mouth. 2 weeks ago   clobetasol cream (TEMOVATE) 7.03 %  Apply 1 application topically 2 (two) times daily. 2 weeks ago   cloNIDine (CATAPRES) 0.2 MG tablet   TAKE ONE (1) TABLET THREE (3) TIMES EACH DAY 2 weeks ago   donepezil (ARICEPT) 10 MG tablet   Take 1 tablet (10 mg total) by mouth at bedtime. 2 weeks ago   escitalopram (LEXAPRO) 10 MG tablet   Take 1 tablet (10 mg total) by mouth daily. 2 weeks ago   Exenatide ER (BYDUREON BCISE)  2 MG/0.85ML AUIJ   Inject 2 mg into the skin once a week. 2 weeks ago   fenofibrate micronized 134 mg capsule  1 week ago   fluorouracil (EFUDEX) 5 % cream  Apply 1 application topically 2 (two) times daily as needed (Use on face). 2 weeks ago   furosemide (LASIX) 20 MG tablet   Take 1 tablet (20 mg total) by mouth daily. 2 weeks ago   HYDROcodone-acetaminophen (NORCO) 7.5-325 MG tablet   Take 1 tablet by mouth every 8 (eight) hours as needed for severe pain. 2 weeks ago   HYDROcodone-acetaminophen (NORCO) 7.5-325 MG tablet   Take 1 tablet by mouth every 8 (eight) hours as needed for severe pain. 2 weeks ago   HYDROcodone-acetaminophen (NORCO) 7.5-325 MG tablet   Take 1 tablet by mouth every 8 (eight) hours as needed for severe pain. 2 weeks ago   IFEREX 150 150 MG capsule   TAKE ONE (1) CAPSULE EACH DAY 2 weeks ago   levothyroxine (SYNTHROID) 50 MCG tablet   TAKE ONE TABLET EACH MORNING BEFORE BREAKFAST 2 weeks ago   losartan potassium (COZAAR) 100 mg tablet   Take 100 mg by mouth daily. 2 weeks ago   memantine HCl (NAMENDA XR) 28 mg 24 hr capsule   Take 28 mg by mouth  daily. 2 weeks ago   metoprolol succinate (TOPROL-XL) 100 mg 24 hr tablet   Take 100 mg by mouth daily. 2 weeks ago   NIFEdipine (PROCARDIA XL/NIFEDICAL-XL) 90 MG 24 hr tablet   Take 1 tablet (90 mg total) by mouth daily. 2 weeks ago   nitroGLYCERIN (NITRODUR - DOSED IN MG/24 HR) 0.1 mg/hr patch   Place 1 patch (0.1 mg total) onto the skin daily. 2 weeks ago   OLANZapine (ZYPREXA) 5 MG tablet   Take 1 tablet (5 mg total) by mouth at bedtime. 2 weeks ago   pantoprazole (PROTONIX) 40 MG tablet   Take 1 tablet (40 mg total) by mouth 2 (two) times daily. 2 weeks ago   potassium chloride (KLOR-CON) 10 MEQ tablet   Take 1 tablet (10 mEq total) by mouth 2 (two) times daily. 2 weeks ago   rosuvastatin (CRESTOR) 20 MG tablet   Take 1 tablet (20 mg total) by mouth daily. 2 weeks ago   sitaGLIPtin (JANUVIA) 100 MG tablet    TAKE ONE (1) TABLET EACH DAY 2 weeks ago   spironolactone (ALDACTONE) 25 MG tablet   Take 1 tablet (25 mg total) by mouth daily. 2 weeks ago   triamcinolone acetonide (KENALOG) 0.1% cream  2 weeks ago   Vitamin D, Ergocalciferol, (DRISDOL) 1.25 MG (50000 UT) CAPS capsule  TAKE 1 CAPSULE TWICE A WEEK 2 weeks ago   zolpidem (AMBIEN) 10 MG tablet   Take 1 tablet (10 mg total      Current Barriers:  . Chronic Mental Health needs related to anxiety . Social Isolation . Suicidal Ideation/Homicidal Ideation: No  Clinical Social Work Goal(s):  Marland Kitchen Over the next 30 days, patient will work with SW  by telephone or in person to reduce or manage symptoms related to anxiety/stress .   Interventions: . Patient interviewed and appropriate assessments performed: GAD 7 and PHQ 9 . Talked with client about relaxation techniques of client (she likes to swim and likes to read) . Talked with client about social support network . Talked with client about pain issus faced  Patient Self Care Activities:  . Self administers medications as prescribed . Attends all scheduled provider appointments  Patient Coping Strengths:  . Family . Friends  Patient Self Care Deficits:  . Lacks social connections  Initial goal documentation  Follow Up Plan: LCSW to call client in next 3 weeks to talk with client about anxiety/stress issues faced by client  [name].Shahira Fiske MSW, LCSW Licensed Clinical Social Worker [facility] Care Management [phone]

## 2019-07-07 NOTE — Patient Outreach (Signed)
[facility]) Care Management  [place]   07/07/2019  LATISHIA SUITT 14-Oct-1950 [id number]  Reason for referral: Medication Assistance, Medication Management  Referral source: [facility] RN Current insurance: [employer]  PMHx includes but not limited to:  T2DM, Dementia, HTN, HLD, hypothyroidsim, GERD  Outreach:  Successful telephone call with Ms. Trenton Gammon.  HIPAA identifiers verified.  Patient states she is doing okay today.  Her main concern is cost of medications and diabetes management.  Diabetes Patients most recent A1c was 8.4% on 06/20/2019.  She is currently on Bydureon once weekly & Januvia.  Patient states she likes the regimen and is comfortable with it.  She does not wish to change it at this time.  Would consider GLP1 switch to Ozempic given better evidence with cardiac and A1c lowering outcomes.  If better controlled on Ozempic, could consider d/c of Januvia.  Counseled patient on diet and exercise.  She is currently checking blood sugars 3 times weekly.  She denies hypoglycemia.  She reports FBG <150.  Patient is currently taking and filling statin and ARB.  Medication Management Discovered patient has FULL Low income subsidy (LIS), therefore a 71-month supply of medication is $8.95/brand name and $3.40/generic.  Will discuss with PCP about switching to [facility] per patient request (local pharmacy).  Patient's current pharmacy, [facility], will not fill 84-month supplies of patient's medications.  Financially, it will greatly benefit patient to switch pharmacies saving her about $300 over the course of 3 months.  Patient in agreement and verbalizes understanding.  Discuss in depth and with PCP given patient's dementia.  Will revisit as patient may benefit from a local pill pack pharmacy that will agree to 29-month fills, however I'm unaware of any that have that policy at this time.  Objective: Lab Results  Component Value Date   CREATININE 1.95 (H)  06/18/2019   CREATININE 1.9 (A) 03/20/2019   CREATININE 1.77 (H) 10/11/2018    Lab Results  Component Value Date   HGBA1C 8.4 (H) 06/20/2019    Lipid Panel     Component Value Date/Time   CHOL 129 06/18/2019 0845   TRIG 272 (H) 06/18/2019 0845   HDL 45 06/18/2019 0845   CHOLHDL 2.9 06/18/2019 0845   LDLCALC 42 06/18/2019 0845    BP Readings from Last 3 Encounters:  06/20/19 92/64  06/18/19 91/61  05/22/19 (!) 143/82    Allergies  Allergen Reactions  . Codeine Nausea Only  . Amoxicillin Rash    Medications Reviewed Today    Reviewed by Lavera Guise, [facility] (Pharmacist) on 07/07/19 at [place] List Status: <None>  Medication Order Taking? Sig Documenting Provider Last Dose Status Informant  albuterol (VENTOLIN HFA) 108 (90 Base) MCG/ACT inhaler [id number] No USE 2 PUFFS EVERY 6 HOURS AS NEEDED Terald Sleeper, PA-C Taking Active   ALPRAZolam Duanne Moron) 0.25 MG tablet [id number]  Take 1 tablet (0.25 mg total) by mouth 2 (two) times daily as needed for anxiety. Terald Sleeper, PA-C  Active   aspirin EC 81 MG tablet [id number] No Take 81 mg by mouth daily. [provider] Taking Active Self           Med Note Margit Hanks   Tue Oct 05, 2016  1:15 PM)    carvedilol (COREG) 25 MG tablet [id number] No Take by mouth. [provider] Taking Active   clobetasol cream (TEMOVATE) 0.05 % [id number] No Apply 1 application topically 2 (two) times daily.  Terald Sleeper, PA-C Taking Active   cloNIDine (CATAPRES) 0.2 MG tablet [id number] No TAKE ONE (1) TABLET THREE (3) TIMES EACH DAY Terald Sleeper, PA-C Taking Active   donepezil (ARICEPT) 10 MG tablet [id number] No Take 1 tablet (10 mg total) by mouth at bedtime. Terald Sleeper, PA-C Taking Active   escitalopram (LEXAPRO) 10 MG tablet [id number] No Take 1 tablet (10 mg total) by mouth daily. Terald Sleeper, PA-C Taking Active   Exenatide ER (BYDUREON BCISE) 2 MG/0.85ML AUIJ [id number] No Inject 2 mg into the skin once a week.  Terald Sleeper, PA-C Taking Active   fluorouracil (EFUDEX) 5 % cream [id number] No Apply 1 application topically 2 (two) times daily as needed (Use on face). [provider] Taking Active Self  furosemide (LASIX) 20 MG tablet [id number] No Take 1 tablet (20 mg total) by mouth daily. Terald Sleeper, PA-C Taking Active         Discontinued 07/07/19 2239 (Completed Course)         Discontinued 07/07/19 2239 (Completed Course)   HYDROcodone-acetaminophen (NORCO) 7.5-325 MG tablet [id number]  Take 1 tablet by mouth every 8 (eight) hours as needed for severe pain. Theodoro Clock  Active   IFEREX 150 150 MG capsule [id number] No TAKE ONE (1) CAPSULE EACH DAY Terald Sleeper, PA-C Taking Active   levothyroxine (SYNTHROID) 50 MCG tablet [id number] No TAKE ONE TABLET EACH MORNING BEFORE BREAKFAST Terald Sleeper, PA-C Taking Active   losartan (COZAAR) 100 MG tablet [id number] No Take 1 tablet (100 mg total) by mouth daily. Terald Sleeper, PA-C Taking Active   memantine (NAMENDA XR) 28 MG CP24 24 hr capsule [id number] No TAKE ONE (1) CAPSULE EACH DAY Terald Sleeper, PA-C Taking Active   metoprolol succinate (TOPROL-XL) 100 MG 24 hr tablet [id number] No TAKE ONE (1) TABLET EACH DAY  Patient not taking: Reported on 06/18/2019   Terald Sleeper, PA-C Not Taking Active   NIFEdipine (PROCARDIA XL/NIFEDICAL-XL) 90 MG 24 hr tablet [id number] No Take 1 tablet (90 mg total) by mouth daily. Terald Sleeper, PA-C Taking Active   nitroGLYCERIN (NITRODUR - DOSED IN MG/24 HR) 0.1 mg/hr patch [id number] No Place 1 patch (0.1 mg total) onto the skin daily. Terald Sleeper, PA-C Taking Active   OLANZapine (ZYPREXA) 5 MG tablet [id number] No Take 1 tablet (5 mg total) by mouth at bedtime. Terald Sleeper, PA-C Taking Active   pantoprazole (PROTONIX) 40 MG tablet [id number] No Take 1 tablet (40 mg total) by mouth 2 (two) times daily. Terald Sleeper, PA-C Taking Active   potassium chloride (KLOR-CON) 10 MEQ tablet [id number] No Take 1  tablet (10 mEq total) by mouth 2 (two) times daily. Terald Sleeper, PA-C Taking Active   rosuvastatin (CRESTOR) 20 MG tablet [id number] No Take 1 tablet (20 mg total) by mouth daily. Terald Sleeper, PA-C Taking Active   sitaGLIPtin (JANUVIA) 100 MG tablet [id number] No TAKE ONE (1) TABLET EACH DAY Terald Sleeper, PA-C Taking Active   spironolactone (ALDACTONE) 25 MG tablet [id number] No Take 1 tablet (25 mg total) by mouth daily. Terald Sleeper, PA-C Taking Active   Vitamin D, Ergocalciferol, (DRISDOL) 1.25 MG (50000 UT) CAPS capsule [id number] No TAKE 1 CAPSULE TWICE A WEEK Jones, Angel S, PA-C Taking Active   zolpidem (AMBIEN) 10 MG tablet [id number] No Take 1 tablet (10 mg total) by mouth at bedtime as needed. Terald Sleeper, PA-C Taking Active  Med List Note Holley Dexter 02/03/18 1426): Metform<12/20/18Pt uses CPAP Machine          Medication Assistance Findings:   Extra Help:  Already receiving Full Extra Help Low Income Subsidy   Plan: . Will follow up as needed in the future . Case Closure   Regina Eck, PharmD, [place]  [phone]

## 2019-07-10 DIAGNOSIS — I129 Hypertensive chronic kidney disease with stage 1 through stage 4 chronic kidney disease, or unspecified chronic kidney disease: Secondary | ICD-10-CM | POA: Diagnosis not present

## 2019-07-10 DIAGNOSIS — N184 Chronic kidney disease, stage 4 (severe): Secondary | ICD-10-CM | POA: Diagnosis not present

## 2019-07-10 DIAGNOSIS — I10 Essential (primary) hypertension: Secondary | ICD-10-CM | POA: Diagnosis not present

## 2019-07-10 DIAGNOSIS — E1122 Type 2 diabetes mellitus with diabetic chronic kidney disease: Secondary | ICD-10-CM | POA: Diagnosis not present

## 2019-07-16 ENCOUNTER — Other Ambulatory Visit: Payer: Self-pay | Admitting: Physician Assistant

## 2019-07-16 DIAGNOSIS — E1165 Type 2 diabetes mellitus with hyperglycemia: Secondary | ICD-10-CM

## 2019-07-16 MED ORDER — BYDUREON BCISE 2 MG/0.85ML ~~LOC~~ AUIJ: 2.0000 mg | AUTO-INJECTOR | SUBCUTANEOUS | 3 refills | Status: DC

## 2019-07-16 NOTE — Telephone Encounter (Signed)
meds sent in patient aware.

## 2019-07-18 ENCOUNTER — Other Ambulatory Visit: Payer: Self-pay

## 2019-07-18 ENCOUNTER — Ambulatory Visit
Admission: RE | Admit: 2019-07-18 | Discharge: 2019-07-18 | Disposition: A | Discharge status: Home / Self Care | Payer: Medicare HMO | Source: Ambulatory Visit | Attending: Neurology | Admitting: Neurology

## 2019-07-18 DIAGNOSIS — R410 Disorientation, unspecified: Secondary | ICD-10-CM

## 2019-07-18 DIAGNOSIS — R413 Other amnesia: Secondary | ICD-10-CM | POA: Diagnosis not present

## 2019-07-18 DIAGNOSIS — G3184 Mild cognitive impairment, so stated: Secondary | ICD-10-CM

## 2019-07-23 ENCOUNTER — Ambulatory Visit (INDEPENDENT_AMBULATORY_CARE_PROVIDER_SITE_OTHER): Payer: Medicare HMO | Admitting: Family

## 2019-07-23 ENCOUNTER — Encounter: Payer: Self-pay | Admitting: Family

## 2019-07-23 DIAGNOSIS — H109 Unspecified conjunctivitis: Secondary | ICD-10-CM

## 2019-07-23 DIAGNOSIS — B9689 Other specified bacterial agents as the cause of diseases classified elsewhere: Secondary | ICD-10-CM

## 2019-07-23 MED ORDER — POLYMYXIN B-TRIMETHOPRIM 10000-0.1 UNIT/ML-% OP SOLN: 1.0000 [drp] | Freq: Four times a day (QID) | OPHTHALMIC | 0 refills | Status: DC

## 2019-07-23 NOTE — Progress Notes (Signed)
   Virtual Visit via telephone Note Due to COVID-19 pandemic this visit was conducted virtually. This visit type was conducted due to national recommendations for restrictions regarding the COVID-19 Pandemic (e.g. social distancing, sheltering in place) in an effort to limit this patient's exposure and mitigate transmission in our community. All issues noted in this document were discussed and addressed.  A physical exam was not performed with this format.  I connected with Kara Nunez on 07/23/19 at 11:26 AM by telephone and verified that I am speaking with the correct person using two identifiers. Kara Nunez is currently located at home and no one is currently with her during visit. The provider, Evelina Dun, FNP is located in their office at time of visit.  I discussed the limitations, risks, security and privacy concerns of performing an evaluation and management service by telephone and the availability of in person appointments. I also discussed with the patient that there may be a patient responsible charge related to this service. The patient expressed understanding and agreed to proceed.   History and Present Illness:  Conjunctivitis  The current episode started more than 1 week ago. The onset was gradual. The problem occurs frequently. The problem has been gradually worsening (started in left eye several days ago and has spread to her right). The problem is mild. The symptoms are relieved by rest. Associated symptoms include eye itching, eye discharge and eye redness. Pertinent negatives include no double vision, no photophobia, no congestion and no rhinorrhea. The eyelid exhibits no abnormality.      Review of Systems  HENT: Negative for congestion and rhinorrhea.   Eyes: Positive for discharge, redness and itching. Negative for double vision and photophobia.  All other systems reviewed and are negative.    Observations/Objective: No SOB or distress noted   Assessment  and Plan: 1. Bacterial conjunctivitis of both eyes Good hand hygiene discussed New pillowcase  Do not rub eyes Call if symptoms worsen or do not improve  - trimethoprim-polymyxin b (POLYTRIM) ophthalmic solution; Place 1 drop into both eyes every 6 (six) hours.  Dispense: 10 mL; Refill: 0     I discussed the assessment and treatment plan with the patient. The patient was provided an opportunity to ask questions and all were answered. The patient agreed with the plan and demonstrated an understanding of the instructions.   The patient was advised to call back or seek an in-person evaluation if the symptoms worsen or if the condition fails to improve as anticipated.  The above assessment and management plan was discussed with the patient. The patient verbalized understanding of and has agreed to the management plan. Patient is aware to call the clinic if symptoms persist or worsen. Patient is aware when to return to the clinic for a follow-up visit. Patient educated on when it is appropriate to go to the emergency department.   Time call ended: 11:34 AM   I provided 8 minutes of non-face-to-face time during this encounter.    Evelina Dun, FNP

## 2019-07-26 ENCOUNTER — Ambulatory Visit (INDEPENDENT_AMBULATORY_CARE_PROVIDER_SITE_OTHER): Payer: Medicare HMO | Admitting: Licensed Clinical Social Worker

## 2019-07-26 DIAGNOSIS — I7121 Aneurysm of the ascending aorta, without rupture: Secondary | ICD-10-CM

## 2019-07-26 DIAGNOSIS — F411 Generalized anxiety disorder: Secondary | ICD-10-CM

## 2019-07-26 DIAGNOSIS — M5136 Other intervertebral disc degeneration, lumbar region: Secondary | ICD-10-CM

## 2019-07-26 DIAGNOSIS — M51369 Other intervertebral disc degeneration, lumbar region without mention of lumbar back pain or lower extremity pain: Secondary | ICD-10-CM

## 2019-07-26 DIAGNOSIS — N182 Chronic kidney disease, stage 2 (mild): Secondary | ICD-10-CM

## 2019-07-26 DIAGNOSIS — E1165 Type 2 diabetes mellitus with hyperglycemia: Secondary | ICD-10-CM | POA: Diagnosis not present

## 2019-07-26 DIAGNOSIS — F419 Anxiety disorder, unspecified: Secondary | ICD-10-CM

## 2019-07-26 DIAGNOSIS — I712 Thoracic aortic aneurysm, without rupture: Secondary | ICD-10-CM

## 2019-07-26 NOTE — Chronic Care Management (AMB) (Signed)
Care Management Note   Kara Nunez is a 68 y.o. year old female who is a primary care patient of Terald Sleeper, PA-C. The CM team was consulted for assistance with chronic disease management and care coordination.   I reached out to Kara Nunez by phone today.   Review of patient status, including review of consultants reports, relevant laboratory and other test results, and collaboration with appropriate care team members and the patient's provider was performed as part of comprehensive patient evaluation and provision of chronic care management services.   Social determinants of health: risk of social isolation; risk of tobacco use; risk of financial strain    Chronic Care Management from 07/05/2019 in [place]  PHQ-9 Total Score  5     GAD 7 : Generalized Anxiety Score 04/06/2019  Nervous, Anxious, on Edge 2  Control/stop worrying 3  Worry too much - different things 3  Trouble relaxing 0  Restless 0  Easily annoyed or irritable 0  Afraid - awful might happen 1  Total GAD 7 Score 9   Medications   New medications from outside sources are available for reconciliation   albuterol (VENTOLIN HFA) 108 (90 Base) MCG/ACT inhaler    ALPRAZolam (XANAX) 0.25 MG tablet    aspirin EC 81 MG tablet    carvedilol (COREG) 25 MG tablet    clobetasol cream (TEMOVATE) 0.05 %    cloNIDine (CATAPRES) 0.2 MG tablet    donepezil (ARICEPT) 10 MG tablet    escitalopram (LEXAPRO) 10 MG tablet    Exenatide ER (BYDUREON BCISE) 2 MG/0.85ML AUIJ    fluorouracil (EFUDEX) 5 % cream    furosemide (LASIX) 20 MG tablet    HYDROcodone-acetaminophen (NORCO) 7.5-325 MG tablet    IFEREX 150 150 MG capsule    levothyroxine (SYNTHROID) 50 MCG tablet    losartan (COZAAR) 100 MG tablet    memantine (NAMENDA XR) 28 MG CP24 24 hr capsule    metoprolol succinate (TOPROL-XL) 100 MG 24 hr tablet    NIFEdipine (PROCARDIA XL/NIFEDICAL-XL) 90 MG 24 hr tablet    nitroGLYCERIN (NITRODUR  - DOSED IN MG/24 HR) 0.1 mg/hr patch    OLANZapine (ZYPREXA) 5 MG tablet    pantoprazole (PROTONIX) 40 MG tablet    potassium chloride (KLOR-CON) 10 MEQ tablet    rosuvastatin (CRESTOR) 20 MG tablet    sitaGLIPtin (JANUVIA) 100 MG tablet    spironolactone (ALDACTONE) 25 MG tablet    trimethoprim-polymyxin b (POLYTRIM) ophthalmic solution    Vitamin D, Ergocalciferol, (DRISDOL) 1.25 MG (50000 UT) CAPS capsule    zolpidem (AMBIEN) 10 MG tablet    Goals        . Client wants to talk with LCSW about managing stress/anxiety about health conditions (pt-stated)     Current Barriers:  . Chronic Mental Health needs related to anxiety  . Social isolation . Suicidal Ideation/Homicidal Ideation: No   Clinical Social Work Clinical Goal(s):  Marland Kitchen Over the next 30 days, patient will work with SW by telephone or in person to reduce or manage symptoms related to anxiety/stress  Interventions: . Patient interviewed and appropriate assessment performed:  GAD 7 and PHQ 9 . Talked with client about relaxation techniques of client (she likes to swim and likes to read) . Talked with client about social support network . Talked with client about pain issues faced . Talked with [name] about her upcoming medical appointments . Encouraged client to talk with RNCM to discuss client's nursing  needs.   Patient Self Care Activities:   Self administers medications as prescribed Attends medical appointments as scheduled  Patient Coping Strengths; Family  Friends  Patient Self Care Deficits: Lacks social connections   Plan: Attend scheduled medical appointments Client to talk with RNCM about nursing needs Client to use relation activities as able LCSW to call client in 3 weeks to discuss psychosocial needs of client  Initial goal documentation       Follow Up Plan: LCSW to call client in next 3 weeks to talk with client about the psychosocial needs of client at that time  Kara Nunez.Kara Nunez MSW,  LCSW Licensed Clinical Social Worker [facility] Care Management [phone]

## 2019-07-26 NOTE — Patient Instructions (Addendum)
Licensed Clinical Social Worker Visit Information  Goals we discussed today:   Goals                  . Client wants to talk with LCSW about managing stress/anxiety about health conditions (pt-stated)       Current Barriers:   Chronic Mental Health needs related to anxiety   Social isolation  Suicidal Ideation/Homicidal Ideation: No   Clinical Social Work Clinical Goal(s):   Over the next 30 days, patient will work with SW by telephone or in person to reduce or manage symptoms related to anxiety/stress  Interventions:  Patient interviewed and appropriate assessment performed:  GAD 7 and PHQ 9  Talked with client about relaxation techniques of client (she likes to swim and likes to read)  Talked with client about social support network  Talked with client about pain issues faced  Talked with Yolani about her upcoming medical appointments  Encouraged client to talk with RNCM to discuss client's nursing needs.   Patient Self Care Activities:   Self administers medications as prescribed Attends medical appointments as scheduled  Patient Coping Strengths; Family  Friends  Patient Self Care Deficits: Lacks social connections   Plan: Attend scheduled medical appointments Client to talk with RNCM about nursing needs Client to use relation activities as able LCSW to call client in 3 weeks to discuss psychosocial needs of client  Initial goal documentation       Follow Up Plan: LCSW to call client in next 3 weeks to talk with client about the psychosocial needs of client at that time  Materials Provided: No  The patient verbalized understanding of instructions provided today and declined a print copy of patient instruction materials.   [name].Westlyn Glaza MSW, LCSW Licensed Clinical Social Worker [facility] Care Management [phone]

## 2019-08-16 ENCOUNTER — Telehealth: Payer: Self-pay | Admitting: Neurology

## 2019-08-16 DIAGNOSIS — I129 Hypertensive chronic kidney disease with stage 1 through stage 4 chronic kidney disease, or unspecified chronic kidney disease: Secondary | ICD-10-CM | POA: Diagnosis not present

## 2019-08-16 DIAGNOSIS — E782 Mixed hyperlipidemia: Secondary | ICD-10-CM | POA: Diagnosis not present

## 2019-08-16 DIAGNOSIS — R413 Other amnesia: Secondary | ICD-10-CM | POA: Diagnosis not present

## 2019-08-16 DIAGNOSIS — E1122 Type 2 diabetes mellitus with diabetic chronic kidney disease: Secondary | ICD-10-CM | POA: Diagnosis not present

## 2019-08-16 NOTE — Telephone Encounter (Signed)
A Dr Ernesto Rutherford called asking to speak with Dr Jaynee Eagles or her RN.  Dr Ernesto Rutherford is asking if pt has dementia, if so she will not be able to give dialysis.  Please call her at [phone] this is her mobile #

## 2019-08-17 ENCOUNTER — Telehealth: Payer: Self-pay

## 2019-08-20 ENCOUNTER — Other Ambulatory Visit: Payer: Self-pay | Admitting: Neurology

## 2019-08-20 ENCOUNTER — Telehealth: Payer: Self-pay | Admitting: Physician Assistant

## 2019-08-20 NOTE — Telephone Encounter (Signed)
Patient reports she has an appointment scheduled with [name] on 09/18/2019.

## 2019-08-20 NOTE — Telephone Encounter (Signed)
Patient says she has had dementia since the age of 81 (16 years), very unlikely. Spoke with Dr. Ernesto Rutherford, patient does not fulfill the criteria for dementia, possibly MCI with a large component of depression and social stressors but she performs all her own ADLs or IADLs.

## 2019-08-22 ENCOUNTER — Ambulatory Visit (INDEPENDENT_AMBULATORY_CARE_PROVIDER_SITE_OTHER): Payer: Medicare HMO | Admitting: Licensed Clinical Social Worker

## 2019-08-22 DIAGNOSIS — N182 Chronic kidney disease, stage 2 (mild): Secondary | ICD-10-CM | POA: Diagnosis not present

## 2019-08-22 DIAGNOSIS — M5136 Other intervertebral disc degeneration, lumbar region: Secondary | ICD-10-CM

## 2019-08-22 DIAGNOSIS — I10 Essential (primary) hypertension: Secondary | ICD-10-CM

## 2019-08-22 DIAGNOSIS — F419 Anxiety disorder, unspecified: Secondary | ICD-10-CM

## 2019-08-22 DIAGNOSIS — E1165 Type 2 diabetes mellitus with hyperglycemia: Secondary | ICD-10-CM | POA: Diagnosis not present

## 2019-08-22 NOTE — Chronic Care Management (AMB) (Signed)
Care Management Note   Kara Nunez is a 68 y.o. year old female who is a primary care patient of Terald Sleeper, PA-C. The CM team was consulted for assistance with chronic disease management and care coordination.   I reached out to Kara Nunez by phone today  Review of patient status, including review of consultants reports, relevant laboratory and other test results, and collaboration with appropriate care team members and the patient's provider was performed as part of comprehensive patient evaluation and provision of chronic care management services.   Social determinants of health: risk of social isolation; risk of stress    Chronic Care Management from 07/05/2019 in [place]  PHQ-9 Total Score  5     GAD 7 : Generalized Anxiety Score 04/06/2019  Nervous, Anxious, on Edge 2  Control/stop worrying 3  Worry too much - different things 3  Trouble relaxing 0  Restless 0  Easily annoyed or irritable 0  Afraid - awful might happen 1  Total GAD 7 Score 9   Medications   New medications from outside sources are available for reconciliation   albuterol (VENTOLIN HFA) 108 (90 Base) MCG/ACT inhaler    ALPRAZolam (XANAX) 0.25 MG tablet    aspirin EC 81 MG tablet    carvedilol (COREG) 25 MG tablet    clobetasol cream (TEMOVATE) 0.05 %    cloNIDine (CATAPRES) 0.2 MG tablet    donepezil (ARICEPT) 10 MG tablet    escitalopram (LEXAPRO) 10 MG tablet    Exenatide ER (BYDUREON BCISE) 2 MG/0.85ML AUIJ    fluorouracil (EFUDEX) 5 % cream    furosemide (LASIX) 20 MG tablet    HYDROcodone-acetaminophen (NORCO) 7.5-325 MG tablet    IFEREX 150 150 MG capsule    levothyroxine (SYNTHROID) 50 MCG tablet    losartan (COZAAR) 100 MG tablet    memantine (NAMENDA XR) 28 MG CP24 24 hr capsule    metoprolol succinate (TOPROL-XL) 100 MG 24 hr tablet    NIFEdipine (PROCARDIA XL/NIFEDICAL-XL) 90 MG 24 hr tablet    nitroGLYCERIN (NITRODUR - DOSED IN MG/24 HR) 0.1 mg/hr  patch    OLANZapine (ZYPREXA) 5 MG tablet    pantoprazole (PROTONIX) 40 MG tablet    potassium chloride (KLOR-CON) 10 MEQ tablet    rosuvastatin (CRESTOR) 20 MG tablet    sitaGLIPtin (JANUVIA) 100 MG tablet    spironolactone (ALDACTONE) 25 MG tablet    trimethoprim-polymyxin b (POLYTRIM) ophthalmic solution    Vitamin D, Ergocalciferol, (DRISDOL) 1.25 MG (50000 UT) CAPS capsule    GOAL:      . Client wants to talk with LCSW about managing stress/anxiety about health conditions (pt-stated)     Current Barriers:  . Anxiety/Stress issues of client with chronic diagnoses of CKD, Anxiety, Mixed Hyperlipidemia, HTN, Asthma, Type 2 DM and DDD . Pain issues (lower back, buttocks back og thighs)  Clinical Social Work Clinical Goal(s):  . Client and LCSW to talk in next 30 days about client management of anxiety and stress.  Interventions: . Previously talked with client about relaxation techniques  (swimming,reading,talking with neighbor)  Previously talked with client about social support network  Talked previously with client about pain issues faced  Previously encouraged client to talk with RNCM to discuss client's nursing needs.   Patient Self Care Activities:   Takes medications as prescribed Attends medical appointments  Plan: Attend scheduled medical appointments Client to talk with [facility] about nursing needs Client to use relation activities as  able LCSW to call client in next 4 weeks to discuss psychosocial needs of client  Initial goal documentation      Follow Up Plan: LCSW to call client in next 4 weeks to talk with client about the psychosocial needs of client at that time  Kara Nunez MSW, LCSW Licensed Clinical Social Worker [facility] Care Management [phone]

## 2019-08-22 NOTE — Patient Instructions (Addendum)
Licensed Clinical Social Worker Visit Information  Goals we discussed today:  Goals Addressed            This Visit's Progress   . Client wants to talk with LCSW about managing stress/anxiety about health conditions (pt-stated)       Current Barriers:  . Anxiety/Stress issues of client with chronic diagnoses of CKD, Anxiety, Mixed Hyperlipidemia, HTN, Asthma, Type 2 DM and DDD . Pain issues (lower back, buttocks back og thighs)  Clinical Social Work Clinical Goal(s):  . Client and LCSW to talk in next 30 days about client management of anxiety and stress.  Interventions:  Previously talked with client about relaxation techniques  (swimming,reading,talking with neighbor)  Previously talked with client about social support network  Talked previously with clientabout pain issues faced  Previously encouraged client to talk with RNCM to discuss client's nursing needs.  Patient Self Care Activities:   Takes medications as prescribed Attends medical appointments  Plan: Attend scheduled medical appointments Client to talk with [facility] about nursing needs Client to use relation activities as able LCSW to call client in 4 weeks to discuss psychosocial needs of client  Initial goal documentation         Materials Provided: No  Follow Up Plan: LCSW to call client in next 4 weeks to discuss the psychosocial needs of client at that time  The patient verbalized understanding of instructions provided today and declined a print copy of patient instruction materials.   [name].Medford Staheli MSW, LCSW Licensed Clinical Social Worker [facility] Care Management [phone]

## 2019-09-17 ENCOUNTER — Other Ambulatory Visit: Payer: Self-pay

## 2019-09-18 ENCOUNTER — Ambulatory Visit (INDEPENDENT_AMBULATORY_CARE_PROVIDER_SITE_OTHER): Payer: Medicare HMO

## 2019-09-18 ENCOUNTER — Encounter: Payer: Self-pay | Admitting: Physician Assistant

## 2019-09-18 ENCOUNTER — Ambulatory Visit (INDEPENDENT_AMBULATORY_CARE_PROVIDER_SITE_OTHER): Payer: Medicare HMO | Admitting: Physician Assistant

## 2019-09-18 VITALS — BP 101/63 | HR 62 | Temp 97.3°F | Ht 63.0 in | Wt 223.0 lb

## 2019-09-18 DIAGNOSIS — M545 Low back pain, unspecified: Secondary | ICD-10-CM

## 2019-09-18 DIAGNOSIS — N182 Chronic kidney disease, stage 2 (mild): Secondary | ICD-10-CM

## 2019-09-18 DIAGNOSIS — K529 Noninfective gastroenteritis and colitis, unspecified: Secondary | ICD-10-CM | POA: Diagnosis not present

## 2019-09-18 DIAGNOSIS — M47816 Spondylosis without myelopathy or radiculopathy, lumbar region: Secondary | ICD-10-CM | POA: Diagnosis not present

## 2019-09-18 DIAGNOSIS — G8929 Other chronic pain: Secondary | ICD-10-CM

## 2019-09-18 DIAGNOSIS — E039 Hypothyroidism, unspecified: Secondary | ICD-10-CM

## 2019-09-18 DIAGNOSIS — E1165 Type 2 diabetes mellitus with hyperglycemia: Secondary | ICD-10-CM

## 2019-09-18 DIAGNOSIS — M546 Pain in thoracic spine: Secondary | ICD-10-CM

## 2019-09-18 DIAGNOSIS — I2510 Atherosclerotic heart disease of native coronary artery without angina pectoris: Secondary | ICD-10-CM | POA: Diagnosis not present

## 2019-09-18 DIAGNOSIS — K6289 Other specified diseases of anus and rectum: Secondary | ICD-10-CM | POA: Diagnosis not present

## 2019-09-18 DIAGNOSIS — M5136 Other intervertebral disc degeneration, lumbar region: Secondary | ICD-10-CM | POA: Diagnosis not present

## 2019-09-18 LAB — BAYER DCA HB A1C WAIVED: HB A1C (BAYER DCA - WAIVED): 6.8 % (ref <7.0)

## 2019-09-18 MED ORDER — HYDROCODONE-ACETAMINOPHEN 7.5-325 MG PO TABS: 1.0000 | ORAL_TABLET | Freq: Three times a day (TID) | ORAL | 0 refills | Status: DC | PRN

## 2019-09-19 LAB — CBC WITH DIFFERENTIAL/PLATELET
Basophils Absolute: 0.1 10*3/uL (ref 0.0–0.2)
Basos: 1 %
EOS (ABSOLUTE): 0.3 10*3/uL (ref 0.0–0.4)
Eos: 4 %
Hematocrit: 42.3 % (ref 34.0–46.6)
Hemoglobin: 13.6 g/dL (ref 11.1–15.9)
Immature Grans (Abs): 0 10*3/uL (ref 0.0–0.1)
Immature Granulocytes: 0 %
Lymphocytes Absolute: 1.7 10*3/uL (ref 0.7–3.1)
Lymphs: 26 %
MCH: 28.3 pg (ref 26.6–33.0)
MCHC: 32.2 g/dL (ref 31.5–35.7)
MCV: 88 fL (ref 79–97)
Monocytes Absolute: 0.5 10*3/uL (ref 0.1–0.9)
Monocytes: 8 %
Neutrophils Absolute: 3.9 10*3/uL (ref 1.4–7.0)
Neutrophils: 61 %
Platelets: 227 10*3/uL (ref 150–450)
RBC: 4.8 x10E6/uL (ref 3.77–5.28)
RDW: 13.7 % (ref 11.7–15.4)
WBC: 6.4 10*3/uL (ref 3.4–10.8)

## 2019-09-19 LAB — LIPID PANEL
Chol/HDL Ratio: 2.4 ratio (ref 0.0–4.4)
Cholesterol, Total: 98 mg/dL — ABNORMAL LOW (ref 100–199)
HDL: 41 mg/dL (ref >39)
LDL Chol Calc (NIH): 20 mg/dL (ref 0–99)
Triglycerides: 248 mg/dL — ABNORMAL HIGH (ref 0–149)
VLDL Cholesterol Cal: 37 mg/dL (ref 5–40)

## 2019-09-19 LAB — CMP14+EGFR
ALT: 29 IU/L (ref 0–32)
AST: 36 IU/L (ref 0–40)
Albumin/Globulin Ratio: 1.4 (ref 1.2–2.2)
Albumin: 3.8 g/dL (ref 3.8–4.8)
Alkaline Phosphatase: 91 IU/L (ref 39–117)
BUN/Creatinine Ratio: 12 (ref 12–28)
BUN: 21 mg/dL (ref 8–27)
Bilirubin Total: 0.3 mg/dL (ref 0.0–1.2)
CO2: 23 mmol/L (ref 20–29)
Calcium: 9.6 mg/dL (ref 8.7–10.3)
Chloride: 104 mmol/L (ref 96–106)
Creatinine, Ser: 1.69 mg/dL — ABNORMAL HIGH (ref 0.57–1.00)
GFR calc Af Amer: 35 mL/min/{1.73_m2} — ABNORMAL LOW (ref >59)
GFR calc non Af Amer: 30 mL/min/{1.73_m2} — ABNORMAL LOW (ref >59)
Globulin, Total: 2.7 g/dL (ref 1.5–4.5)
Glucose: 174 mg/dL — ABNORMAL HIGH (ref 65–99)
Potassium: 4.3 mmol/L (ref 3.5–5.2)
Sodium: 142 mmol/L (ref 134–144)
Total Protein: 6.5 g/dL (ref 6.0–8.5)

## 2019-09-19 LAB — TSH: TSH: 4.35 u[IU]/mL (ref 0.450–4.500)

## 2019-09-21 ENCOUNTER — Telehealth: Payer: Self-pay | Admitting: Physician Assistant

## 2019-09-21 ENCOUNTER — Ambulatory Visit (INDEPENDENT_AMBULATORY_CARE_PROVIDER_SITE_OTHER): Payer: Medicare HMO | Admitting: Licensed Clinical Social Worker

## 2019-09-21 DIAGNOSIS — E1165 Type 2 diabetes mellitus with hyperglycemia: Secondary | ICD-10-CM

## 2019-09-21 DIAGNOSIS — M5136 Other intervertebral disc degeneration, lumbar region: Secondary | ICD-10-CM

## 2019-09-21 DIAGNOSIS — I10 Essential (primary) hypertension: Secondary | ICD-10-CM

## 2019-09-21 DIAGNOSIS — E782 Mixed hyperlipidemia: Secondary | ICD-10-CM

## 2019-09-21 DIAGNOSIS — F411 Generalized anxiety disorder: Secondary | ICD-10-CM

## 2019-09-21 DIAGNOSIS — F419 Anxiety disorder, unspecified: Secondary | ICD-10-CM

## 2019-09-21 DIAGNOSIS — N182 Chronic kidney disease, stage 2 (mild): Secondary | ICD-10-CM | POA: Diagnosis not present

## 2019-09-21 NOTE — Patient Instructions (Addendum)
Licensed Clinical Social Worker Visit Information  Goals we discussed today:  Goals Addressed            This Visit's Progress   . Client wants to talk with LCSW about managing stress/anxiety about health conditions (pt-stated)       Current Barriers:  . Anxiety/Stress issues of client with chronic diagnoses of CKD, Anxiety, Mixed Hyperlipidemia, HTN, Asthma, Type 2 DM and DDD . Pain issues (lower back, buttocks back og thighs)  Clinical Social Work Clinical Goal(s):  . Client and LCSW to talk in next 30 days about client management of anxiety and stress.  Interventions:  Encouraged client to talk with RNCM about nursing needs  Talked with Tanicia about medications  Talked about relaxation techniques  (swimming,reading,talking with neighbor)  Talked with client about social support network  Talkedwith clientabout pain issues faced  Talked with client about transport needs of client  Talked with Machelle about social interaction of client   Talked with Payge about weight loss of client (she said she has lost about  7 pounds in last 6 months)  Patient Self Care Activities:   Takes medications as prescribed Attends medical appointments  Plan: Attend scheduled medical appointments Client to talk with RNCM about nursing needs Client to use relation activities as able LCSW to call client in 4 weeks to discuss psychosocial needs of client  Initial goal documentation         Materials Provided: No  Follow Up Plan: LCSW to call client in next 4 weeks to discuss psychosocial needs of client at that time   The patient verbalized understanding of instructions provided today and declined a print copy of patient instruction materials.   Kara Nunez.Kara Nunez MSW, LCSW Licensed Clinical Social Worker [facility] Care Management [phone]

## 2019-09-21 NOTE — Telephone Encounter (Signed)
Our pain agreement states that we do not refill pain medications, even if they are lost or stolen.  She will have to wait until next month for her refill.

## 2019-09-21 NOTE — Telephone Encounter (Signed)
FYI

## 2019-09-21 NOTE — Chronic Care Management (AMB) (Signed)
Care Management Note   Kara Nunez is a 69 y.o. year old female who is a primary care patient of Kara Sleeper, PA-C. The CM team was consulted for assistance with chronic disease management and care coordination.   I reached out to Kara Nunez by phone today.    Review of patient status, including review of consultants reports, relevant laboratory and other test results, and collaboration with appropriate care team members and the patient's provider was performed as part of comprehensive patient evaluation and provision of chronic care management services.   Social determinants of health:risk of social isolation; risk of depression    Chronic Care Management from 07/05/2019 in [place]  PHQ-9 Total Score  5     GAD 7 : Generalized Anxiety Score 04/06/2019  Nervous, Anxious, on Edge 2  Control/stop worrying 3  Worry too much - different things 3  Trouble relaxing 0  Restless 0  Easily annoyed or irritable 0  Afraid - awful might happen 1  Total GAD 7 Score 9   Medications   (very important)  New medications from outside sources are available for reconciliation  albuterol (VENTOLIN HFA) 108 (90 Base) MCG/ACT inhaler ALPRAZolam (XANAX) 0.25 MG tablet aspirin EC 81 MG tablet carvedilol (COREG) 25 MG tablet clobetasol cream (TEMOVATE) 0.05 % cloNIDine (CATAPRES) 0.2 MG tablet donepezil (ARICEPT) 10 MG tablet escitalopram (LEXAPRO) 10 MG tablet Exenatide ER (BYDUREON BCISE) 2 MG/0.85ML AUIJ fluorouracil (EFUDEX) 5 % cream furosemide (LASIX) 20 MG tablet HYDROcodone-acetaminophen (NORCO) 7.5-325 MG tablet HYDROcodone-acetaminophen (NORCO) 7.5-325 MG tablet IFEREX 150 150 MG capsule levothyroxine (SYNTHROID) 50 MCG tablet losartan (COZAAR) 100 MG tablet memantine (NAMENDA XR) 28 MG CP24 24 hr capsule metoprolol succinate (TOPROL-XL) 100 MG 24 hr tablet NIFEdipine (PROCARDIA XL/NIFEDICAL-XL) 90 MG 24 hr tablet nitroGLYCERIN (NITRODUR - DOSED  IN MG/24 HR) 0.1 mg/hr patch OLANZapine (ZYPREXA) 5 MG tablet pantoprazole (PROTONIX) 40 MG tablet potassium chloride (KLOR-CON) 10 MEQ tablet rosuvastatin (CRESTOR) 20 MG tablet sitaGLIPtin (JANUVIA) 100 MG tablet spironolactone (ALDACTONE) 25 MG tablet triamcinolone cream (KENALOG) 0.1 % trimethoprim-polymyxin b (POLYTRIM) ophthalmic solution Vitamin D, Ergocalciferol, (DRISDOL) 1.25 MG (50000 UT) CAPS capsule zolpidem (AMBIEN) 10 MG tablet  Goals Addressed            This Visit's Progress   . Client wants to talk with LCSW about managing stress/anxiety about health conditions (pt-stated)       Current Barriers:  . Anxiety/Stress issues of client with chronic diagnoses of CKD, Anxiety, Mixed Hyperlipidemia, HTN, Type 2 DM and DDD . Pain issues (lower back, buttocks back og thighs)  Clinical Social Work Clinical Goal(s):  . Client and LCSW to talk in next 30 days about client management of anxiety and stress.  Interventions: . Encouraged client to talk with Kara Nunez about nursing needs . Talked with Kara Nunez about medications . Talked about relaxation techniques  (swimming,reading,talking with neighbor)  Talked with client about social support network  Talked with clientabout pain issues faced  Talked with client about transport needs of client  Talked with Kara Nunez about social interaction of client   Talked with Kara Nunez about weight loss of client (she said she has lost about  7 pounds in last 6 months)  Patient Self Care Activities:   Takes medications as prescribed Attends medical appointments  Plan: Attend scheduled medical appointments Client to talk with Kara Nunez about nursing needs Client to use relation activities as able LCSW to call client in 4 weeks to discuss psychosocial needs  of client  Initial goal documentation       Follow Up Plan: LCSW to call client in next 4 weeks to discuss the psychosocial needs of client at that time  [name].[name] MSW,  LCSW Licensed Clinical Social Worker Kara Nunez/[facility] Care Management [phone]

## 2019-09-21 NOTE — Telephone Encounter (Signed)
Patient aware and verbalizes understanding. 

## 2019-09-23 NOTE — Progress Notes (Signed)
Acute Office Visit  Subjective:    Patient ID: Kara Nunez, female    DOB: 1951-01-17, 69 y.o.   MRN: [id number]  Chief Complaint  Patient presents with  . Hypertension  . Diabetes  . Medical Management of Chronic Issues    Hypertension This is a chronic problem. The current episode started more than 1 year ago. The problem is controlled. Pertinent negatives include no chest pain. There are no associated agents to hypertension. Risk factors for coronary artery disease include diabetes mellitus, dyslipidemia and obesity. Past treatments include calcium channel blockers, ACE inhibitors, beta blockers and diuretics. The current treatment provides significant improvement. Identifiable causes of hypertension include a thyroid problem.  Diabetes She presents for her follow-up diabetic visit. She has type 2 diabetes mellitus. Her disease course has been improving. There are no diabetic associated symptoms. Pertinent negatives for diabetes include no chest pain and no fatigue. There are no hypoglycemic complications. Symptoms are stable. There are no diabetic complications. An ACE inhibitor/angiotensin II receptor blocker is being taken. She sees a podiatrist.Eye exam is current.  Thyroid Problem Presents for follow-up visit. Symptoms include cold intolerance and diarrhea. Patient reports no fatigue, hair loss or nail problem. The symptoms have been resolved.  Back Pain This is a chronic problem. The problem occurs daily. The problem is unchanged. The pain is present in the lumbar spine. The symptoms are aggravated by bending and position. Stiffness is present in the morning. Pertinent negatives include no abdominal pain, chest pain, dysuria or fever.  Diarrhea  This is a chronic problem. The current episode started more than 1 month ago. The problem occurs 2 to 4 times per day. The problem has been gradually worsening. The stool consistency is described as mucous and watery. The patient states  that diarrhea does not awaken her from sleep. Pertinent negatives include no abdominal pain, coughing or fever. The treatment provided no relief.   At this time we are tapering off of the alprazolam completely.  She is given instructions on stopping it and also we will keep her Norco at the same dose.  She is currently at 43 MME which is a very good number we will plan to recheck her back in 1 or 2 months.  PDMP is reviewed and there are no red flags.  Past Medical History:  Diagnosis Date  . Allergic rhinitis   . Ascending aortic aneurysm ([place])    Followed by Dr. [name]  . Chronic back pain   . Coronary atherosclerosis of native coronary artery    Prior evaluation with in [facility] - details not clear   . Degenerative disc disease   . Essential hypertension, benign   . Hyperlipidemia   . Insomnia   . Kidney disease    stage 4  . Type 2 diabetes mellitus ([place])     Past Surgical History:  Procedure Laterality Date  . CHOLECYSTECTOMY    . TUBAL LIGATION      Family History  Problem Relation Age of Onset  . Asthma Mother   . Allergies Mother   . Heart disease Mother   . Allergies Maternal Grandmother   . Heart disease Maternal Grandmother   . Breast cancer Maternal Grandmother   . Heart disease Maternal Grandfather   . Heart disease Paternal Grandfather   . Heart disease Paternal Grandmother   . Dementia Neg Hx   . Alzheimer's disease Neg Hx     Social History   Socioeconomic History  . Marital  status: Married    Spouse name: Not on file  . Number of children: 2  . Years of education: 14-15   . Highest education level: Some college, no degree  Occupational History  . Occupation: Retired    Comment: Adult nurse  Tobacco Use  . Smoking status: Never Smoker  . Smokeless tobacco: Never Used  Substance and Sexual Activity  . Alcohol use: Yes    Comment: Rare. mixed drink once or twice a year  . Drug use: No  . Sexual activity: Not Currently    Other Topics Concern  . Not on file  Social History Narrative   Lives by herself   Right handed   Social Determinants of Health   Financial Resource Strain: Low Risk   . Difficulty of Paying Living Expenses: Not hard at all  Food Insecurity: No Food Insecurity  . Worried About Charity fundraiser in the Last Year: Never true  . Ran Out of Food in the Last Year: Never true  Transportation Needs: No Transportation Needs  . Lack of Transportation (Medical): No  . Lack of Transportation (Non-Medical): No  Physical Activity:   . Days of Exercise per Week: Not on file  . Minutes of Exercise per Session: Not on file  Stress:   . Feeling of Stress : Not on file  Social Connections: Severely Isolated  . Frequency of Communication with Friends and Family: Once a week  . Frequency of Social Gatherings with Friends and Family: Never  . Attends Religious Services: Never  . Active Member of Clubs or Organizations: No  . Attends Archivist Meetings: Never  . Marital Status: Divorced  Human resources officer Violence: Not At Risk  . Fear of Current or Ex-Partner: No  . Emotionally Abused: No  . Physically Abused: No  . Sexually Abused: No    Outpatient Medications Prior to Visit  Medication Sig Dispense Refill  . albuterol (VENTOLIN HFA) 108 (90 Base) MCG/ACT inhaler USE 2 PUFFS EVERY 6 HOURS AS NEEDED 54 g 3  . ALPRAZolam (XANAX) 0.25 MG tablet Take 1 tablet (0.25 mg total) by mouth 2 (two) times daily as needed for anxiety. 60 tablet 5  . aspirin EC 81 MG tablet Take 81 mg by mouth daily.    . carvedilol (COREG) 25 MG tablet Take by mouth.    . clobetasol cream (TEMOVATE) 8.93 % Apply 1 application topically 2 (two) times daily. 60 g 2  . cloNIDine (CATAPRES) 0.2 MG tablet TAKE ONE (1) TABLET THREE (3) TIMES EACH DAY 270 tablet 1  . donepezil (ARICEPT) 10 MG tablet Take 1 tablet (10 mg total) by mouth at bedtime. 90 tablet 1  . escitalopram (LEXAPRO) 10 MG tablet Take 1 tablet (10  mg total) by mouth daily. 90 tablet 1  . Exenatide ER (BYDUREON BCISE) 2 MG/0.85ML AUIJ Inject 2 mg into the skin once a week. 13 pen 3  . fluorouracil (EFUDEX) 5 % cream Apply 1 application topically 2 (two) times daily as needed (Use on face).    . furosemide (LASIX) 20 MG tablet Take 1 tablet (20 mg total) by mouth daily. 90 tablet 1  . IFEREX 150 150 MG capsule TAKE ONE (1) CAPSULE EACH DAY 90 capsule 3  . levothyroxine (SYNTHROID) 50 MCG tablet TAKE ONE TABLET EACH MORNING BEFORE BREAKFAST 90 tablet 3  . losartan (COZAAR) 100 MG tablet Take 1 tablet (100 mg total) by mouth daily. 90 tablet 3  . memantine (NAMENDA  XR) 28 MG CP24 24 hr capsule TAKE ONE (1) CAPSULE EACH DAY 90 capsule 1  . metoprolol succinate (TOPROL-XL) 100 MG 24 hr tablet TAKE ONE (1) TABLET EACH DAY 90 tablet 1  . NIFEdipine (PROCARDIA XL/NIFEDICAL-XL) 90 MG 24 hr tablet Take 1 tablet (90 mg total) by mouth daily. 90 tablet 3  . nitroGLYCERIN (NITRODUR - DOSED IN MG/24 HR) 0.1 mg/hr patch Place 1 patch (0.1 mg total) onto the skin daily. 90 patch 3  . OLANZapine (ZYPREXA) 5 MG tablet Take 1 tablet (5 mg total) by mouth at bedtime. 90 tablet 3  . pantoprazole (PROTONIX) 40 MG tablet Take 1 tablet (40 mg total) by mouth 2 (two) times daily. 180 tablet 1  . potassium chloride (KLOR-CON) 10 MEQ tablet Take 1 tablet (10 mEq total) by mouth 2 (two) times daily. 180 tablet 1  . rosuvastatin (CRESTOR) 20 MG tablet Take 1 tablet (20 mg total) by mouth daily. 90 tablet 3  . sitaGLIPtin (JANUVIA) 100 MG tablet TAKE ONE (1) TABLET EACH DAY 90 tablet 3  . spironolactone (ALDACTONE) 25 MG tablet Take 1 tablet (25 mg total) by mouth daily. 90 tablet 1  . triamcinolone cream (KENALOG) 0.1 %     . trimethoprim-polymyxin b (POLYTRIM) ophthalmic solution Place 1 drop into both eyes every 6 (six) hours. 10 mL 0  . Vitamin D, Ergocalciferol, (DRISDOL) 1.25 MG (50000 UT) CAPS capsule TAKE 1 CAPSULE TWICE A WEEK 24 capsule 3  . zolpidem (AMBIEN)  10 MG tablet Take 1 tablet (10 mg total) by mouth at bedtime as needed. 90 tablet 1  . HYDROcodone-acetaminophen (NORCO) 7.5-325 MG tablet Take 1 tablet by mouth every 8 (eight) hours as needed for severe pain. 90 tablet 0   No facility-administered medications prior to visit.    Allergies  Allergen Reactions  . Codeine Nausea Only  . Amoxicillin Rash    Review of Systems  Constitutional: Negative.  Negative for activity change, fatigue and fever.  HENT: Negative.   Eyes: Negative.   Respiratory: Negative.  Negative for cough.   Cardiovascular: Negative.  Negative for chest pain.  Gastrointestinal: Positive for diarrhea. Negative for abdominal pain.  Endocrine: Positive for cold intolerance.  Genitourinary: Negative.  Negative for dysuria.  Musculoskeletal: Positive for back pain.  Skin: Negative.   Neurological: Negative.        Objective:    Physical Exam Constitutional:      General: She is not in acute distress.    Appearance: Normal appearance. She is well-developed.  HENT:     Head: Normocephalic and atraumatic.  Cardiovascular:     Rate and Rhythm: Normal rate.  Pulmonary:     Effort: Pulmonary effort is normal.  Skin:    General: Skin is warm and dry.     Findings: No rash.  Neurological:     Mental Status: She is alert and oriented to person, place, and time.     Deep Tendon Reflexes: Reflexes are normal and symmetric.     BP 101/63   Pulse 62   Temp (!) 97.3 F (36.3 C) (Temporal)   Ht _0  (1.6 m)   Wt 223 lb (101.2 kg)   SpO2 95%   BMI 39.50 kg/m  Wt Readings from Last 3 Encounters:  09/18/19 223 lb (101.2 kg)  06/20/19 230 lb (104.3 kg)  06/18/19 228 lb 9.6 oz (103.7 kg)    Health Maintenance Due  Topic Date Due  . Hepatitis C Screening  1951/09/01  .  OPHTHALMOLOGY EXAM  01/24/1959  . MAMMOGRAM  01/24/1999  . DEXA SCAN  01/23/2014  . PNA vac Low Risk Adult (2 of 2 - PPSV23) 06/04/2018    There are no preventive care reminders to  display for this patient.   Lab Results  Component Value Date   TSH 4.350 09/18/2019   Lab Results  Component Value Date   WBC 6.4 09/18/2019   HGB 13.6 09/18/2019   HCT 42.3 09/18/2019   MCV 88 09/18/2019   PLT 227 09/18/2019   Lab Results  Component Value Date   NA 142 09/18/2019   K 4.3 09/18/2019   CO2 23 09/18/2019   GLUCOSE 174 (H) 09/18/2019   BUN 21 09/18/2019   CREATININE 1.69 (H) 09/18/2019   BILITOT 0.3 09/18/2019   ALKPHOS 91 09/18/2019   AST 36 09/18/2019   ALT 29 09/18/2019   PROT 6.5 09/18/2019   ALBUMIN 3.8 09/18/2019   CALCIUM 9.6 09/18/2019   ANIONGAP 8 12/28/2016   GFR 94.79 01/26/2011   Lab Results  Component Value Date   CHOL 98 (L) 09/18/2019   Lab Results  Component Value Date   HDL 41 09/18/2019   Lab Results  Component Value Date   LDLCALC 20 09/18/2019   Lab Results  Component Value Date   TRIG 248 (H) 09/18/2019   Lab Results  Component Value Date   CHOLHDL 2.4 09/18/2019   Lab Results  Component Value Date   HGBA1C 6.8 09/18/2019       Assessment & Plan:   Problem List Items Addressed This Visit      Cardiovascular and Mediastinum   Coronary atherosclerosis of native coronary artery   Relevant Orders   CBC with Differential/Platelet   CMP14+EGFR   Lipid Panel     Digestive   Chronic diarrhea   Relevant Orders   Ambulatory referral to Gastroenterology     Endocrine   Type 2 diabetes mellitus, uncontrolled ([place])   Relevant Orders   hgba1c   Hypothyroidism   Relevant Orders   TSH     Genitourinary   CKD (chronic kidney disease) - Primary   Relevant Orders   CBC with Differential/Platelet   CMP14+EGFR     Other   Rectal pain   Relevant Orders   Ambulatory referral to Gastroenterology    Other Visit Diagnoses    Chronic right-sided thoracic back pain       Relevant Medications   HYDROcodone-acetaminophen (NORCO) 7.5-325 MG tablet   HYDROcodone-acetaminophen (NORCO) 7.5-325 MG tablet   Other  Relevant Orders   DG Thoracic Spine 2 View (Completed)   Ambulatory referral to Pain Clinic   Lumbar pain       Relevant Medications   HYDROcodone-acetaminophen (NORCO) 7.5-325 MG tablet   HYDROcodone-acetaminophen (NORCO) 7.5-325 MG tablet   Other Relevant Orders   DG Lumbar Spine -back pain (Completed)   Ambulatory referral to Pain Clinic       Meds ordered this encounter  Medications  . HYDROcodone-acetaminophen (NORCO) 7.5-325 MG tablet    Sig: Take 1 tablet by mouth every 8 (eight) hours as needed for severe pain.    Dispense:  90 tablet    Refill:  0    Order Specific Question:   Supervising Provider    Answer:   Janora Norlander [[id number]]  . HYDROcodone-acetaminophen (NORCO) 7.5-325 MG tablet    Sig: Take 1 tablet by mouth every 8 (eight) hours as needed for moderate pain.    Dispense:  90  tablet    Refill:  0    .Fill 30 days from original script date    Order Specific Question:   Supervising Provider    Answer:   Janora Norlander [[id number]]     Terald Sleeper, PA-C

## 2019-09-25 NOTE — Progress Notes (Signed)
Ac 01/12 lmtcb

## 2019-09-27 DIAGNOSIS — F0390 Unspecified dementia without behavioral disturbance: Secondary | ICD-10-CM | POA: Diagnosis not present

## 2019-09-27 DIAGNOSIS — I10 Essential (primary) hypertension: Secondary | ICD-10-CM | POA: Diagnosis not present

## 2019-09-27 DIAGNOSIS — I129 Hypertensive chronic kidney disease with stage 1 through stage 4 chronic kidney disease, or unspecified chronic kidney disease: Secondary | ICD-10-CM | POA: Diagnosis not present

## 2019-09-27 DIAGNOSIS — Z6841 Body Mass Index (BMI) 40.0 and over, adult: Secondary | ICD-10-CM | POA: Diagnosis not present

## 2019-09-27 DIAGNOSIS — E782 Mixed hyperlipidemia: Secondary | ICD-10-CM | POA: Diagnosis not present

## 2019-09-27 DIAGNOSIS — E1122 Type 2 diabetes mellitus with diabetic chronic kidney disease: Secondary | ICD-10-CM | POA: Diagnosis not present

## 2019-09-27 DIAGNOSIS — N184 Chronic kidney disease, stage 4 (severe): Secondary | ICD-10-CM | POA: Diagnosis not present

## 2019-10-05 ENCOUNTER — Encounter: Payer: Self-pay | Admitting: *Deleted

## 2019-10-08 ENCOUNTER — Other Ambulatory Visit: Payer: Self-pay | Admitting: Physician Assistant

## 2019-10-08 DIAGNOSIS — I10 Essential (primary) hypertension: Secondary | ICD-10-CM

## 2019-10-08 DIAGNOSIS — R413 Other amnesia: Secondary | ICD-10-CM

## 2019-10-08 MED ORDER — POTASSIUM CHLORIDE ER 10 MEQ PO TBCR: 10.0000 meq | EXTENDED_RELEASE_TABLET | Freq: Two times a day (BID) | ORAL | 1 refills | Status: DC

## 2019-10-08 MED ORDER — MEMANTINE HCL ER 28 MG PO CP24: ORAL_CAPSULE | ORAL | 1 refills | Status: DC

## 2019-10-08 NOTE — Telephone Encounter (Signed)
What is the name of the medication?  Amlodipine 5 mg memantine (NAMENDA XR) 28 MG CP24 24 hr capsule potassium chloride (KLOR-CON) 10 MEQ tablet  Have you contacted your pharmacy to request a refill? yes  Which pharmacy would you like this sent to? [facility] in [place] fax [phone]   Patient notified that their request is being sent to the clinical staff for review and that they should receive a call once it is complete. If they do not receive a call within 24 hours they can check with their pharmacy or our office.

## 2019-10-10 ENCOUNTER — Telehealth: Payer: Self-pay | Admitting: Cardiology

## 2019-10-10 ENCOUNTER — Ambulatory Visit (INDEPENDENT_AMBULATORY_CARE_PROVIDER_SITE_OTHER): Payer: Medicare HMO | Admitting: *Deleted

## 2019-10-10 ENCOUNTER — Other Ambulatory Visit: Payer: Self-pay

## 2019-10-10 DIAGNOSIS — Z0000 Encounter for general adult medical examination without abnormal findings: Secondary | ICD-10-CM | POA: Diagnosis not present

## 2019-10-10 NOTE — Progress Notes (Signed)
MEDICARE ANNUAL WELLNESS VISIT  10/10/2019  Telephone Visit Disclaimer This Medicare AWV was conducted by telephone due to national recommendations for restrictions regarding the COVID-19 Pandemic (e.g. social distancing).  I verified, using two identifiers, that I am speaking with Kara Nunez or their authorized healthcare agent. I discussed the limitations, risks, security, and privacy concerns of performing an evaluation and management service by telephone and the potential availability of an in-person appointment in the future. The patient expressed understanding and agreed to proceed.   Subjective:  Kara Nunez is a 69 y.o. female patient of Terald Sleeper, PA-C who had a Medicare Annual Wellness Visit today via telephone. Kara Nunez is Retired and lives alone. she has 2 living children and 1 deceased. she reports that she is socially active and does interact with friends/family regularly. she is minimally physically active and enjoys reading and swimming.  Patient Care Team: Theodoro Clock as PCP - General (Physician Assistant) Satira Sark, MD as PCP - Cardiology (Cardiology) Reynold Bowen, NT as Referring Physician Tanda Rockers, MD as Attending Physician (Pulmonary Disease) Ilean China, RN as Case Manager Katha Cabal, LCSW as Social Worker (Licensed Clinical Social Worker)  Advanced Directives 10/10/2019 04/12/2019 04/12/2019 11/01/2017 09/20/2016 02/21/2012  Does Patient Have a Medical Advance Directive? No - No No No Patient does not have advance directive  Would patient like information on creating a medical advance directive? No - Patient declined (No Data) Yes (MAU/Ambulatory/Procedural Areas - Information given) No - Patient declined No - Patient declined -  Pre-existing out of facility DNR order (yellow form or pink MOST form) - - - - - No    Hospital Utilization Over the Past 12 Months: # of hospitalizations or ER visits: 0 # of surgeries:  0  Review of Systems    Patient reports that her overall health is worse compared to last year.  History obtained from chart review  Patient Reported Readings (BP, Pulse, CBG, Weight, etc) none  Pain Assessment Pain : 0-10 Pain Score: 5  Pain Type: Chronic pain Pain Location: Back Pain Descriptors / Indicators: Burning     Current Medications & Allergies (verified) Allergies as of 10/10/2019      Reactions   Losartan Other (See Comments)   Her renal function will not tolerate ACEi/ARB. Please contact nephrology prior to re-challenge.    Codeine Nausea Only   Amoxicillin Rash      Medication List       Accurate as of October 10, 2019  1:54 PM. If you have any questions, ask your nurse or doctor.        STOP taking these medications   losartan 100 MG tablet Commonly known as: COZAAR   trimethoprim-polymyxin b ophthalmic solution Commonly known as: Polytrim     TAKE these medications   albuterol 108 (90 Base) MCG/ACT inhaler Commonly known as: Ventolin HFA USE 2 PUFFS EVERY 6 HOURS AS NEEDED   ALPRAZolam 0.25 MG tablet Commonly known as: XANAX Take 1 tablet (0.25 mg total) by mouth 2 (two) times daily as needed for anxiety.   aspirin EC 81 MG tablet Take 81 mg by mouth daily.   Bydureon BCise 2 MG/0.85ML Auij Generic drug: Exenatide ER Inject 2 mg into the skin once a week.   carvedilol 25 MG tablet Commonly known as: COREG Take by mouth.   clobetasol cream 0.05 % Commonly known as: TEMOVATE Apply 1 application topically 2 (two) times daily.  cloNIDine 0.2 MG tablet Commonly known as: CATAPRES TAKE ONE (1) TABLET THREE (3) TIMES EACH DAY   donepezil 10 MG tablet Commonly known as: ARICEPT Take 1 tablet (10 mg total) by mouth at bedtime.   escitalopram 10 MG tablet Commonly known as: Lexapro Take 1 tablet (10 mg total) by mouth daily.   fluorouracil 5 % cream Commonly known as: EFUDEX Apply 1 application topically 2 (two) times daily as  needed (Use on face).   furosemide 20 MG tablet Commonly known as: LASIX Take 1 tablet (20 mg total) by mouth daily.   HYDROcodone-acetaminophen 7.5-325 MG tablet Commonly known as: Norco Take 1 tablet by mouth every 8 (eight) hours as needed for severe pain.   HYDROcodone-acetaminophen 7.5-325 MG tablet Commonly known as: Norco Take 1 tablet by mouth every 8 (eight) hours as needed for moderate pain.   IFerex 150 150 MG capsule Generic drug: iron polysaccharides TAKE ONE (1) CAPSULE EACH DAY   levothyroxine 50 MCG tablet Commonly known as: SYNTHROID TAKE ONE TABLET EACH MORNING BEFORE BREAKFAST   memantine 28 MG Cp24 24 hr capsule Commonly known as: NAMENDA XR TAKE ONE (1) CAPSULE EACH DAY   metoprolol succinate 100 MG 24 hr tablet Commonly known as: TOPROL-XL TAKE ONE (1) TABLET EACH DAY   NIFEdipine 90 MG 24 hr tablet Commonly known as: PROCARDIA XL/NIFEDICAL-XL Take 1 tablet (90 mg total) by mouth daily.   nitroGLYCERIN 0.1 mg/hr patch Commonly known as: NITRODUR - Dosed in mg/24 hr Place 1 patch (0.1 mg total) onto the skin daily.   OLANZapine 5 MG tablet Commonly known as: ZYPREXA Take 1 tablet (5 mg total) by mouth at bedtime.   pantoprazole 40 MG tablet Commonly known as: PROTONIX Take 1 tablet (40 mg total) by mouth 2 (two) times daily.   potassium chloride 10 MEQ tablet Commonly known as: KLOR-CON Take 1 tablet (10 mEq total) by mouth 2 (two) times daily.   rosuvastatin 20 MG tablet Commonly known as: CRESTOR Take 1 tablet (20 mg total) by mouth daily.   sitaGLIPtin 100 MG tablet Commonly known as: Januvia TAKE ONE (1) TABLET EACH DAY   spironolactone 25 MG tablet Commonly known as: ALDACTONE Take 1 tablet (25 mg total) by mouth daily.   triamcinolone cream 0.1 % Commonly known as: KENALOG   Vitamin D (Ergocalciferol) 1.25 MG (50000 UNIT) Caps capsule Commonly known as: DRISDOL TAKE 1 CAPSULE TWICE A WEEK   zolpidem 10 MG tablet Commonly  known as: AMBIEN Take 1 tablet (10 mg total) by mouth at bedtime as needed.       History (reviewed): Past Medical History:  Diagnosis Date  . Allergic rhinitis   . Ascending aortic aneurysm ([place])    Followed by Dr. [name]  . Chronic back pain   . Coronary atherosclerosis of native coronary artery    Prior evaluation with in [facility] - details not clear   . Degenerative disc disease   . Essential hypertension, benign   . Hyperlipidemia   . Insomnia   . Kidney disease    stage 4  . Type 2 diabetes mellitus ([place])    Past Surgical History:  Procedure Laterality Date  . CHOLECYSTECTOMY    . TUBAL LIGATION     Family History  Problem Relation Age of Onset  . Asthma Mother   . Allergies Mother   . Heart disease Mother   . Allergies Maternal Grandmother   . Heart disease Maternal Grandmother   . Breast cancer  Maternal Grandmother   . Heart disease Maternal Grandfather   . Heart disease Paternal Grandfather   . Heart disease Paternal Grandmother   . Dementia Neg Hx   . Alzheimer's disease Neg Hx    Social History   Socioeconomic History  . Marital status: Married    Spouse name: Not on file  . Number of children: 2  . Years of education: 14-15   . Highest education level: Some college, no degree  Occupational History  . Occupation: Retired    Comment: Adult nurse  Tobacco Use  . Smoking status: Never Smoker  . Smokeless tobacco: Never Used  Substance and Sexual Activity  . Alcohol use: Not Currently    Comment: Rare. mixed drink once or twice a year  . Drug use: No  . Sexual activity: Not Currently    Birth control/protection: Surgical  Other Topics Concern  . Not on file  Social History Narrative   Lives by herself   Right handed   Social Determinants of Health   Financial Resource Strain: Low Risk   . Difficulty of Paying Living Expenses: Not very hard  Food Insecurity: No Food Insecurity  . Worried About Charity fundraiser in  the Last Year: Never true  . Ran Out of Food in the Last Year: Never true  Transportation Needs: No Transportation Needs  . Lack of Transportation (Medical): No  . Lack of Transportation (Non-Medical): No  Physical Activity: Inactive  . Days of Exercise per Week: 0 days  . Minutes of Exercise per Session: 0 min  Stress: No Stress Concern Present  . Feeling of Stress : Not at all  Social Connections: Moderately Isolated  . Frequency of Communication with Friends and Family: More than three times a week  . Frequency of Social Gatherings with Friends and Family: More than three times a week  . Attends Religious Services: Never  . Active Member of Clubs or Organizations: No  . Attends Archivist Meetings: Never  . Marital Status: Divorced    Activities of Daily Living In your present state of health, do you have any difficulty performing the following activities: 10/10/2019 04/12/2019  Hearing? Y N  Comment her hearing isn't as good as it used to be -  Vision? N N  Comment gets yearly eye exam -  Difficulty concentrating or making decisions? N N  Walking or climbing stairs? N Y  Dressing or bathing? N N  Doing errands, shopping? N N  Preparing Food and eating ? N -  Using the Toilet? N -  In the past six months, have you accidently leaked urine? Y -  Comment maybe once a week -  Do you have problems with loss of bowel control? N -  Managing your Medications? N -  Managing your Finances? N -  Housekeeping or managing your Housekeeping? Y -  Comment has to take frequent rest periods -  Some recent data might be hidden    Patient Education/ Literacy How often do you need to have someone help you when you read instructions, pamphlets, or other written materials from your doctor or pharmacy?: 1 - Never What is the last grade level you completed in school?: 2 years of college-no degree  Exercise Current Exercise Habits: The patient does not participate in regular exercise  at present, Exercise limited by: orthopedic condition(s);cardiac condition(s);respiratory conditions(s)  Diet Patient reports consuming 2 meals a day and 1 snack(s) a day Patient reports that her  primary diet is: Regular Patient reports that she does have regular access to food.   Depression Screen PHQ 2/9 Scores 10/10/2019 07/05/2019 07/05/2019 06/18/2019 04/06/2019 03/19/2019 10/11/2018  PHQ - 2 Score 2 2 2  0 4 3 0  PHQ- 9 Score 6 5 5  - 8 8 -     Fall Risk Fall Risk  10/10/2019 06/18/2019 03/19/2019 10/11/2018 07/04/2018  Falls in the past year? 0 0 0 1 Yes  Number falls in past yr: - - - 0 1  Injury with Fall? - - - 0 No     Objective:  Kara Nunez seemed alert and oriented and she participated appropriately during our telephone visit.  Blood Pressure Weight BMI  BP Readings from Last 3 Encounters:  09/18/19 101/63  06/20/19 92/64  06/18/19 91/61   Wt Readings from Last 3 Encounters:  09/18/19 223 lb (101.2 kg)  06/20/19 230 lb (104.3 kg)  06/18/19 228 lb 9.6 oz (103.7 kg)   BMI Readings from Last 1 Encounters:  09/18/19 39.50 kg/m    *Unable to obtain current vital signs, weight, and BMI due to telephone visit type  Hearing/Vision  . Kara Nunez did not seem to have difficulty with hearing/understanding during the telephone conversation . Reports that she has had a formal eye exam by an eye care professional within the past year . Reports that she has not had a formal hearing evaluation within the past year *Unable to fully assess hearing and vision during telephone visit type  Cognitive Function: 6CIT Screen 10/10/2019  What Year? 0 points  What month? 0 points  What time? 0 points  Count back from 20 0 points  Months in reverse 0 points  Repeat phrase 2 points  Total Score 2   (Normal:0-7, Significant for Dysfunction: >8)  Normal Cognitive Function Screening: Yes   Immunization & Health Maintenance Record Immunization History  Administered Date(s) Administered  .  Influenza, High Dose Seasonal PF 05/23/2019  . Influenza,inj,Quad PF,6+ Mos 06/03/2017  . Influenza-Unspecified 06/01/2018  . PPD Test 10/25/2018  . Pneumococcal Conjugate-13 06/05/2015  . Pneumococcal Polysaccharide-23 06/15/2007, 06/04/2013    Health Maintenance  Topic Date Due  . Hepatitis C Screening  05-Jul-1951  . OPHTHALMOLOGY EXAM  01/24/1959  . MAMMOGRAM  01/24/1999  . DEXA SCAN  01/23/2014  . PNA vac Low Risk Adult (2 of 2 - PPSV23) 06/04/2018  . TETANUS/TDAP  09/17/2020 (Originally 01/24/1968)  . HEMOGLOBIN A1C  03/17/2020  . FOOT EXAM  09/17/2020  . COLONOSCOPY  06/20/2029  . INFLUENZA VACCINE  Completed       Assessment  This is a routine wellness examination for [employer].  Health Maintenance: Due or Overdue Health Maintenance Due  Topic Date Due  . Hepatitis C Screening  1951-03-10  . OPHTHALMOLOGY EXAM  01/24/1959  . MAMMOGRAM  01/24/1999  . DEXA SCAN  01/23/2014  . PNA vac Low Risk Adult (2 of 2 - PPSV23) 06/04/2018    Kara Nunez does not need a referral for Community Assistance: Care Management:   no Social Work:    no Prescription Assistance:  no Nutrition/Diabetes Education:  no   Plan:  Personalized Goals Goals Addressed            This Visit's Progress   . DIET - INCREASE WATER INTAKE       Try to drink 6-8 glasses of water daily      Personalized Health Maintenance & Screening Recommendations  Td vaccine Shingles vaccine  Lung  Cancer Screening Recommended: no (Low Dose CT Chest recommended if Age 66-80 years, 30 pack-year currently smoking OR have quit w/in past 15 years) Hepatitis C Screening recommended: no HIV Screening recommended: no  Advanced Directives: Written information was not prepared per patient's request.  Referrals & Orders No orders of the defined types were placed in this encounter.   Follow-up Plan . Follow-up with Terald Sleeper, PA-C as planned . Consider your Shingles and TDAP vaccines at your  next visit with your PCP   I have personally reviewed and noted the following in the patient's chart:   . Medical and social history . Use of alcohol, tobacco or illicit drugs  . Current medications and supplements . Functional ability and status . Nutritional status . Physical activity . Advanced directives . List of other physicians . Hospitalizations, surgeries, and ER visits in previous 12 months . Vitals . Screenings to include cognitive, depression, and falls . Referrals and appointments  In addition, I have reviewed and discussed with Kara Nunez certain preventive protocols, quality metrics, and best practice recommendations. A written personalized care plan for preventive services as well as general preventive health recommendations is available and can be mailed to the patient at her request.      Milas Hock, LPN  [id number]

## 2019-10-10 NOTE — Patient Instructions (Signed)
Preventive Care 38 Years and Older, Female Preventive care refers to lifestyle choices and visits with your health care provider that can promote health and wellness. This includes:  A yearly physical exam. This is also called an annual well check.  Regular dental and eye exams.  Immunizations.  Screening for certain conditions.  Healthy lifestyle choices, such as diet and exercise. What can I expect for my preventive care visit? Physical exam Your health care provider will check:  Height and weight. These may be used to calculate body mass index (BMI), which is a measurement that tells if you are at a healthy weight.  Heart rate and blood pressure.  Your skin for abnormal spots. Counseling Your health care provider may ask you questions about:  Alcohol, tobacco, and drug use.  Emotional well-being.  Home and relationship well-being.  Sexual activity.  Eating habits.  History of falls.  Memory and ability to understand (cognition).  Work and work Statistician.  Pregnancy and menstrual history. What immunizations do I need?  Influenza (flu) vaccine  This is recommended every year. Tetanus, diphtheria, and pertussis (Tdap) vaccine  You may need a Td booster every 10 years. Varicella (chickenpox) vaccine  You may need this vaccine if you have not already been vaccinated. Zoster (shingles) vaccine  You may need this after age 71. Pneumococcal conjugate (PCV13) vaccine  One dose is recommended after age 71. Pneumococcal polysaccharide (PPSV23) vaccine  One dose is recommended after age 71. Measles, mumps, and rubella (MMR) vaccine  You may need at least one dose of MMR if you were born in 1957 or later. You may also need a second dose. Meningococcal conjugate (MenACWY) vaccine  You may need this if you have certain conditions. Hepatitis A vaccine  You may need this if you have certain conditions or if you travel or work in places where you may be exposed  to hepatitis A. Hepatitis B vaccine  You may need this if you have certain conditions or if you travel or work in places where you may be exposed to hepatitis B. Haemophilus influenzae type b (Hib) vaccine  You may need this if you have certain conditions. You may receive vaccines as individual doses or as more than one vaccine together in one shot (combination vaccines). Talk with your health care provider about the risks and benefits of combination vaccines. What tests do I need? Blood tests  Lipid and cholesterol levels. These may be checked every 5 years, or more frequently depending on your overall health.  Hepatitis C test.  Hepatitis B test. Screening  Lung cancer screening. You may have this screening every year starting at age 71 if you have a 30-pack-year history of smoking and currently smoke or have quit within the past 15 years.  Colorectal cancer screening. All adults should have this screening starting at age 71 and continuing until age 15. Your health care provider may recommend screening at age 23 if you are at increased risk. You will have tests every 1-10 years, depending on your results and the type of screening test.  Diabetes screening. This is done by checking your blood sugar (glucose) after you have not eaten for a while (fasting). You may have this done every 1-3 years.  Mammogram. This may be done every 1-2 years. Talk with your health care provider about how often you should have regular mammograms.  BRCA-related cancer screening. This may be done if you have a family history of breast, ovarian, tubal, or peritoneal cancers.  Other tests  Sexually transmitted disease (STD) testing.  Bone density scan. This is done to screen for osteoporosis. You may have this done starting at age 71. Follow these instructions at home: Eating and drinking  Eat a diet that includes fresh fruits and vegetables, whole grains, lean protein, and low-fat dairy products. Limit  your intake of foods with high amounts of sugar, saturated fats, and salt.  Take vitamin and mineral supplements as recommended by your health care provider.  Do not drink alcohol if your health care provider tells you not to drink.  If you drink alcohol: ? Limit how much you have to 0-1 drink a day. ? Be aware of how much alcohol is in your drink. In the [place] one drink equals one 12 oz bottle of beer (355 mL), one 5 oz glass of wine (148 mL), or one 1 oz glass of hard liquor (44 mL). Lifestyle  Take daily care of your teeth and gums.  Stay active. Exercise for at least 30 minutes on 5 or more days each week.  Do not use any products that contain nicotine or tobacco, such as cigarettes, e-cigarettes, and chewing tobacco. If you need help quitting, ask your health care provider.  If you are sexually active, practice safe sex. Use a condom or other form of protection in order to prevent STIs (sexually transmitted infections).  Talk with your health care provider about taking a low-dose aspirin or statin. What's next?  Go to your health care provider once a year for a well check visit.  Ask your health care provider how often you should have your eyes and teeth checked.  Stay up to date on all vaccines. This information is not intended to replace advice given to you by your health care provider. Make sure you discuss any questions you have with your health care provider. Document Revised: 08/24/2018 Document Reviewed: 08/24/2018 Elsevier Patient Education  2020 [employer].

## 2019-10-10 NOTE — Telephone Encounter (Signed)

## 2019-10-16 ENCOUNTER — Encounter: Payer: Self-pay | Admitting: Cardiology

## 2019-10-16 NOTE — Progress Notes (Deleted)
{Choose 1 Note Type (Telehealth Visit or Telephone Visit):[phone]}   Date:  10/16/2019   ID:  Kara Nunez, DOB [birth year], MRN [id number]  {Patient Location:[id number]::"Home"} {Provider Location:[phone]::"Home"}  PCP:  Terald Sleeper, PA-C  Cardiologist:  Rozann Lesches, MD Electrophysiologist:  None   Evaluation Performed:  {Choose Visit JEHU:[id number]::"CHYIFO-YD Visit"}  Chief Complaint:  ***  History of Present Illness:    Kara Nunez is a 69 y.o. female last seen in July 2019.  Chest CTA from September 2020 as outlined below.  She continues to follow with Dr. Roxan Nunez.  Ascending thoracic aortic aneurysm was stable at 4.5 cm.  I reviewed her recent lab work as outlined below.  The patient {does/does not:200015} have symptoms concerning for COVID-19 infection (fever, chills, cough, or new shortness of breath).    Past Medical History:  Diagnosis Date  . Allergic rhinitis   . Ascending aortic aneurysm ([place])    Followed by Dr. Roxan Nunez  . Chronic back pain   . CKD (chronic kidney disease) stage 3, GFR 30-59 ml/min   . Coronary atherosclerosis of native coronary artery    Prior evaluation with in [facility] - details not clear   . Degenerative disc disease   . Essential hypertension   . Hyperlipidemia   . Insomnia   . Type 2 diabetes mellitus ([place])    Past Surgical History:  Procedure Laterality Date  . CHOLECYSTECTOMY    . TUBAL LIGATION       No outpatient medications have been marked as taking for the 10/17/19 encounter (Appointment) with Satira Sark, MD.     Allergies:   Losartan, Codeine, and Amoxicillin   Social History   Tobacco Use  . Smoking status: Never Smoker  . Smokeless tobacco: Never Used  Substance Use Topics  . Alcohol use: Not Currently    Comment: Rare. mixed drink once or twice a year  . Drug use: No     Family Hx: The patient's family history includes Allergies in her maternal grandmother and mother;  Asthma in her mother; Breast cancer in her maternal grandmother; Heart disease in her maternal grandfather, maternal grandmother, mother, paternal grandfather, and paternal grandmother. There is no history of Dementia or Alzheimer's disease.  ROS:   Please see the history of present illness.    *** All other systems reviewed and are negative.   Prior CV studies:   The following studies were reviewed today:  Lexiscan Myoview 05/15/2017:  There was no ST segment deviation noted during stress.  Findings consistent with mild anterior ischemia and prior small inferolateral myocardial infarction with mild peri-infarct ischemia.  The left ventricular ejection fraction is normal (55-65%).  Low to intermediate risk study. Two mild areas of ischemia as described above.  Chest CTA 05/22/2019: FINDINGS: Cardiovascular: Grossly stable 4.5 cm ascending thoracic aortic aneurysm is noted. Atherosclerosis of thoracic aorta is noted. Normal cardiac size. Minimal pericardial effusion is noted. Mild coronary artery calcifications are noted.  Mediastinum/Nodes: No enlarged mediastinal or axillary lymph nodes. Thyroid gland, trachea, and esophagus demonstrate no significant findings.  Lungs/Pleura: Lungs are clear. No pleural effusion or pneumothorax.  Upper Abdomen: No acute abnormality.  Musculoskeletal: No chest wall mass or suspicious bone lesions identified.  IMPRESSION: Grossly stable 4.5 cm ascending thoracic aortic aneurysm. Recommend semi-annual imaging followup by CTA or MRA and referral to cardiothoracic surgery if not already obtained. This recommendation follows 2010 ACCF/AHA/AATS/ACR/ASA/SCA/SCAI/SIR/STS/SVM Guidelines for the Diagnosis and Management of Patients With Thoracic Aortic Disease. Circulation. 2010; 121:  [phone]. Aortic aneurysm NOS (ICD10-I71.9).  Mild coronary calcifications are noted.  Aortic Atherosclerosis (ICD10-I70.0).  Labs/Other Tests and Data  Reviewed:    EKG:  An ECG dated 03/28/2018 was personally reviewed today and demonstrated:  Sinus rhythm with left anterior fascicular block.  Recent Labs: 09/18/2019: ALT 29; BUN 21; Creatinine, Ser 1.69; Hemoglobin 13.6; Platelets 227; Potassium 4.3; Sodium 142; TSH 4.350   Recent Lipid Panel Lab Results  Component Value Date/Time   CHOL 98 (L) 09/18/2019 10:35 AM   TRIG 248 (H) 09/18/2019 10:35 AM   HDL 41 09/18/2019 10:35 AM   CHOLHDL 2.4 09/18/2019 10:35 AM   LDLCALC 20 09/18/2019 10:35 AM    Wt Readings from Last 3 Encounters:  09/18/19 223 lb (101.2 kg)  06/20/19 230 lb (104.3 kg)  06/18/19 228 lb 9.6 oz (103.7 kg)     Objective:    Vital Signs:  There were no vitals taken for this visit.   {[facility]Care Virtual Exam (Optional):[phone]::"VITAL SIGNS:  reviewed"}  ASSESSMENT & PLAN:    1. ***  COVID-19 Education: The signs and symptoms of COVID-19 were discussed with the patient and how to seek care for testing (follow up with PCP or arrange E-visit).  ***The importance of social distancing was discussed today.  Time:   Today, I have spent *** minutes with the patient with telehealth technology discussing the above problems.     Medication Adjustments/Labs and Tests Ordered: Current medicines are reviewed at length with the patient today.  Concerns regarding medicines are outlined above.   Tests Ordered: No orders of the defined types were placed in this encounter.   Medication Changes: No orders of the defined types were placed in this encounter.   Follow Up:  {F/U Format:[phone]} {follow up:15908}  Signed, Rozann Lesches, MD  10/16/2019 3:22 PM    [facility]

## 2019-10-17 ENCOUNTER — Telehealth: Payer: Medicare HMO | Admitting: Cardiology

## 2019-10-18 ENCOUNTER — Telehealth: Payer: Self-pay

## 2019-10-18 ENCOUNTER — Other Ambulatory Visit: Payer: Self-pay | Admitting: *Deleted

## 2019-10-18 DIAGNOSIS — I712 Thoracic aortic aneurysm, without rupture, unspecified: Secondary | ICD-10-CM

## 2019-10-18 NOTE — Telephone Encounter (Signed)
Called pt. No answer. Left message for pt to return call.  

## 2019-10-18 NOTE — Telephone Encounter (Signed)
Returning call to [place] Nurse Please try call again [phone]  Thanks renee

## 2019-10-18 NOTE — Telephone Encounter (Signed)
Spoke with pt. She missed a virtual visit yesterday with Dr. Domenic Polite. We rescheduled her appt for in office as she has not seen him in a while.

## 2019-10-18 NOTE — Telephone Encounter (Signed)
I called number again and it said "your call cannot go through as dialed, please try your call later"

## 2019-10-23 ENCOUNTER — Encounter: Payer: Self-pay | Admitting: Physician Assistant

## 2019-10-23 ENCOUNTER — Ambulatory Visit (INDEPENDENT_AMBULATORY_CARE_PROVIDER_SITE_OTHER): Payer: Medicare HMO | Admitting: Physician Assistant

## 2019-10-23 DIAGNOSIS — M545 Low back pain, unspecified: Secondary | ICD-10-CM

## 2019-10-23 DIAGNOSIS — M546 Pain in thoracic spine: Secondary | ICD-10-CM

## 2019-10-23 DIAGNOSIS — G8929 Other chronic pain: Secondary | ICD-10-CM | POA: Diagnosis not present

## 2019-10-23 MED ORDER — HYDROCODONE-ACETAMINOPHEN 7.5-325 MG PO TABS: 1.0000 | ORAL_TABLET | Freq: Three times a day (TID) | ORAL | 0 refills | Status: DC | PRN

## 2019-10-23 NOTE — Progress Notes (Signed)
10 923       Telephone visit  Subjective: CC: Recheck on chronic medicines PCP: Terald Sleeper, PA-C [id number] Kara Nunez is a 69 y.o. female calls for telephone consult today. Patient provides verbal consent for consult held via phone.  Patient is identified with 2 separate identifiers.  At this time the entire area is on COVID-19 social distancing and stay home orders are in place.  Patient is of higher risk and therefore we are performing this by a virtual method.  Location of patient: Home Location of provider: HOME Others present for call: No   This patient is have a 1 month recheck on her chronic pain and anxiety medicine.  The plan was to titrate her off her alprazolam, which she had been on for many years.  She reports that she has gone down to 1 tablet every evening.  Therefore at this time I would like her to take 1/2 tablet at bedtime for 2 weeks, and then every other day for 2 weeks and off.  She understands these instructions.  The PDMP shows her intermittent use of her pain medicine which I have encouraged her to try to work on.  And also the tapering off of her alprazolam.  Contract, 07/04/2019 Tox assure 03/23/2019  We did discuss BuSpar as a possibility for anxiety if it seems to be bothering her again in the future.  She understands and we will discuss it in the future if needed.  She does have one prescription for her pain medicine on file at the pharmacy, it was dated to fill this month.  In January I saw her in the office and had labs performed.  We will have her come in for her next visit in 6 weeks, and this 1 will be in the office.  She will need to have labs performed.  ROS: Per HPI  Allergies  Allergen Reactions  . Losartan Other (See Comments)    Her renal function will not tolerate ACEi/ARB. Please contact nephrology prior to re-challenge.   . Codeine Nausea Only  . Amoxicillin Rash   Past Medical History:  Diagnosis Date  . Allergic rhinitis   .  Ascending aortic aneurysm ([place])    Followed by Dr. [name]  . Chronic back pain   . CKD (chronic kidney disease) stage 3, GFR 30-59 ml/min   . Coronary atherosclerosis of native coronary artery    Prior evaluation with in [facility] - details not clear   . Degenerative disc disease   . Essential hypertension   . Hyperlipidemia   . Insomnia   . Type 2 diabetes mellitus ([facility])     Current Outpatient Medications:  .  albuterol (VENTOLIN HFA) 108 (90 Base) MCG/ACT inhaler, USE 2 PUFFS EVERY 6 HOURS AS NEEDED, Disp: 54 g, Rfl: 3 .  aspirin EC 81 MG tablet, Take 81 mg by mouth daily., Disp: , Rfl:  .  carvedilol (COREG) 25 MG tablet, Take by mouth., Disp: , Rfl:  .  clobetasol cream (TEMOVATE) 7.25 %, Apply 1 application topically 2 (two) times daily., Disp: 60 g, Rfl: 2 .  cloNIDine (CATAPRES) 0.2 MG tablet, TAKE ONE (1) TABLET THREE (3) TIMES EACH DAY, Disp: 270 tablet, Rfl: 1 .  donepezil (ARICEPT) 10 MG tablet, Take 1 tablet (10 mg total) by mouth at bedtime., Disp: 90 tablet, Rfl: 1 .  escitalopram (LEXAPRO) 10 MG tablet, Take 1 tablet (10 mg total) by mouth daily., Disp: 90 tablet, Rfl: 1 .  Exenatide  ER (BYDUREON BCISE) 2 MG/0.85ML AUIJ, Inject 2 mg into the skin once a week., Disp: 13 pen, Rfl: 3 .  fluorouracil (EFUDEX) 5 % cream, Apply 1 application topically 2 (two) times daily as needed (Use on face)., Disp: , Rfl:  .  furosemide (LASIX) 20 MG tablet, Take 1 tablet (20 mg total) by mouth daily., Disp: 90 tablet, Rfl: 1 .  HYDROcodone-acetaminophen (NORCO) 7.5-325 MG tablet, Take 1 tablet by mouth every 8 (eight) hours as needed for moderate pain., Disp: 90 tablet, Rfl: 0 .  HYDROcodone-acetaminophen (NORCO) 7.5-325 MG tablet, Take 1 tablet by mouth every 8 (eight) hours as needed for severe pain., Disp: 90 tablet, Rfl: 0 .  IFEREX 150 150 MG capsule, TAKE ONE (1) CAPSULE EACH DAY, Disp: 90 capsule, Rfl: 3 .  levothyroxine (SYNTHROID) 50 MCG tablet, TAKE ONE TABLET EACH MORNING  BEFORE BREAKFAST, Disp: 90 tablet, Rfl: 3 .  memantine (NAMENDA XR) 28 MG CP24 24 hr capsule, TAKE ONE (1) CAPSULE EACH DAY, Disp: 90 capsule, Rfl: 1 .  metoprolol succinate (TOPROL-XL) 100 MG 24 hr tablet, TAKE ONE (1) TABLET EACH DAY, Disp: 90 tablet, Rfl: 1 .  NIFEdipine (PROCARDIA XL/NIFEDICAL-XL) 90 MG 24 hr tablet, Take 1 tablet (90 mg total) by mouth daily., Disp: 90 tablet, Rfl: 3 .  nitroGLYCERIN (NITRODUR - DOSED IN MG/24 HR) 0.1 mg/hr patch, Place 1 patch (0.1 mg total) onto the skin daily., Disp: 90 patch, Rfl: 3 .  OLANZapine (ZYPREXA) 5 MG tablet, Take 1 tablet (5 mg total) by mouth at bedtime., Disp: 90 tablet, Rfl: 3 .  pantoprazole (PROTONIX) 40 MG tablet, Take 1 tablet (40 mg total) by mouth 2 (two) times daily., Disp: 180 tablet, Rfl: 1 .  potassium chloride (KLOR-CON) 10 MEQ tablet, Take 1 tablet (10 mEq total) by mouth 2 (two) times daily., Disp: 180 tablet, Rfl: 1 .  rosuvastatin (CRESTOR) 20 MG tablet, Take 1 tablet (20 mg total) by mouth daily., Disp: 90 tablet, Rfl: 3 .  sitaGLIPtin (JANUVIA) 100 MG tablet, TAKE ONE (1) TABLET EACH DAY, Disp: 90 tablet, Rfl: 3 .  spironolactone (ALDACTONE) 25 MG tablet, Take 1 tablet (25 mg total) by mouth daily., Disp: 90 tablet, Rfl: 1 .  triamcinolone cream (KENALOG) 0.1 %, , Disp: , Rfl:  .  Vitamin D, Ergocalciferol, (DRISDOL) 1.25 MG (50000 UT) CAPS capsule, TAKE 1 CAPSULE TWICE A WEEK, Disp: 24 capsule, Rfl: 3 .  zolpidem (AMBIEN) 10 MG tablet, Take 1 tablet (10 mg total) by mouth at bedtime as needed., Disp: 90 tablet, Rfl: 1  Assessment/ Plan: 69 y.o. female   1. Chronic right-sided thoracic back pain Continue to try to use intermittently - HYDROcodone-acetaminophen (NORCO) 7.5-325 MG tablet; Take 1 tablet by mouth every 8 (eight) hours as needed for severe pain.  Dispense: 90 tablet; Refill: 0  2. Lumbar pain Continue to try to use intermittently - HYDROcodone-acetaminophen (NORCO) 7.5-325 MG tablet; Take 1 tablet by mouth  every 8 (eight) hours as needed for severe pain.  Dispense: 90 tablet; Refill: 0  In the next month alprazolam should be completely tapered off using 1/2 tablet at bed for 2 weeks and 1/2 tablet every other day.  No follow-ups on file.  Continue all other maintenance medications as listed above.  Start time: 9:11 AM End time: 9:23 AM  Meds ordered this encounter  Medications  . HYDROcodone-acetaminophen (NORCO) 7.5-325 MG tablet    Sig: Take 1 tablet by mouth every 8 (eight) hours as needed for  severe pain.    Dispense:  90 tablet    Refill:  0    Fill 11/20/2019    Order Specific Question:   Supervising Provider    Answer:   Janora Norlander [[id number]]    [name] PA-C [facility] [phone]

## 2019-10-25 NOTE — Progress Notes (Signed)
Pardon me, just the xanax was not detected and the note reiterated there was a lapse in medication and this is supported by the [facility] database.  I still recommend repeat UDS.

## 2019-10-25 NOTE — Progress Notes (Signed)
Agree with medication tapers.  UDS in 03/2019 was negative for both medications she was supposed to be taking.  Are there plans to repeat?  Davarious Tumbleson M. Lajuana Ripple, [facility]

## 2019-10-25 NOTE — Progress Notes (Signed)
Yes, will do on a more frequent basis and next in office opportunity

## 2019-11-01 ENCOUNTER — Telehealth: Payer: Medicare HMO | Admitting: Cardiology

## 2019-11-08 ENCOUNTER — Other Ambulatory Visit: Payer: Self-pay | Admitting: Physician Assistant

## 2019-11-08 DIAGNOSIS — Z79899 Other long term (current) drug therapy: Secondary | ICD-10-CM

## 2019-11-08 DIAGNOSIS — F339 Major depressive disorder, recurrent, unspecified: Secondary | ICD-10-CM

## 2019-11-08 DIAGNOSIS — F419 Anxiety disorder, unspecified: Secondary | ICD-10-CM

## 2019-11-09 NOTE — Progress Notes (Signed)
Patient aware and order placed  

## 2019-11-09 NOTE — Progress Notes (Signed)
Sent Ref information to [employer].

## 2019-11-13 ENCOUNTER — Other Ambulatory Visit: Payer: Self-pay

## 2019-11-13 ENCOUNTER — Other Ambulatory Visit: Payer: Medicare Other

## 2019-11-13 DIAGNOSIS — E1165 Type 2 diabetes mellitus with hyperglycemia: Secondary | ICD-10-CM | POA: Diagnosis not present

## 2019-11-13 DIAGNOSIS — N182 Chronic kidney disease, stage 2 (mild): Secondary | ICD-10-CM

## 2019-11-13 DIAGNOSIS — Z79899 Other long term (current) drug therapy: Secondary | ICD-10-CM | POA: Diagnosis not present

## 2019-11-13 DIAGNOSIS — E039 Hypothyroidism, unspecified: Secondary | ICD-10-CM | POA: Diagnosis not present

## 2019-11-13 DIAGNOSIS — I2510 Atherosclerotic heart disease of native coronary artery without angina pectoris: Secondary | ICD-10-CM | POA: Diagnosis not present

## 2019-11-13 LAB — BAYER DCA HB A1C WAIVED: HB A1C (BAYER DCA - WAIVED): 6.9 % (ref <7.0)

## 2019-11-14 LAB — CBC WITH DIFFERENTIAL/PLATELET
Basophils Absolute: 0.1 10*3/uL (ref 0.0–0.2)
Basos: 1 %
EOS (ABSOLUTE): 0.2 10*3/uL (ref 0.0–0.4)
Eos: 3 %
Hematocrit: 44.1 % (ref 34.0–46.6)
Hemoglobin: 13.9 g/dL (ref 11.1–15.9)
Immature Grans (Abs): 0 10*3/uL (ref 0.0–0.1)
Immature Granulocytes: 0 %
Lymphocytes Absolute: 1.5 10*3/uL (ref 0.7–3.1)
Lymphs: 22 %
MCH: 27 pg (ref 26.6–33.0)
MCHC: 31.5 g/dL (ref 31.5–35.7)
MCV: 86 fL (ref 79–97)
Monocytes Absolute: 0.5 10*3/uL (ref 0.1–0.9)
Monocytes: 8 %
Neutrophils Absolute: 4.5 10*3/uL (ref 1.4–7.0)
Neutrophils: 66 %
Platelets: 255 10*3/uL (ref 150–450)
RBC: 5.14 x10E6/uL (ref 3.77–5.28)
RDW: 14 % (ref 11.7–15.4)
WBC: 6.8 10*3/uL (ref 3.4–10.8)

## 2019-11-14 LAB — CMP14+EGFR
ALT: 15 IU/L (ref 0–32)
AST: 16 IU/L (ref 0–40)
Albumin/Globulin Ratio: 1.6 (ref 1.2–2.2)
Albumin: 4 g/dL (ref 3.8–4.8)
Alkaline Phosphatase: 73 IU/L (ref 39–117)
BUN/Creatinine Ratio: 15 (ref 12–28)
BUN: 24 mg/dL (ref 8–27)
Bilirubin Total: <0.2 mg/dL (ref 0.0–1.2)
CO2: 20 mmol/L (ref 20–29)
Calcium: 9.7 mg/dL (ref 8.7–10.3)
Chloride: 101 mmol/L (ref 96–106)
Creatinine, Ser: 1.58 mg/dL — ABNORMAL HIGH (ref 0.57–1.00)
GFR calc Af Amer: 38 mL/min/{1.73_m2} — ABNORMAL LOW (ref >59)
GFR calc non Af Amer: 33 mL/min/{1.73_m2} — ABNORMAL LOW (ref >59)
Globulin, Total: 2.5 g/dL (ref 1.5–4.5)
Glucose: 221 mg/dL — ABNORMAL HIGH (ref 65–99)
Potassium: 4.8 mmol/L (ref 3.5–5.2)
Sodium: 135 mmol/L (ref 134–144)
Total Protein: 6.5 g/dL (ref 6.0–8.5)

## 2019-11-14 LAB — LIPID PANEL
Chol/HDL Ratio: 3 ratio (ref 0.0–4.4)
Cholesterol, Total: 153 mg/dL (ref 100–199)
HDL: 51 mg/dL (ref >39)
LDL Chol Calc (NIH): 57 mg/dL (ref 0–99)
Triglycerides: 290 mg/dL — ABNORMAL HIGH (ref 0–149)
VLDL Cholesterol Cal: 45 mg/dL — ABNORMAL HIGH (ref 5–40)

## 2019-11-14 LAB — TSH: TSH: 2.24 u[IU]/mL (ref 0.450–4.500)

## 2019-11-16 LAB — TOXASSURE SELECT 13 (MW), URINE

## 2019-11-20 ENCOUNTER — Ambulatory Visit (INDEPENDENT_AMBULATORY_CARE_PROVIDER_SITE_OTHER): Payer: Medicare Other | Admitting: Thoracic Surgery (Cardiothoracic Vascular Surgery)

## 2019-11-20 ENCOUNTER — Ambulatory Visit
Admission: RE | Admit: 2019-11-20 | Discharge: 2019-11-20 | Disposition: A | Discharge status: Home / Self Care | Payer: Medicare Other | Source: Ambulatory Visit | Attending: Thoracic Surgery (Cardiothoracic Vascular Surgery) | Admitting: Thoracic Surgery (Cardiothoracic Vascular Surgery)

## 2019-11-20 ENCOUNTER — Other Ambulatory Visit: Payer: Self-pay

## 2019-11-20 ENCOUNTER — Encounter: Payer: Self-pay | Admitting: Thoracic Surgery (Cardiothoracic Vascular Surgery)

## 2019-11-20 VITALS — BP 122/77 | HR 80 | Temp 97.7°F | Resp 16 | Ht 63.0 in | Wt 224.2 lb

## 2019-11-20 DIAGNOSIS — I712 Thoracic aortic aneurysm, without rupture, unspecified: Secondary | ICD-10-CM

## 2019-11-20 NOTE — Progress Notes (Signed)
[address]       [place] [address]             [phone]       HPI: Kara Nunez returns for a scheduled follow-up visit  Kara Nunez is a 69 year old woman with a past medical history significant for thoracic aortic atherosclerosis, coronary artery atherosclerosis, ascending aortic aneurysm, hypertension, hyperlipidemia, type 2 diabetes, stage IV chronic kidney disease, degenerative disc disease, chronic back pain, and obesity.  She was first noted to have an ascending aneurysm in 2012.  It was 4.4 cm at that time.  She has been followed since that time with minimal of any change.  I last saw her in September.  She was doing well at that time but was gaining some weight due to activity restrictions associated with COVID-19.  Her blood pressure was slightly elevated on that visit.  In the interim since her last visit she is working with a new nephrologist.  She says her blood pressures been much better controlled.  Her renal function remained stable.  She is not having any chest pain, pressure, or tightness.  Past Medical History:  Diagnosis Date  . Allergic rhinitis   . Ascending aortic aneurysm ([place])    Followed by Dr. [name]  . Chronic back pain   . CKD (chronic kidney disease) stage 3, GFR 30-59 ml/min   . Coronary atherosclerosis of native coronary artery    Prior evaluation with in [facility] - details not clear   . Degenerative disc disease   . Essential hypertension   . Hyperlipidemia   . Insomnia   . Type 2 diabetes mellitus ([place])     Current Outpatient Medications  Medication Sig Dispense Refill  . albuterol (VENTOLIN HFA) 108 (90 Base) MCG/ACT inhaler USE 2 PUFFS EVERY 6 HOURS AS NEEDED 54 g 3  . aspirin EC 81 MG tablet Take 81 mg by mouth daily.    . carvedilol (COREG) 25 MG tablet Take by mouth.    . clobetasol cream (TEMOVATE) 6.38 % Apply 1 application topically 2 (two) times daily. 60 g 2  . cloNIDine (CATAPRES) 0.2 MG tablet  TAKE ONE (1) TABLET THREE (3) TIMES EACH DAY 270 tablet 1  . donepezil (ARICEPT) 10 MG tablet Take 1 tablet (10 mg total) by mouth at bedtime. 90 tablet 1  . escitalopram (LEXAPRO) 10 MG tablet Take 1 tablet (10 mg total) by mouth daily. 90 tablet 1  . Exenatide ER (BYDUREON BCISE) 2 MG/0.85ML AUIJ Inject 2 mg into the skin once a week. 13 pen 3  . fluorouracil (EFUDEX) 5 % cream Apply 1 application topically 2 (two) times daily as needed (Use on face).    . furosemide (LASIX) 20 MG tablet Take 1 tablet (20 mg total) by mouth daily. 90 tablet 1  . HYDROcodone-acetaminophen (NORCO) 7.5-325 MG tablet Take 1 tablet by mouth every 8 (eight) hours as needed for severe pain. 90 tablet 0  . IFEREX 150 150 MG capsule TAKE ONE (1) CAPSULE EACH DAY 90 capsule 3  . levothyroxine (SYNTHROID) 50 MCG tablet TAKE ONE TABLET EACH MORNING BEFORE BREAKFAST 90 tablet 3  . memantine (NAMENDA XR) 28 MG CP24 24 hr capsule TAKE ONE (1) CAPSULE EACH DAY 90 capsule 1  . metoprolol succinate (TOPROL-XL) 100 MG 24 hr tablet TAKE ONE (1) TABLET EACH DAY 90 tablet 1  . NIFEdipine (PROCARDIA XL/NIFEDICAL-XL) 90 MG 24 hr tablet Take 1 tablet (90 mg total)  by mouth daily. 90 tablet 3  . nitroGLYCERIN (NITRODUR - DOSED IN MG/24 HR) 0.1 mg/hr patch Place 1 patch (0.1 mg total) onto the skin daily. 90 patch 3  . OLANZapine (ZYPREXA) 5 MG tablet Take 1 tablet (5 mg total) by mouth at bedtime. 90 tablet 3  . pantoprazole (PROTONIX) 40 MG tablet Take 1 tablet (40 mg total) by mouth 2 (two) times daily. 180 tablet 1  . potassium chloride (KLOR-CON) 10 MEQ tablet Take 1 tablet (10 mEq total) by mouth 2 (two) times daily. 180 tablet 1  . rosuvastatin (CRESTOR) 20 MG tablet Take 1 tablet (20 mg total) by mouth daily. 90 tablet 3  . sitaGLIPtin (JANUVIA) 100 MG tablet TAKE ONE (1) TABLET EACH DAY 90 tablet 3  . spironolactone (ALDACTONE) 25 MG tablet Take 1 tablet (25 mg total) by mouth daily. 90 tablet 1  . triamcinolone cream (KENALOG)  0.1 %     . Vitamin D, Ergocalciferol, (DRISDOL) 1.25 MG (50000 UT) CAPS capsule TAKE 1 CAPSULE TWICE A WEEK 24 capsule 3  . zolpidem (AMBIEN) 10 MG tablet Take 1 tablet (10 mg total) by mouth at bedtime as needed. 90 tablet 1   No current facility-administered medications for this visit.    Physical Exam BP 122/77 (BP Location: Left Arm, Patient Position: Sitting, Cuff Size: Large)   Pulse 80   Temp 97.7 F (36.5 C)   Resp 16   Ht 5\' 3"  (1.6 m)   Wt 224 lb 3.2 oz (101.7 kg)   SpO2 95% Comment: RA  BMI 39.49 kg/m  69 year old woman in no acute distress Alert and oriented x3 with no focal deficits Lungs clear with equal breath sounds bilaterally No carotid bruits Cardiac regular rate and rhythm with a normal S1 and S2 and no murmur No edema  Diagnostic Tests: CT CHEST WITHOUT CONTRAST  TECHNIQUE: Multidetector CT imaging of the chest was performed following the standard protocol without IV contrast.  COMPARISON:  05/21/2020  FINDINGS: Cardiovascular: Aortic atherosclerosis. Tortuous thoracic aorta. Based on transverse imaging, the aorta measures maximally 4.0 cm on 63/2, similar. Based on coronal reformats, 3.4 cm at the sinuses of Valsalva, 3.4 cm at the sinotubular junction, and 4.0 cm in the mid ascending segment. When remeasured at the same level on the prior, maximally 4.1 cm.  Normal caliber of the transverse and descending aorta. Mild cardiomegaly. Small pericardial effusion, new. Multivessel coronary artery atherosclerosis. Pulmonary artery enlargement, outflow tract 3.7 cm  Mediastinum/Nodes: No mediastinal or definite hilar adenopathy, given limitations of unenhanced CT. Tiny hiatal hernia.  Lungs/Pleura: No pleural fluid. No pleural fluid. Scattered calcified granulomas.  Upper Abdomen: Cholecystectomy. Normal imaged portions of the liver, spleen, pancreas, adrenal glands, left kidney.  Musculoskeletal: No acute osseous  abnormality.  IMPRESSION: 1. Similar borderline ascending aortic aneurysm, on the order of 4.0 cm. Recommend annual imaging followup by CTA or MRA. This recommendation follows 2010 ACCF/AHA/AATS/ACR/ASA/SCA/SCAI/SIR/STS/SVM Guidelines for the Diagnosis and Management of Patients with Thoracic Aortic Disease. Circulation. 2010; 121: K240-X735. Aortic aneurysm NOS (ICD10-I71.9) 2. Small pericardial effusion, new. This is simple in appearance, without findings to suggest hemopericardium. 3. Pulmonary artery enlargement suggests pulmonary arterial hypertension. 4. Coronary artery atherosclerosis. Aortic Atherosclerosis (ICD10-I70.0).   Electronically Signed   By: Abigail Miyamoto M.D.   On: 11/20/2019 11:57 I personally reviewed the chest CT images.  The aneurysm is 4.4 cm in greatest diameter by my measurement.  Impression: Kara Nunez is a 68 year old woman with a history of atherosclerotic cardiovascular  disease including coronary atherosclerosis, thoracic aortic atherosclerosis, and ascending aortic aneurysm, hypertension, hyperlipidemia, type 2 diabetes, and stage for chronic kidney disease.  Thoracic aortic atherosclerosis/ascending aortic aneurysm-stable at 4.4 cm.  Measures to vary between 4.4 and 4.5 cm.  She is right on the borderline for 6 months versus 1 year follow-up.  We followed up at 6 months at this time.  I discussed the options with her and she would like to follow-up at 1 year the next time.  Hypertension-blood pressure well controlled on current regimen.  Stage IV chronic kidney disease-stable and being followed by nephrology  Plan: Return in 1 year with CT chest, no contrast due to CKD  Melrose Nakayama, MD [facility] [phone]

## 2019-12-06 ENCOUNTER — Ambulatory Visit: Payer: Medicare Other | Admitting: Physician Assistant

## 2019-12-17 ENCOUNTER — Ambulatory Visit: Payer: Medicare HMO | Admitting: Physician Assistant

## 2019-12-19 ENCOUNTER — Ambulatory Visit (INDEPENDENT_AMBULATORY_CARE_PROVIDER_SITE_OTHER): Payer: Medicare Other | Admitting: Family Medicine

## 2019-12-19 ENCOUNTER — Ambulatory Visit: Payer: Medicare HMO | Admitting: Family Medicine

## 2019-12-19 ENCOUNTER — Encounter: Payer: Self-pay | Admitting: Family Medicine

## 2019-12-19 DIAGNOSIS — M25561 Pain in right knee: Secondary | ICD-10-CM

## 2019-12-19 DIAGNOSIS — S8001XA Contusion of right knee, initial encounter: Secondary | ICD-10-CM

## 2019-12-19 MED ORDER — HYDROCODONE-ACETAMINOPHEN 5-325 MG PO TABS: 1.0000 | ORAL_TABLET | Freq: Four times a day (QID) | ORAL | 0 refills | Status: DC | PRN

## 2019-12-19 NOTE — Progress Notes (Addendum)
Subjective:    Patient ID: Kara Nunez, female    DOB: 01-09-1951, 69 y.o.   MRN: [id number]   HPI: Kara Nunez is a 69 y.o. female presenting for going up a flight of stairs she was almost to the top when she felt a sharp pain in the right  knee. NKI. Out of hydrocodone. Uses it for spinal pain. Knee is swollen. Can't get any relief from the pain.  She has used her hydrocodone generally given for the back in order to deal with the severe pain in the knee.  Of note is that she now lives in [facility].  Depression screen [facility] 2/9 12/26/2019 10/10/2019 07/05/2019 07/05/2019 06/18/2019  Decreased Interest 0 2 1 1  0  Down, Depressed, Hopeless 0 0 1 1 0  PHQ - 2 Score 0 2 2 2  0  Altered sleeping - 1 1 1  -  Tired, decreased energy - 1 1 1  -  Change in appetite - 1 0 0 -  Feeling bad or failure about yourself  - 0 0 0 -  Trouble concentrating - 1 0 0 -  Moving slowly or fidgety/restless - 0 1 1 -  Suicidal thoughts - 0 0 0 -  PHQ-9 Score - 6 5 5  -  Difficult doing work/chores - Somewhat difficult Somewhat difficult Somewhat difficult -  Some recent data might be hidden     Relevant past medical, surgical, family and social history reviewed and updated as indicated.  Interim medical history since our last visit reviewed. Allergies and medications reviewed and updated.  ROS:  Review of Systems  Constitutional: Negative.   HENT: Negative.   Eyes: Negative for visual disturbance.  Respiratory: Negative for shortness of breath.   Cardiovascular: Negative for chest pain.  Gastrointestinal: Negative for abdominal pain.  Musculoskeletal: Positive for arthralgias and back pain.     Social History   Tobacco Use  Smoking Status Never Smoker  Smokeless Tobacco Never Used       Objective:     Wt Readings from Last 3 Encounters:  01/08/20 217 lb (98.4 kg)  12/26/19 220 lb 4 oz (99.9 kg)  11/20/19 224 lb 3.2 oz (101.7 kg)     Exam deferred. Pt. Harboring due  to COVID 19. Phone visit performed.   Assessment & Plan:   1. Contusion of right knee, initial encounter     Meds ordered this encounter  Medications  . DISCONTD: HYDROcodone-acetaminophen (NORCO) 5-325 MG tablet    Sig: Take 1 tablet by mouth every 6 (six) hours as needed for moderate pain. Take for moderate to severe pain    Dispense:  20 tablet    Refill:  0      Patient has been using her pain pills for her back for the acute pain in her knee.  She was cautioned about this.  However due to the severity of her pain and distress I went ahead with an acute-based dose of the hydrocodone.  This should tide her over for the next few days until we can get her in for full evaluation in the office including x-ray.  She asked me to be her physician in the future however, based on her use of multiple controlled substances, referral may be more appropriate.  That decision seems premature until I can evaluate her in the office next week.   Virtual Visit via telephone Note  I discussed the limitations, risks, security and privacy concerns of performing an evaluation  and management service by telephone and the availability of in person appointments. The patient was identified with two identifiers. Pt.expressed understanding and agreed to proceed. Pt. Is at home. Dr. Livia Snellen is in his office.  Follow Up Instructions:   I discussed the assessment and treatment plan with the patient. The patient was provided an opportunity to ask questions and all were answered. The patient agreed with the plan and demonstrated an understanding of the instructions.   The patient was advised to call back or seek an in-person evaluation if the symptoms worsen or if the condition fails to improve as anticipated.   Total minutes including chart review and phone contact time: 22   Follow up plan: No follow-ups on file.  Claretta Fraise, MD [place]

## 2019-12-26 ENCOUNTER — Encounter: Payer: Self-pay | Admitting: Family Medicine

## 2019-12-26 ENCOUNTER — Ambulatory Visit (INDEPENDENT_AMBULATORY_CARE_PROVIDER_SITE_OTHER): Payer: Medicare Other

## 2019-12-26 ENCOUNTER — Ambulatory Visit (INDEPENDENT_AMBULATORY_CARE_PROVIDER_SITE_OTHER): Payer: Medicare Other | Admitting: Family Medicine

## 2019-12-26 ENCOUNTER — Other Ambulatory Visit: Payer: Self-pay

## 2019-12-26 VITALS — BP 169/82 | HR 66 | Temp 98.2°F | Resp 20 | Ht 63.0 in | Wt 220.2 lb

## 2019-12-26 DIAGNOSIS — M51369 Other intervertebral disc degeneration, lumbar region without mention of lumbar back pain or lower extremity pain: Secondary | ICD-10-CM

## 2019-12-26 DIAGNOSIS — E1165 Type 2 diabetes mellitus with hyperglycemia: Secondary | ICD-10-CM

## 2019-12-26 DIAGNOSIS — M25461 Effusion, right knee: Secondary | ICD-10-CM | POA: Diagnosis not present

## 2019-12-26 DIAGNOSIS — F411 Generalized anxiety disorder: Secondary | ICD-10-CM

## 2019-12-26 DIAGNOSIS — E039 Hypothyroidism, unspecified: Secondary | ICD-10-CM | POA: Diagnosis not present

## 2019-12-26 DIAGNOSIS — G8929 Other chronic pain: Secondary | ICD-10-CM

## 2019-12-26 DIAGNOSIS — F339 Major depressive disorder, recurrent, unspecified: Secondary | ICD-10-CM | POA: Diagnosis not present

## 2019-12-26 DIAGNOSIS — M546 Pain in thoracic spine: Secondary | ICD-10-CM

## 2019-12-26 DIAGNOSIS — M545 Low back pain, unspecified: Secondary | ICD-10-CM

## 2019-12-26 DIAGNOSIS — M25561 Pain in right knee: Secondary | ICD-10-CM | POA: Diagnosis not present

## 2019-12-26 DIAGNOSIS — Z1159 Encounter for screening for other viral diseases: Secondary | ICD-10-CM

## 2019-12-26 DIAGNOSIS — F1320 Sedative, hypnotic or anxiolytic dependence, uncomplicated: Secondary | ICD-10-CM

## 2019-12-26 DIAGNOSIS — F1120 Opioid dependence, uncomplicated: Secondary | ICD-10-CM

## 2019-12-26 DIAGNOSIS — M5136 Other intervertebral disc degeneration, lumbar region: Secondary | ICD-10-CM | POA: Diagnosis not present

## 2019-12-26 MED ORDER — ZOLPIDEM TARTRATE 10 MG PO TABS: 10.0000 mg | ORAL_TABLET | Freq: Every evening | ORAL | 1 refills | Status: DC | PRN

## 2019-12-26 MED ORDER — HYDROCODONE-ACETAMINOPHEN 7.5-325 MG PO TABS: 1.0000 | ORAL_TABLET | Freq: Three times a day (TID) | ORAL | 0 refills | Status: DC | PRN

## 2019-12-26 NOTE — Progress Notes (Signed)
Subjective:  Patient ID: Kara Nunez,  female    DOB: 11-19-1950  Age: 69 y.o.    CC: Medical Management of Chronic Issues and Knee Pain (right)   HPI Kara Nunez presents for  follow-up of hypertension. Patient has no history of headache chest pain or shortness of breath or recent cough. Patient also denies symptoms of TIA such as numbness weakness lateralizing. Patient denies side effects from medication. States taking it regularly.  Patient also  in for follow-up of elevated cholesterol. Doing well without complaints on current medication. Denies side effects  including myalgia and arthralgia and nausea. Also in today for liver function testing. Currently no chest pain, shortness of breath or other cardiovascular related symptoms noted.Not due for A1c.  Patient has chronic neck and back pain with scoliosis and degenerative disc disease as well as spondylosis.  She is taking Norco 7.5 3 times daily long-term.  This represents a morphine milligram equivalent of 22.5.  Along with that she has also taken alprazolam in the past but she no longer takes that.  She recently discontinued it after a taper by Ms. [name], PA-C.  Additionally she takes zolpidem 10 mg at bedtime for sleep.  She has been taking the medication for an extended time after trials of numerous medications for sleep.  She cannot sleep without it.   Depression screen [facility] 2/9 12/26/2019 10/10/2019 07/05/2019 07/05/2019 06/18/2019  Decreased Interest 0 2 1 1  0  Down, Depressed, Hopeless 0 0 1 1 0  PHQ - 2 Score 0 2 2 2  0  Altered sleeping - 1 1 1  -  Tired, decreased energy - 1 1 1  -  Change in appetite - 1 0 0 -  Feeling bad or failure about yourself  - 0 0 0 -  Trouble concentrating - 1 0 0 -  Moving slowly or fidgety/restless - 0 1 1 -  Suicidal thoughts - 0 0 0 -  PHQ-9 Score - 6 5 5  -  Difficult doing work/chores - Somewhat difficult Somewhat difficult Somewhat difficult -  Some recent data might be hidden      History Kara Nunez has a past medical history of Allergic rhinitis, Ascending aortic aneurysm (HCC), Chronic back pain, CKD (chronic kidney disease) stage 3, GFR 30-59 ml/min, Coronary atherosclerosis of native coronary artery, Degenerative disc disease, Essential hypertension, Hyperlipidemia, Insomnia, and Type 2 diabetes mellitus ([place]).   She has a past surgical history that includes Cholecystectomy and Tubal ligation.   Her family history includes Allergies in her maternal grandmother and mother; Asthma in her mother; Breast cancer in her maternal grandmother; Heart disease in her maternal grandfather, maternal grandmother, mother, paternal grandfather, and paternal grandmother.She reports that she has never smoked. She has never used smokeless tobacco. She reports previous alcohol use. She reports that she does not use drugs.  Current Outpatient Medications on File Prior to Visit  Medication Sig Dispense Refill  . albuterol (VENTOLIN HFA) 108 (90 Base) MCG/ACT inhaler USE 2 PUFFS EVERY 6 HOURS AS NEEDED 54 g 3  . aspirin EC 81 MG tablet Take 81 mg by mouth daily.    . carvedilol (COREG) 25 MG tablet Take by mouth.    . clobetasol cream (TEMOVATE) 8.25 % Apply 1 application topically 2 (two) times daily. 60 g 2  . cloNIDine (CATAPRES) 0.2 MG tablet TAKE ONE (1) TABLET THREE (3) TIMES EACH DAY 270 tablet 1  . donepezil (ARICEPT) 10 MG tablet Take 1 tablet (10 mg  total) by mouth at bedtime. 90 tablet 1  . escitalopram (LEXAPRO) 10 MG tablet Take 1 tablet (10 mg total) by mouth daily. 90 tablet 1  . Exenatide ER (BYDUREON BCISE) 2 MG/0.85ML AUIJ Inject 2 mg into the skin once a week. 13 pen 3  . fluorouracil (EFUDEX) 5 % cream Apply 1 application topically 2 (two) times daily as needed (Use on face).    . furosemide (LASIX) 20 MG tablet Take 1 tablet (20 mg total) by mouth daily. 90 tablet 1  . IFEREX 150 150 MG capsule TAKE ONE (1) CAPSULE EACH DAY 90 capsule 3  . levothyroxine (SYNTHROID)  50 MCG tablet TAKE ONE TABLET EACH MORNING BEFORE BREAKFAST 90 tablet 3  . memantine (NAMENDA XR) 28 MG CP24 24 hr capsule TAKE ONE (1) CAPSULE EACH DAY 90 capsule 1  . metoprolol succinate (TOPROL-XL) 100 MG 24 hr tablet TAKE ONE (1) TABLET EACH DAY 90 tablet 1  . NIFEdipine (PROCARDIA XL/NIFEDICAL-XL) 90 MG 24 hr tablet Take 1 tablet (90 mg total) by mouth daily. 90 tablet 3  . nitroGLYCERIN (NITRODUR - DOSED IN MG/24 HR) 0.1 mg/hr patch Place 1 patch (0.1 mg total) onto the skin daily. 90 patch 3  . OLANZapine (ZYPREXA) 5 MG tablet Take 1 tablet (5 mg total) by mouth at bedtime. 90 tablet 3  . pantoprazole (PROTONIX) 40 MG tablet Take 1 tablet (40 mg total) by mouth 2 (two) times daily. 180 tablet 1  . potassium chloride (KLOR-CON) 10 MEQ tablet Take 1 tablet (10 mEq total) by mouth 2 (two) times daily. 180 tablet 1  . rosuvastatin (CRESTOR) 20 MG tablet Take 1 tablet (20 mg total) by mouth daily. 90 tablet 3  . sitaGLIPtin (JANUVIA) 100 MG tablet TAKE ONE (1) TABLET EACH DAY 90 tablet 3  . spironolactone (ALDACTONE) 25 MG tablet Take 1 tablet (25 mg total) by mouth daily. 90 tablet 1  . triamcinolone cream (KENALOG) 0.1 %     . Vitamin D, Ergocalciferol, (DRISDOL) 1.25 MG (50000 UT) CAPS capsule TAKE 1 CAPSULE TWICE A WEEK 24 capsule 3   No current facility-administered medications on file prior to visit.    ROS Review of Systems  Constitutional: Negative.   HENT: Negative for congestion.   Eyes: Negative for visual disturbance.  Respiratory: Negative for shortness of breath.   Cardiovascular: Negative for chest pain.  Gastrointestinal: Negative for abdominal pain, constipation, diarrhea, nausea and vomiting.  Genitourinary: Negative for difficulty urinating.  Musculoskeletal: Positive for arthralgias, myalgias and neck pain.  Neurological: Negative for headaches.  Psychiatric/Behavioral: Negative for sleep disturbance. The patient is nervous/anxious.     Objective:  BP (!) 169/82    Pulse 66   Temp 98.2 F (36.8 C) (Oral)   Resp 20   Ht 5\' 3"  (1.6 m)   Wt 220 lb 4 oz (99.9 kg)   SpO2 98%   BMI 39.02 kg/m   BP Readings from Last 3 Encounters:  12/26/19 (!) 169/82  11/20/19 122/77  09/18/19 101/63    Wt Readings from Last 3 Encounters:  12/26/19 220 lb 4 oz (99.9 kg)  11/20/19 224 lb 3.2 oz (101.7 kg)  09/18/19 223 lb (101.2 kg)     Physical Exam Constitutional:      General: She is not in acute distress.    Appearance: She is well-developed.  HENT:     Head: Normocephalic and atraumatic.     Right Ear: External ear normal.     Left Ear: External  ear normal.     Nose: Nose normal.  Eyes:     Conjunctiva/sclera: Conjunctivae normal.     Pupils: Pupils are equal, round, and reactive to light.  Neck:     Thyroid: No thyromegaly.  Cardiovascular:     Rate and Rhythm: Normal rate and regular rhythm.     Heart sounds: Normal heart sounds. No murmur.  Pulmonary:     Effort: Pulmonary effort is normal. No respiratory distress.     Breath sounds: Normal breath sounds. No wheezing or rales.  Abdominal:     General: Bowel sounds are normal. There is no distension.     Palpations: Abdomen is soft.     Tenderness: There is no abdominal tenderness.  Musculoskeletal:     Cervical back: Normal range of motion and neck supple.  Lymphadenopathy:     Cervical: No cervical adenopathy.  Skin:    General: Skin is warm and dry.  Neurological:     Mental Status: She is alert and oriented to person, place, and time.     Deep Tendon Reflexes: Reflexes are normal and symmetric.  Psychiatric:        Behavior: Behavior normal.        Thought Content: Thought content normal.        Judgment: Judgment normal.     Diabetic Foot Exam - Simple   Simple Foot Form Diabetic Foot exam was performed with the following findings: Yes 12/26/2019 11:00 AM  Visual Inspection No deformities, no ulcerations, no other skin breakdown bilaterally: Yes Sensation  Testing Intact to touch and monofilament testing bilaterally: Yes Pulse Check Posterior Tibialis and Dorsalis pulse intact bilaterally: Yes Comments     Assessment & Plan:   Kara Nunez was seen today for medical management of chronic issues and knee pain.  Diagnoses and all orders for this visit:  Uncontrolled type 2 diabetes mellitus with hyperglycemia ([place])  Hypothyroidism, unspecified type  GAD (generalized anxiety disorder)  Depression, recurrent (HCC)  DDD (degenerative disc disease), lumbar -     ToxASSURE Select 13 (MW), Urine  Acute pain of right knee -     DG Knee 1-2 Views Right; Future  Need for hepatitis C screening test -     Hepatitis C antibody  Sedative dependence ([place]) -     ToxASSURE Select 13 (MW), Urine  Uncomplicated opioid dependence ([place]) -     ToxASSURE Select 13 (MW), Urine  Chronic right-sided thoracic back pain -     HYDROcodone-acetaminophen (NORCO) 7.5-325 MG tablet; Take 1 tablet by mouth every 8 (eight) hours as needed for severe pain.  Lumbar pain -     HYDROcodone-acetaminophen (NORCO) 7.5-325 MG tablet; Take 1 tablet by mouth every 8 (eight) hours as needed for severe pain.  Other orders -     zolpidem (AMBIEN) 10 MG tablet; Take 1 tablet (10 mg total) by mouth at bedtime as needed.   I am having Kara Nunez maintain her aspirin EC, fluorouracil, IFerex 150, clobetasol cream, albuterol, cloNIDine, donepezil, escitalopram, furosemide, levothyroxine, metoprolol succinate, NIFEdipine, nitroGLYCERIN, OLANZapine, pantoprazole, rosuvastatin, sitaGLIPtin, spironolactone, Vitamin D (Ergocalciferol), carvedilol, Bydureon BCise, triamcinolone cream, memantine, potassium chloride, zolpidem, and HYDROcodone-acetaminophen.  Meds ordered this encounter  Medications  . zolpidem (AMBIEN) 10 MG tablet    Sig: Take 1 tablet (10 mg total) by mouth at bedtime as needed.    Dispense:  90 tablet    Refill:  1  . HYDROcodone-acetaminophen (NORCO)  7.5-325 MG tablet    Sig: Take  1 tablet by mouth every 8 (eight) hours as needed for severe pain.    Dispense:  90 tablet    Refill:  0    Fill 11/20/2019   We discussed that we would need to look at tapering her off of the zolpidem with time.  Definitely want to decrease the hydrocodone as well.  She may need to follow-up with a pain clinic and/or psychiatry with regard to her use of controlled substances.  We discussed the increased risk of blending hydrocodone and benzodiazepine like drugs.  Patient is aware of the risk.  Is trying to decrease her daily consumption of hydrocodone.  Not willing to decrease the Ambien at this time.  Follow-up: Return in about 1 month (around 01/25/2020).  Claretta Fraise, M.D.

## 2019-12-28 LAB — TOXASSURE SELECT 13 (MW), URINE

## 2019-12-29 ENCOUNTER — Encounter: Payer: Self-pay | Admitting: Family Medicine

## 2019-12-31 ENCOUNTER — Other Ambulatory Visit: Payer: Self-pay | Admitting: *Deleted

## 2019-12-31 MED ORDER — SPIRONOLACTONE 25 MG PO TABS: 25.0000 mg | ORAL_TABLET | Freq: Every day | ORAL | 3 refills | Status: DC

## 2020-01-01 ENCOUNTER — Other Ambulatory Visit: Payer: Self-pay | Admitting: *Deleted

## 2020-01-01 DIAGNOSIS — I10 Essential (primary) hypertension: Secondary | ICD-10-CM

## 2020-01-01 MED ORDER — POTASSIUM CHLORIDE ER 10 MEQ PO TBCR: 10.0000 meq | EXTENDED_RELEASE_TABLET | Freq: Two times a day (BID) | ORAL | 0 refills | Status: DC

## 2020-01-07 ENCOUNTER — Ambulatory Visit (INDEPENDENT_AMBULATORY_CARE_PROVIDER_SITE_OTHER): Payer: Medicare Other | Admitting: Family Medicine

## 2020-01-07 ENCOUNTER — Encounter: Payer: Self-pay | Admitting: Family Medicine

## 2020-01-07 DIAGNOSIS — I10 Essential (primary) hypertension: Secondary | ICD-10-CM | POA: Diagnosis not present

## 2020-01-07 DIAGNOSIS — N184 Chronic kidney disease, stage 4 (severe): Secondary | ICD-10-CM | POA: Diagnosis not present

## 2020-01-07 NOTE — Progress Notes (Signed)
    Subjective:    Patient ID: Kara Nunez, female    DOB: 1951-06-07, 69 y.o.   MRN: [id number]   HPI: DEB LOUDIN is a 69 y.o. female presenting for concern that she was supposed to have an appointment of some kind on May 6. She can't recall. So she called to ask me if I knew about it from our last visit.  Checked referrals. None are pending. I checked apt.s master schedule. She has a cardiology visit tomorrow, but nothing on May 6. My note from office visit of 4/14 references consideration of pain and psychiatry referrals, but none were ordered.  T. Is content with her current medical condition and treatments. She had no medical questions at this time.  SInce her visit was for clerical rather than clinical purposes, I could not assign an E&M code.  Claretta Fraise, MD

## 2020-01-08 ENCOUNTER — Encounter: Payer: Self-pay | Admitting: Cardiology

## 2020-01-08 ENCOUNTER — Other Ambulatory Visit: Payer: Self-pay

## 2020-01-08 ENCOUNTER — Ambulatory Visit (INDEPENDENT_AMBULATORY_CARE_PROVIDER_SITE_OTHER): Payer: Medicare Other | Admitting: Cardiology

## 2020-01-08 VITALS — BP 104/60 | HR 66 | Temp 98.9°F | Ht 64.0 in | Wt 217.0 lb

## 2020-01-08 DIAGNOSIS — I712 Thoracic aortic aneurysm, without rupture: Secondary | ICD-10-CM

## 2020-01-08 DIAGNOSIS — E782 Mixed hyperlipidemia: Secondary | ICD-10-CM

## 2020-01-08 DIAGNOSIS — I25119 Atherosclerotic heart disease of native coronary artery with unspecified angina pectoris: Secondary | ICD-10-CM

## 2020-01-08 DIAGNOSIS — N1832 Chronic kidney disease, stage 3b: Secondary | ICD-10-CM | POA: Diagnosis not present

## 2020-01-08 DIAGNOSIS — I7121 Aneurysm of the ascending aorta, without rupture: Secondary | ICD-10-CM

## 2020-01-08 NOTE — Patient Instructions (Signed)
Medication Instructions: Your physician recommends that you continue on your current medications as directed. Please refer to the Current Medication list given to you today.   Labwork: None today  Procedures/Testing: None today  Follow-Up: 1 year office visit with Dr.McDowell  Any Additional Special Instructions Will Be Listed Below (If Applicable).     If you need a refill on your cardiac medications before your next appointment, please call your pharmacy.    Call me back if you are taking Toprol XL 100 mg   [phone]

## 2020-01-08 NOTE — Addendum Note (Signed)
Addended by: Barbarann Ehlers A on: 01/08/2020 02:20 PM   Modules accepted: Orders

## 2020-01-08 NOTE — Progress Notes (Addendum)
Cardiology Office Note  Date: 01/08/2020   ID: Kara, Nunez 03/16/1951, MRN [id number]  PCP:  Kara Fraise, MD  Cardiologist:  Kara Lesches, MD Electrophysiologist:  None   Chief Complaint  Patient presents with  . Cardiac follow-up    History of Present Illness: Kara Nunez is a 69 y.o. female last seen in the office back in July 2019.  She presents overdue for follow-up.  From a cardiac perspective she does not describe any progressive angina symptoms.  She states that she had an episode of right lower costal discomfort around Christmas 2020, felt unusual for a few days and then back to baseline.  She has had no recurrence.  She had a visit with Dr. Roxan Nunez in March, I reviewed the recent chest CT report and office note.  She has been stable and remains asymptomatic.  I reviewed her medications today which are outlined below.  She is now seeing Dr. Livia Nunez.  I reviewed her recent lab work from March as well, LDL was well controlled at 57.  Most recent creatinine is 1.58.  I personally reviewed her ECG today which shows normal sinus rhythm.  Past Medical History:  Diagnosis Date  . Allergic rhinitis   . Ascending aortic aneurysm ([place])    Followed by Dr. Roxan Nunez  . Chronic back pain   . CKD (chronic kidney disease) stage 3, GFR 30-59 ml/min   . Coronary atherosclerosis of native coronary artery    Prior evaluation with in [facility] - details not clear   . Degenerative disc disease   . Essential hypertension   . Hyperlipidemia   . Insomnia   . Type 2 diabetes mellitus ([place])     Past Surgical History:  Procedure Laterality Date  . CHOLECYSTECTOMY    . TUBAL LIGATION      Current Outpatient Medications  Medication Sig Dispense Refill  . albuterol (VENTOLIN HFA) 108 (90 Base) MCG/ACT inhaler USE 2 PUFFS EVERY 6 HOURS AS NEEDED 54 g 3  . aspirin EC 81 MG tablet Take 81 mg by mouth daily.    . carvedilol (COREG) 25 MG tablet Take 25 mg by mouth  2 (two) times daily with a meal.     . clobetasol cream (TEMOVATE) 6.81 % Apply 1 application topically 2 (two) times daily. 60 g 2  . cloNIDine (CATAPRES) 0.2 MG tablet TAKE ONE (1) TABLET THREE (3) TIMES EACH DAY 270 tablet 1  . donepezil (ARICEPT) 10 MG tablet Take 1 tablet (10 mg total) by mouth at bedtime. 90 tablet 1  . escitalopram (LEXAPRO) 10 MG tablet Take 1 tablet (10 mg total) by mouth daily. 90 tablet 1  . Exenatide ER (BYDUREON BCISE) 2 MG/0.85ML AUIJ Inject 2 mg into the skin once a week. 13 pen 3  . fluorouracil (EFUDEX) 5 % cream Apply 1 application topically 2 (two) times daily as needed (Use on face).    . furosemide (LASIX) 20 MG tablet Take 1 tablet (20 mg total) by mouth daily. 90 tablet 1  . HYDROcodone-acetaminophen (NORCO) 7.5-325 MG tablet Take 1 tablet by mouth every 8 (eight) hours as needed for severe pain. 90 tablet 0  . IFEREX 150 150 MG capsule TAKE ONE (1) CAPSULE EACH DAY 90 capsule 3  . levothyroxine (SYNTHROID) 50 MCG tablet TAKE ONE TABLET EACH MORNING BEFORE BREAKFAST 90 tablet 3  . memantine (NAMENDA XR) 28 MG CP24 24 hr capsule TAKE ONE (1) CAPSULE EACH DAY 90 capsule 1  .  NIFEdipine (PROCARDIA XL/NIFEDICAL-XL) 90 MG 24 hr tablet Take 1 tablet (90 mg total) by mouth daily. 90 tablet 3  . nitroGLYCERIN (NITRODUR - DOSED IN MG/24 HR) 0.1 mg/hr patch Place 1 patch (0.1 mg total) onto the skin daily. 90 patch 3  . OLANZapine (ZYPREXA) 5 MG tablet Take 1 tablet (5 mg total) by mouth at bedtime. 90 tablet 3  . pantoprazole (PROTONIX) 40 MG tablet Take 1 tablet (40 mg total) by mouth 2 (two) times daily. 180 tablet 1  . potassium chloride (KLOR-CON) 10 MEQ tablet Take 1 tablet (10 mEq total) by mouth 2 (two) times daily. 180 tablet 0  . rosuvastatin (CRESTOR) 20 MG tablet Take 1 tablet (20 mg total) by mouth daily. 90 tablet 3  . sitaGLIPtin (JANUVIA) 100 MG tablet TAKE ONE (1) TABLET EACH DAY 90 tablet 3  . spironolactone (ALDACTONE) 25 MG tablet Take 1 tablet (25  mg total) by mouth daily. 90 tablet 3  . triamcinolone cream (KENALOG) 0.1 %     . Vitamin D, Ergocalciferol, (DRISDOL) 1.25 MG (50000 UT) CAPS capsule TAKE 1 CAPSULE TWICE A WEEK 24 capsule 3  . zolpidem (AMBIEN) 10 MG tablet Take 1 tablet (10 mg total) by mouth at bedtime as needed. 90 tablet 1  . metoprolol succinate (TOPROL-XL) 100 MG 24 hr tablet TAKE ONE (1) TABLET EACH DAY (Patient not taking: Reported on 01/08/2020) 90 tablet 1   No current facility-administered medications for this visit.   Allergies:  Losartan, Codeine, and Amoxicillin   ROS:   No palpitations or syncope.  Physical Exam: VS:  BP 104/60   Pulse 66   Temp 98.9 F (37.2 C)   Ht 5\' 4"  (1.626 m)   Wt 217 lb (98.4 kg)   SpO2 95%   BMI 37.25 kg/m , BMI Body mass index is 37.25 kg/m.  Wt Readings from Last 3 Encounters:  01/08/20 217 lb (98.4 kg)  12/26/19 220 lb 4 oz (99.9 kg)  11/20/19 224 lb 3.2 oz (101.7 kg)    General: Patient appears comfortable at rest. HEENT: Conjunctiva and lids normal, wearing a mask. Neck: Supple, no elevated JVP or carotid bruits, no thyromegaly. Lungs: Clear to auscultation, nonlabored breathing at rest. Cardiac: Regular rate and rhythm, no S3, 2/6 basal systolic murmur, no pericardial rub. Extremities: No pitting edema, distal pulses 2+.  ECG:  An ECG dated 03/28/2018 was personally reviewed today and demonstrated:  Sinus rhythm with increased voltage and left anterior fascicular block.  Recent Labwork: 11/13/2019: ALT 15; AST 16; BUN 24; Creatinine, Ser 1.58; Hemoglobin 13.9; Platelets 255; Potassium 4.8; Sodium 135; TSH 2.240     Component Value Date/Time   CHOL 153 11/13/2019 1229   TRIG 290 (H) 11/13/2019 1229   HDL 51 11/13/2019 1229   CHOLHDL 3.0 11/13/2019 1229   LDLCALC 57 11/13/2019 1229    Other Studies Reviewed Today:  Lexiscan Myoview 05/15/2017:  There was no ST segment deviation noted during stress.  Findings consistent with mild anterior ischemia and  prior small inferolateral myocardial infarction with mild peri-infarct ischemia.  The left ventricular ejection fraction is normal (55-65%).  Low to intermediate risk study. Two mild areas of ischemia as described above.  Chest CT 11/20/2019: IMPRESSION: 1. Similar borderline ascending aortic aneurysm, on the order of 4.0 cm. Recommend annual imaging followup by CTA or MRA. This recommendation follows 2010 ACCF/AHA/AATS/ACR/ASA/SCA/SCAI/SIR/STS/SVM Guidelines for the Diagnosis and Management of Patients with Thoracic Aortic Disease. Circulation. 2010; 121: K440-N027. Aortic aneurysm NOS (ICD10-I71.9)  2. Small pericardial effusion, new. This is simple in appearance, without findings to suggest hemopericardium. 3. Pulmonary artery enlargement suggests pulmonary arterial hypertension. 4. Coronary artery atherosclerosis. Aortic Atherosclerosis  Assessment and Plan:  1.  History of CAD, no reported progression in angina symptoms on current medical regimen.  Last ischemic testing was in 2018 as outlined above.  ECG today is normal.  Continue aspirin, Coreg, and Crestor.  2.  Mixed hyperlipidemia, remains on Crestor.  Recent LDL 57.  3.  CKD stage IIIb, creatinine 1.58.  4.  Asymptomatic ascending thoracic aortic aneurysm with follow-up by Dr. Roxan Nunez.  Recent chest CTA noted above with stable findings.  Medication Adjustments/Labs and Tests Ordered: Current medicines are reviewed at length with the patient today.  Concerns regarding medicines are outlined above.   Tests Ordered: Orders Placed This Encounter  Procedures  . EKG 12-Lead    Medication Changes: No orders of the defined types were placed in this encounter.   Disposition:  Follow up 1 year in the [place] office.  Signed, Satira Sark, MD, [facility] 01/08/2020 1:24 PM    [place] [facility] [address] [place], [place] [address] Phone: [phone]; Fax: [phone]

## 2020-01-11 ENCOUNTER — Other Ambulatory Visit: Payer: Self-pay | Admitting: Family Medicine

## 2020-01-11 DIAGNOSIS — R413 Other amnesia: Secondary | ICD-10-CM

## 2020-01-11 DIAGNOSIS — R600 Localized edema: Secondary | ICD-10-CM

## 2020-01-11 DIAGNOSIS — I10 Essential (primary) hypertension: Secondary | ICD-10-CM

## 2020-01-11 DIAGNOSIS — F339 Major depressive disorder, recurrent, unspecified: Secondary | ICD-10-CM

## 2020-01-11 DIAGNOSIS — F419 Anxiety disorder, unspecified: Secondary | ICD-10-CM

## 2020-01-17 DIAGNOSIS — I10 Essential (primary) hypertension: Secondary | ICD-10-CM | POA: Diagnosis not present

## 2020-01-17 DIAGNOSIS — E1122 Type 2 diabetes mellitus with diabetic chronic kidney disease: Secondary | ICD-10-CM | POA: Diagnosis not present

## 2020-01-17 DIAGNOSIS — N184 Chronic kidney disease, stage 4 (severe): Secondary | ICD-10-CM | POA: Diagnosis not present

## 2020-01-17 DIAGNOSIS — I129 Hypertensive chronic kidney disease with stage 1 through stage 4 chronic kidney disease, or unspecified chronic kidney disease: Secondary | ICD-10-CM | POA: Diagnosis not present

## 2020-01-29 ENCOUNTER — Encounter: Payer: Self-pay | Admitting: Family Medicine

## 2020-01-29 ENCOUNTER — Ambulatory Visit (INDEPENDENT_AMBULATORY_CARE_PROVIDER_SITE_OTHER): Payer: Medicare Other

## 2020-01-29 ENCOUNTER — Ambulatory Visit (INDEPENDENT_AMBULATORY_CARE_PROVIDER_SITE_OTHER): Payer: Medicare Other | Admitting: Family Medicine

## 2020-01-29 ENCOUNTER — Other Ambulatory Visit: Payer: Self-pay

## 2020-01-29 VITALS — BP 140/85 | HR 73 | Temp 97.5°F | Ht 64.0 in | Wt 218.0 lb

## 2020-01-29 DIAGNOSIS — R519 Headache, unspecified: Secondary | ICD-10-CM | POA: Diagnosis not present

## 2020-01-29 DIAGNOSIS — M25571 Pain in right ankle and joints of right foot: Secondary | ICD-10-CM

## 2020-01-29 DIAGNOSIS — M545 Low back pain, unspecified: Secondary | ICD-10-CM

## 2020-01-29 DIAGNOSIS — M546 Pain in thoracic spine: Secondary | ICD-10-CM

## 2020-01-29 DIAGNOSIS — S3992XA Unspecified injury of lower back, initial encounter: Secondary | ICD-10-CM | POA: Diagnosis not present

## 2020-01-29 DIAGNOSIS — G8929 Other chronic pain: Secondary | ICD-10-CM

## 2020-01-29 DIAGNOSIS — W19XXXA Unspecified fall, initial encounter: Secondary | ICD-10-CM | POA: Diagnosis not present

## 2020-01-29 DIAGNOSIS — M25551 Pain in right hip: Secondary | ICD-10-CM

## 2020-01-29 DIAGNOSIS — M25559 Pain in unspecified hip: Secondary | ICD-10-CM | POA: Diagnosis not present

## 2020-01-29 DIAGNOSIS — S99911A Unspecified injury of right ankle, initial encounter: Secondary | ICD-10-CM | POA: Diagnosis not present

## 2020-01-29 DIAGNOSIS — S79911A Unspecified injury of right hip, initial encounter: Secondary | ICD-10-CM | POA: Diagnosis not present

## 2020-01-29 DIAGNOSIS — S0993XA Unspecified injury of face, initial encounter: Secondary | ICD-10-CM | POA: Diagnosis not present

## 2020-01-29 DIAGNOSIS — M7989 Other specified soft tissue disorders: Secondary | ICD-10-CM | POA: Diagnosis not present

## 2020-01-29 MED ORDER — HYDROCODONE-ACETAMINOPHEN 7.5-325 MG PO TABS: 1.0000 | ORAL_TABLET | Freq: Three times a day (TID) | ORAL | 0 refills | Status: DC | PRN

## 2020-01-29 NOTE — Progress Notes (Signed)
Chief Complaint  Patient presents with  . Fall  . Hip Pain  . Ankle Pain  . Facial Pain    HPI  Patient presents today for monthly refill of her pain pills.  She has been using hydrocodone 7.5/325 3 times daily long-term with our former associate [name] as provider.  She is transferred to me starting about a month ago.  At this time she is in for 1 month refill of the hydrocodone.  She also is taking zolpidem and in the past has taken alprazolam.  Her PDMP score shows appropriate filling of these prescriptions.  However, her NARx score is 460 for narcotic, for 31 for sedative is 0 for stimulant and 300 overall.  Morphine milligram equivalent is 22.5.  Lorazepam equivalent is 1.  Just prior to presentation she was walking into the local [employer] and tripped over the curb.  She fell against the brick of the wall of the store hitting the left side of her face.  As she fell she wrenched her back and ankle as well as the right hip.  She is able to ambulate.  She has moderate pain at the left cheek with swelling.  She had no loss of consciousness.  Pain at the hip and ankle are moderate the ankle is at its baseline for chronic pain.  PMH: Smoking status noted ROS: Per HPI  Objective: BP 140/85   Pulse 73   Temp (!) 97.5 F (36.4 C) (Temporal)   Ht 5\' 4"  (1.626 m)   Wt 218 lb (98.9 kg)   BMI 37.42 kg/m  Gen: NAD, alert, cooperative with exam HEENT: NCAT, EOMI, PERRL patient has moderately severe edema with hematoma formation of the left cheek.  It is tender.  It is swollen from the corner of the nares and lip to the lateral aspect of the zygomatic process.  The right cheek is normal. CV: RRR, good S1/S2, no murmur Resp: CTABL, no wheezes, non-labored Ext: No edema, warm.  She is ambulating without a limp.  Right lower extremity has full range of motion. Neuro: Alert and oriented, No gross deficits the right lower extremity is neurovascularly intact.  Facial x-rays along  with lumbar spine hips and ankle x-rays show significant degenerative joint disease but no acute fracture. Assessment and plan:  1. Fall, initial encounter   2. Hip pain   3. Right ankle pain, unspecified chronicity   4. Facial pain   5. Low back pain, unspecified back pain laterality, unspecified chronicity, unspecified whether sciatica present   6. Chronic right-sided thoracic back pain   7. Lumbar pain     Meds ordered this encounter  Medications  . HYDROcodone-acetaminophen (NORCO) 7.5-325 MG tablet    Sig: Take 1 tablet by mouth every 8 (eight) hours as needed for severe pain.    Dispense:  90 tablet    Refill:  0    Fill 11/20/2019    Orders Placed This Encounter  Procedures  . DG Ankle Complete Right    Standing Status:   Future    Number of Occurrences:   1    Standing Expiration Date:   03/30/2021    Order Specific Question:   Reason for Exam (SYMPTOM  OR DIAGNOSIS REQUIRED)    Answer:   Ankle pain, Hip Pain, Facial pain, Fall    Order Specific Question:   Preferred imaging location?    Answer:   Internal  . DG HIP UNILAT W OR W/O PELVIS 2-3 VIEWS RIGHT  Standing Status:   Future    Number of Occurrences:   1    Standing Expiration Date:   03/29/2021    Order Specific Question:   Reason for Exam (SYMPTOM  OR DIAGNOSIS REQUIRED)    Answer:   Ankle pain, Hip Pain, Facial pain, Fall    Order Specific Question:   Preferred imaging location?    Answer:   Internal  . DG Facial Bones Complete    Standing Status:   Future    Number of Occurrences:   1    Standing Expiration Date:   03/30/2021    Order Specific Question:   Reason for Exam (SYMPTOM  OR DIAGNOSIS REQUIRED)    Answer:   Facial pain, Fall    Order Specific Question:   Preferred imaging location?    Answer:   Internal    Order Specific Question:   Radiology Contrast Protocol - do NOT remove file path    Answer:   \\[email or web address]s.pdf  . DG Lumbar Spine 2-3 Views     Standing Status:   Future    Number of Occurrences:   1    Standing Expiration Date:   03/30/2021    Order Specific Question:   Reason for Exam (SYMPTOM  OR DIAGNOSIS REQUIRED)    Answer:   low back pain, fall    Order Specific Question:   Preferred imaging location?    Answer:   Internal    Order Specific Question:   Radiology Contrast Protocol - do NOT remove file path    Answer:   \\[email or web address]s.[email or web address]    Follow up as needed.  Claretta Fraise, MD

## 2020-01-30 ENCOUNTER — Encounter: Payer: Self-pay | Admitting: Nurse Practitioner

## 2020-01-30 ENCOUNTER — Ambulatory Visit (INDEPENDENT_AMBULATORY_CARE_PROVIDER_SITE_OTHER): Payer: Medicare Other | Admitting: Nurse Practitioner

## 2020-01-30 VITALS — BP 162/91 | HR 76 | Temp 98.1°F | Resp 20 | Ht 64.0 in | Wt 218.0 lb

## 2020-01-30 DIAGNOSIS — S0012XD Contusion of left eyelid and periocular area, subsequent encounter: Secondary | ICD-10-CM | POA: Diagnosis not present

## 2020-01-30 DIAGNOSIS — S20212A Contusion of left front wall of thorax, initial encounter: Secondary | ICD-10-CM

## 2020-01-30 NOTE — Progress Notes (Signed)
   Subjective:    Patient ID: Kara Nunez, female    DOB: 08-Dec-1950, 69 y.o.   MRN: [id number]   Chief Complaint: Knot on chest from fall ([place] yesterday afternoon at [facility])   HPI Patient was at [facility] in [facility].. She stepped up on curb and fell and hit her face on a brick ledge that goes around window seal. It did not knock her out. She denies headache and blurred vision. She came in yesterday and aw DR. Tacks. He had multiple xray and no fractures were seen. Last night he felt an knot on anterior chest wall that is sore  To touch. She denie any SOB or Pain with deep breathing.   Review of Systems  Constitutional: Negative for diaphoresis.  Eyes: Negative for pain.  Respiratory: Negative for shortness of breath.   Cardiovascular: Negative for chest pain, palpitations and leg swelling.  Gastrointestinal: Negative for abdominal pain.  Endocrine: Negative for polydipsia.  Skin: Negative for rash.  Neurological: Negative for dizziness, weakness and headaches.  Hematological: Does not bruise/bleed easily.  All other systems reviewed and are negative.      Objective:   Physical Exam Vitals and nursing note reviewed.  Cardiovascular:     Rate and Rhythm: Normal rate and regular rhythm.     Heart sounds: Normal heart sounds.  Pulmonary:     Breath sounds: Normal breath sounds.  Skin:    Findings: Bruising (left ided lower olid cheek and neck contusion.. 10cm annular tender contuion on left ant chest wall.) present.  Neurological:     General: No focal deficit present.     Mental Status: She is alert and oriented to person, place, and time.  Psychiatric:        Mood and Affect: Mood normal.        Behavior: Behavior normal.   BP (!) 162/91   Pulse 76   Temp 98.1 F (36.7 C) (Temporal)   Resp 20   Ht 5\' 4"  (1.626 m)   Wt 218 lb (98.9 kg)   SpO2 90%   BMI 37.42 kg/m               Assessment & Plan:  Kara Nunez in today with chief  complaint of Knot on chest from fall (Happened 2pm yesterday afternoon at [facility])   1. Contusion of left eyelid, subsequent encounter  2. Contusion of left front wall of thorax, initial encounter Ice areas Try ot to rub area Pain med as previously prescribed RTO prn    The above assessment and management plan was discussed with the patient. The patient verbalized understanding of and has agreed to the management plan. Patient is aware to call the clinic if symptoms persist or worsen. Patient is aware when to return to the clinic for a follow-up visit. Patient educated on when it is appropriate to go to the emergency department.   Mary-Margaret Hassell Done, FNP

## 2020-01-30 NOTE — Patient Instructions (Signed)
Contusion A contusion is a deep bruise. Contusions are the result of a blunt injury to tissues and muscle fibers under the skin. The injury causes bleeding under the skin. The skin overlying the contusion may turn blue, purple, or yellow. Minor injuries will give you a painless contusion, but more severe injuries cause contusions that may stay painful and swollen for a few weeks. Follow these instructions at home: Pay attention to any changes in your symptoms. Let your health care provider know about them. Take these actions to relieve your pain. Managing pain, stiffness, and swelling   Use resting, icing, applying pressure (compression), and raising (elevating) the injured area. This is often called the RICE strategy. ? Rest the injured area. Return to your normal activities as told by your health care provider. Ask your health care provider what activities are safe for you. ? If directed, put ice on the injured area:  Put ice in a plastic bag.  Place a towel between your skin and the bag.  Leave the ice on for 20 minutes, 2-3 times per day. ? If directed, apply light compression to the injured area using an elastic bandage. Make sure the bandage is not wrapped too tightly. Remove and reapply the bandage as directed by your health care provider. ? If possible, raise (elevate) the injured area above the level of your heart while you are sitting or lying down. General instructions  Take over-the-counter and prescription medicines only as told by your health care provider.  Keep all follow-up visits as told by your health care provider. This is important. Contact a health care provider if:  Your symptoms do not improve after several days of treatment.  Your symptoms get worse.  You have difficulty moving the injured area. Get help right away if:  You have severe pain.  You have numbness in a hand or foot.  Your hand or foot turns pale or cold. Summary  A contusion is a deep  bruise.  Contusions are the result of a blunt injury to tissues and muscle fibers under the skin.  It is treated with rest, ice, compression, and elevation. You may be given over-the-counter medicines for pain.  Contact a health care provider if your symptoms do not improve, or get worse.  Get help right away if you have severe pain, have numbness, or the area turns pale or cold. This information is not intended to replace advice given to you by your health care provider. Make sure you discuss any questions you have with your health care provider. Document Revised: 04/20/2018 Document Reviewed: 04/20/2018 Elsevier Patient Education  2020 Elsevier Inc.   

## 2020-02-05 ENCOUNTER — Telehealth: Payer: Self-pay | Admitting: Family Medicine

## 2020-02-05 NOTE — Telephone Encounter (Signed)
First, call [facility] in [place] and make sure it was not filled. Then cancel it if it wasn't filled. Once this is done I can send it to [place]

## 2020-02-05 NOTE — Telephone Encounter (Signed)
Pt is wanting to see if the HYDROcodone-acetaminophen ([place]) 7.5-325 MG tablet Can be [facility] [place] [facility] the [place].

## 2020-02-05 NOTE — Telephone Encounter (Signed)
CONTROLLED meds can not be transferred.  Dr Livia Snellen, can you change pharm ?

## 2020-02-06 ENCOUNTER — Other Ambulatory Visit: Payer: Self-pay | Admitting: Family Medicine

## 2020-02-06 DIAGNOSIS — G8929 Other chronic pain: Secondary | ICD-10-CM

## 2020-02-06 DIAGNOSIS — M545 Low back pain, unspecified: Secondary | ICD-10-CM

## 2020-02-06 MED ORDER — HYDROCODONE-ACETAMINOPHEN 7.5-325 MG PO TABS: 1.0000 | ORAL_TABLET | Freq: Three times a day (TID) | ORAL | 0 refills | Status: DC | PRN

## 2020-02-06 NOTE — Telephone Encounter (Signed)
Patient aware.

## 2020-02-06 NOTE — Telephone Encounter (Signed)
[facility] not filled and she will cancel so you can send

## 2020-02-06 NOTE — Telephone Encounter (Signed)
Thanks for the help. I sent the scrip out to [place].

## 2020-02-07 ENCOUNTER — Encounter: Payer: Self-pay | Admitting: Family Medicine

## 2020-02-07 ENCOUNTER — Ambulatory Visit (INDEPENDENT_AMBULATORY_CARE_PROVIDER_SITE_OTHER): Payer: Medicare Other | Admitting: Family Medicine

## 2020-02-07 ENCOUNTER — Other Ambulatory Visit: Payer: Self-pay

## 2020-02-07 ENCOUNTER — Ambulatory Visit (INDEPENDENT_AMBULATORY_CARE_PROVIDER_SITE_OTHER): Payer: Medicare Other

## 2020-02-07 VITALS — BP 148/77 | HR 70 | Temp 97.8°F | Ht 63.75 in | Wt 216.0 lb

## 2020-02-07 DIAGNOSIS — M25512 Pain in left shoulder: Secondary | ICD-10-CM | POA: Diagnosis not present

## 2020-02-07 DIAGNOSIS — S4992XA Unspecified injury of left shoulder and upper arm, initial encounter: Secondary | ICD-10-CM | POA: Diagnosis not present

## 2020-02-07 NOTE — Progress Notes (Signed)
BP (!) 148/77   Pulse 70   Temp 97.8 F (36.6 C)   Ht 5' 3.75" (1.619 m)   Wt 216 lb (98 kg)   SpO2 95%   BMI 37.37 kg/m    Subjective:   Patient ID: Kara Nunez, female    DOB: 13-Jul-1951, 69 y.o.   MRN: [id number]  HPI: Kara Nunez is a 69 y.o. female presenting on 02/07/2020 for Knot on chest/shoulder (lots of bruising. C/O pain. Starting to get movement back.)   HPI Left shoulder pain and knot on chest. Patient comes in complaining of left shoulder pain and knot on the left side of her chest and knot on the left side of her face.  She says that she had a fall 9 days ago on the 18th where she tripped over a curb and landed against the window on a building and reached out with her arm some and since then she has been having pain on both the front side of her left chest and under her scapula in the left chest and on the left side of her face.  She has significant bruising on the left side of her face and left upper chest.  She is getting more range of motion back and she says the swelling is going down but she still has some swelling and bruising on her left face and left chest.  She went to the emergency department and they x-rayed her face and it was fine.  Relevant past medical, surgical, family and social history reviewed and updated as indicated. Interim medical history since our last visit reviewed. Allergies and medications reviewed and updated.  Review of Systems  Constitutional: Negative for chills and fever.  Eyes: Negative for visual disturbance.  Respiratory: Negative for chest tightness and shortness of breath.   Cardiovascular: Negative for chest pain and leg swelling.  Musculoskeletal: Positive for arthralgias. Negative for back pain and gait problem.  Skin: Positive for color change. Negative for rash and wound.  Neurological: Negative for light-headedness and headaches.  Psychiatric/Behavioral: Negative for agitation and behavioral problems.  All other  systems reviewed and are negative.   Per HPI unless specifically indicated above      Objective:   BP (!) 148/77   Pulse 70   Temp 97.8 F (36.6 C)   Ht 5' 3.75" (1.619 m)   Wt 216 lb (98 kg)   SpO2 95%   BMI 37.37 kg/m   Wt Readings from Last 3 Encounters:  02/07/20 216 lb (98 kg)  01/30/20 218 lb (98.9 kg)  01/29/20 218 lb (98.9 kg)    Physical Exam Vitals and nursing note reviewed.  Constitutional:      General: She is not in acute distress.    Appearance: She is well-developed. She is not diaphoretic.  Eyes:     Conjunctiva/sclera: Conjunctivae normal.  Musculoskeletal:        General: No tenderness. Normal range of motion.  Skin:    General: Skin is warm and dry.     Findings: Bruising and ecchymosis present. No rash.       Neurological:     Mental Status: She is alert and oriented to person, place, and time.     Coordination: Coordination normal.  Psychiatric:        Behavior: Behavior normal.     Shoulder x-ray left: No acute bony abnormality noted, await final read from radiology  Assessment & Plan:   Problem List Items Addressed This Visit  None    Visit Diagnoses    Acute pain of left shoulder    -  Primary   Relevant Orders   DG Shoulder Left      Deep contusion from fall, no bony abnormalities Follow up plan: Return if symptoms worsen or fail to improve.  Counseling provided for all of the vaccine components Orders Placed This Encounter  Procedures  . DG Shoulder Left    Caryl Pina, MD [facility] 02/07/2020, 11:04 AM

## 2020-02-15 ENCOUNTER — Ambulatory Visit (INDEPENDENT_AMBULATORY_CARE_PROVIDER_SITE_OTHER): Payer: Medicare Other | Admitting: Family Medicine

## 2020-02-15 ENCOUNTER — Encounter: Payer: Self-pay | Admitting: Family Medicine

## 2020-02-15 ENCOUNTER — Other Ambulatory Visit: Payer: Self-pay

## 2020-02-15 VITALS — BP 149/75 | HR 79 | Temp 96.6°F | Ht 63.75 in | Wt 209.0 lb

## 2020-02-15 DIAGNOSIS — M25512 Pain in left shoulder: Secondary | ICD-10-CM

## 2020-02-15 MED ORDER — METHYLPREDNISOLONE ACETATE 80 MG/ML IJ SUSP
80.0000 mg | Freq: Once | INTRAMUSCULAR | Status: AC
  Administered 2020-02-15: 80 mg via INTRAMUSCULAR

## 2020-02-15 NOTE — Progress Notes (Signed)
BP (!) 149/75   Pulse 79   Temp (!) 96.6 F (35.9 C)   Ht 5' 3.75" (1.619 m)   Wt 209 lb (94.8 kg)   SpO2 94%   BMI 36.16 kg/m    Subjective:   Patient ID: Kara Nunez, female    DOB: 1950-11-15, 69 y.o.   MRN: [id number]  HPI: Kara Nunez is a 69 y.o. female presenting on 02/15/2020 for Shoulder Pain (left shoulder. 4w re check. C/O shoulder pain that aches. Radiates down to left hand causing numbness and tingling)   HPI Left shoulder pain and tingling going down her left hand.  It has slightly improved from previous.  She has been doing some stretching and exercise with the.  She did have an x-ray last time and the x-ray showed some effusion around the shoulder but nothing else significant.  Patient denies any weakness but does have that numbness going down the lateral aspect of her upper arm.  Relevant past medical, surgical, family and social history reviewed and updated as indicated. Interim medical history since our last visit reviewed. Allergies and medications reviewed and updated.  Review of Systems  Constitutional: Negative for chills and fever.  Eyes: Negative for visual disturbance.  Respiratory: Negative for chest tightness and shortness of breath.   Cardiovascular: Negative for chest pain and leg swelling.  Musculoskeletal: Positive for arthralgias. Negative for back pain and gait problem.  Skin: Negative for rash.  Neurological: Negative for light-headedness and headaches.  Psychiatric/Behavioral: Negative for agitation and behavioral problems.  All other systems reviewed and are negative.   Per HPI unless specifically indicated above      Objective:   BP (!) 149/75   Pulse 79   Temp (!) 96.6 F (35.9 C)   Ht 5' 3.75" (1.619 m)   Wt 209 lb (94.8 kg)   SpO2 94%   BMI 36.16 kg/m   Wt Readings from Last 3 Encounters:  02/15/20 209 lb (94.8 kg)  02/07/20 216 lb (98 kg)  01/30/20 218 lb (98.9 kg)    Physical Exam Vitals and nursing note  reviewed.  Constitutional:      General: She is not in acute distress.    Appearance: Normal appearance. She is well-developed. She is not diaphoretic.  Eyes:     Conjunctiva/sclera: Conjunctivae normal.  Musculoskeletal:     Left shoulder: Tenderness (Posterior tenderness) and crepitus present. No deformity. Normal range of motion.  Skin:    General: Skin is warm and dry.     Findings: No rash.  Neurological:     Mental Status: She is alert and oriented to person, place, and time.     Coordination: Coordination normal.     Left subacromial injection: Consent form signed. Risk factors of bleeding and infection discussed with patient and patient is agreeable towards injection. Patient prepped with Betadine. Lateral approach towards injection used. Injected 80 mg of Depo-Medrol and 1 mL of 2% lidocaine. Patient tolerated procedure well and no side effects from noted. Minimal to no bleeding. Simple bandage applied after.   Assessment & Plan:   Problem List Items Addressed This Visit    None    Visit Diagnoses    Acute pain of left shoulder    -  Primary   Relevant Medications   methylPREDNISolone acetate (DEPO-MEDROL) injection 80 mg (Start on 02/15/2020  5:00 PM)      Will do injection to see if improves, possibly bursitis versus impingement syndrome Follow up plan:  Return if symptoms worsen or fail to improve.  Counseling provided for all of the vaccine components No orders of the defined types were placed in this encounter.   Caryl Pina, MD [facility] 02/15/2020, 4:30 PM

## 2020-03-11 ENCOUNTER — Other Ambulatory Visit: Payer: Self-pay

## 2020-03-11 ENCOUNTER — Ambulatory Visit (INDEPENDENT_AMBULATORY_CARE_PROVIDER_SITE_OTHER): Payer: Medicare Other | Admitting: Family Medicine

## 2020-03-11 ENCOUNTER — Encounter: Payer: Self-pay | Admitting: Family Medicine

## 2020-03-11 VITALS — BP 138/86 | HR 63 | Temp 97.5°F | Ht 63.75 in | Wt 214.2 lb

## 2020-03-11 DIAGNOSIS — F411 Generalized anxiety disorder: Secondary | ICD-10-CM

## 2020-03-11 DIAGNOSIS — E1165 Type 2 diabetes mellitus with hyperglycemia: Secondary | ICD-10-CM

## 2020-03-11 DIAGNOSIS — E039 Hypothyroidism, unspecified: Secondary | ICD-10-CM | POA: Diagnosis not present

## 2020-03-11 DIAGNOSIS — M25512 Pain in left shoulder: Secondary | ICD-10-CM

## 2020-03-11 DIAGNOSIS — F419 Anxiety disorder, unspecified: Secondary | ICD-10-CM

## 2020-03-11 DIAGNOSIS — I10 Essential (primary) hypertension: Secondary | ICD-10-CM | POA: Diagnosis not present

## 2020-03-11 DIAGNOSIS — R600 Localized edema: Secondary | ICD-10-CM

## 2020-03-11 DIAGNOSIS — R413 Other amnesia: Secondary | ICD-10-CM

## 2020-03-11 DIAGNOSIS — F339 Major depressive disorder, recurrent, unspecified: Secondary | ICD-10-CM | POA: Diagnosis not present

## 2020-03-11 DIAGNOSIS — E782 Mixed hyperlipidemia: Secondary | ICD-10-CM

## 2020-03-11 LAB — BAYER DCA HB A1C WAIVED: HB A1C (BAYER DCA - WAIVED): 6.3 % (ref <7.0)

## 2020-03-11 MED ORDER — LEVOTHYROXINE SODIUM 50 MCG PO TABS: ORAL_TABLET | ORAL | 3 refills | Status: AC

## 2020-03-11 MED ORDER — POTASSIUM CHLORIDE ER 10 MEQ PO TBCR: 10.0000 meq | EXTENDED_RELEASE_TABLET | Freq: Two times a day (BID) | ORAL | 0 refills | Status: DC

## 2020-03-11 MED ORDER — CLONIDINE HCL 0.2 MG PO TABS: ORAL_TABLET | ORAL | 1 refills | Status: AC

## 2020-03-11 MED ORDER — OLANZAPINE 5 MG PO TABS: 5.0000 mg | ORAL_TABLET | Freq: Every day | ORAL | 3 refills | Status: AC

## 2020-03-11 MED ORDER — SITAGLIPTIN PHOSPHATE 100 MG PO TABS: ORAL_TABLET | ORAL | 3 refills | Status: AC

## 2020-03-11 MED ORDER — POLYSACCHARIDE IRON COMPLEX 150 MG PO CAPS: ORAL_CAPSULE | ORAL | 3 refills | Status: AC

## 2020-03-11 MED ORDER — DONEPEZIL HCL 10 MG PO TABS: ORAL_TABLET | ORAL | 1 refills | Status: DC

## 2020-03-11 MED ORDER — BYDUREON BCISE 2 MG/0.85ML ~~LOC~~ AUIJ: 2.0000 mg | AUTO-INJECTOR | SUBCUTANEOUS | 3 refills | Status: AC

## 2020-03-11 MED ORDER — VITAMIN D (ERGOCALCIFEROL) 1.25 MG (50000 UNIT) PO CAPS: ORAL_CAPSULE | ORAL | 3 refills | Status: AC

## 2020-03-11 MED ORDER — PANTOPRAZOLE SODIUM 40 MG PO TBEC: 40.0000 mg | DELAYED_RELEASE_TABLET | Freq: Two times a day (BID) | ORAL | 0 refills | Status: DC

## 2020-03-11 MED ORDER — ESCITALOPRAM OXALATE 10 MG PO TABS: 10.0000 mg | ORAL_TABLET | Freq: Every day | ORAL | 1 refills | Status: DC

## 2020-03-11 MED ORDER — BETAMETHASONE SOD PHOS & ACET 6 (3-3) MG/ML IJ SUSP
6.0000 mg | Freq: Once | INTRAMUSCULAR | Status: AC
  Administered 2020-03-11: 6 mg via INTRAMUSCULAR

## 2020-03-11 MED ORDER — MEMANTINE HCL ER 28 MG PO CP24: ORAL_CAPSULE | ORAL | 1 refills | Status: DC

## 2020-03-11 MED ORDER — SPIRONOLACTONE 25 MG PO TABS: 25.0000 mg | ORAL_TABLET | Freq: Every day | ORAL | 3 refills | Status: AC

## 2020-03-11 MED ORDER — NIFEDIPINE ER OSMOTIC RELEASE 90 MG PO TB24: 90.0000 mg | ORAL_TABLET | Freq: Every day | ORAL | 3 refills | Status: AC

## 2020-03-11 MED ORDER — ALBUTEROL SULFATE HFA 108 (90 BASE) MCG/ACT IN AERS: INHALATION_SPRAY | RESPIRATORY_TRACT | 3 refills | Status: AC

## 2020-03-11 MED ORDER — ROSUVASTATIN CALCIUM 20 MG PO TABS: 20.0000 mg | ORAL_TABLET | Freq: Every day | ORAL | 3 refills | Status: DC

## 2020-03-11 MED ORDER — FUROSEMIDE 20 MG PO TABS: 20.0000 mg | ORAL_TABLET | Freq: Every day | ORAL | 3 refills | Status: DC

## 2020-03-11 MED ORDER — NITROGLYCERIN 0.1 MG/HR TD PT24: 0.1000 mg | MEDICATED_PATCH | Freq: Every day | TRANSDERMAL | 3 refills | Status: AC

## 2020-03-11 MED ORDER — CARVEDILOL 25 MG PO TABS: 25.0000 mg | ORAL_TABLET | Freq: Two times a day (BID) | ORAL | 3 refills | Status: AC

## 2020-03-11 NOTE — Progress Notes (Signed)
Subjective:  Patient ID: Kara Nunez, female    DOB: [birth year]  Age: 69 y.o. MRN: [id number]  CC: Follow-up   HPI Kara Nunez presents for follow-up of diabetes. Patient does not check blood sugar at home Patient denies symptoms such as polyuria, polydipsia, excessive hunger, nausea No significant hypoglycemic spells noted. Medications as noted below. Taking them regularly without complication/adverse reaction being reported today.  Patient continues to have a great deal of pain in the left shoulder.  She had an injection about 3 weeks ago with Dr. Warrick Nunez.  The pain went away after a couple of days.  But it came back about 2 days ago.  It is as bad as ever.  It does have a moderately severe ache with some sharp sensation.  It is in the superior aspect of the left shoulder radiating into the deltoid region.  Patient is having a lot of trouble managing her medications.  She request those be sent to pill pack  Depression screen [facility] 2/9 03/11/2020 02/15/2020 02/07/2020  Decreased Interest 0 0 0  Down, Depressed, Hopeless 0 0 0  PHQ - 2 Score 0 0 0  Altered sleeping 3 - -  Tired, decreased energy 3 - -  Change in appetite 0 - -  Feeling bad or failure about yourself  0 - -  Trouble concentrating 0 - -  Moving slowly or fidgety/restless 0 - -  Suicidal thoughts 0 - -  PHQ-9 Score 6 - -  Difficult doing work/chores Somewhat difficult - -  Some recent data might be hidden    History Kara Nunez has a past medical history of Allergic rhinitis, Ascending aortic aneurysm (HCC), Chronic back pain, CKD (chronic kidney disease) stage 3, GFR 30-59 ml/min, Coronary atherosclerosis of native coronary artery, Degenerative disc disease, Essential hypertension, Hyperlipidemia, Insomnia, and Type 2 diabetes mellitus ([place]).   She has a past surgical history that includes Cholecystectomy and Tubal ligation.   Her family history includes Allergies in her maternal grandmother and mother; Asthma in her  mother; Breast cancer in her maternal grandmother; Heart disease in her maternal grandfather, maternal grandmother, mother, paternal grandfather, and paternal grandmother.She reports that she has never smoked. She has never used smokeless tobacco. She reports previous alcohol use. She reports that she does not use drugs.    ROS Review of Systems  Constitutional: Negative.   HENT: Negative.   Eyes: Negative for visual disturbance.  Respiratory: Negative for shortness of breath.   Cardiovascular: Negative for chest pain.  Gastrointestinal: Negative for abdominal pain.  Musculoskeletal: Positive for arthralgias and back pain.    Objective:  BP 138/86   Pulse 63   Temp (!) 97.5 F (36.4 C) (Temporal)   Ht 5' 3.75" (1.619 m)   Wt 214 lb 3.2 oz (97.2 kg)   BMI 37.06 kg/m   BP Readings from Last 3 Encounters:  03/11/20 138/86  02/15/20 (!) 149/75  02/07/20 (!) 148/77    Wt Readings from Last 3 Encounters:  03/11/20 214 lb 3.2 oz (97.2 kg)  02/15/20 209 lb (94.8 kg)  02/07/20 216 lb (98 kg)     Physical Exam Constitutional:      General: She is not in acute distress.    Appearance: She is well-developed.  Cardiovascular:     Rate and Rhythm: Normal rate and regular rhythm.  Pulmonary:     Breath sounds: Normal breath sounds.  Skin:    General: Skin is warm and dry.  Neurological:  Mental Status: She is alert and oriented to person, place, and time.       Assessment & Plan:   Kara Nunez was seen today for follow-up.  Diagnoses and all orders for this visit:  Uncontrolled type 2 diabetes mellitus with hyperglycemia ([place]) -     CBC with Differential/Platelet -     CMP14+EGFR -     Lipid panel -     Bayer DCA Hb A1c Waived -     Exenatide ER (BYDUREON BCISE) 2 MG/0.85ML AUIJ; Inject 2 mg into the skin once a week.  Hypothyroidism, unspecified type -     CBC with Differential/Platelet -     CMP14+EGFR -     Lipid panel -     Thyroid Panel With TSH  GAD  (generalized anxiety disorder) -     CBC with Differential/Platelet -     CMP14+EGFR  Depression, recurrent (HCC) -     CBC with Differential/Platelet -     CMP14+EGFR -     escitalopram (LEXAPRO) 10 MG tablet; Take 1 tablet (10 mg total) by mouth daily.  Essential hypertension -     CBC with Differential/Platelet -     CMP14+EGFR -     Lipid panel -     cloNIDine (CATAPRES) 0.2 MG tablet; TAKE ONE (1) TABLET THREE (3) TIMES EACH DAY -     furosemide (LASIX) 20 MG tablet; Take 1 tablet (20 mg total) by mouth daily. -     NIFEdipine (PROCARDIA XL/NIFEDICAL-XL) 90 MG 24 hr tablet; Take 1 tablet (90 mg total) by mouth daily. -     potassium chloride (KLOR-CON) 10 MEQ tablet; Take 1 tablet (10 mEq total) by mouth 2 (two) times daily.  Mixed hyperlipidemia -     CBC with Differential/Platelet -     CMP14+EGFR -     Lipid panel  Acute pain of left shoulder -     betamethasone acetate-betamethasone sodium phosphate (CELESTONE) injection 6 mg  Memory loss -     donepezil (ARICEPT) 10 MG tablet; TAKE 1 TABLET BY MOUTH EVERYDAY AT BEDTIME -     memantine (NAMENDA XR) 28 MG CP24 24 hr capsule; TAKE ONE (1) CAPSULE EACH DAY  Anxiety -     escitalopram (LEXAPRO) 10 MG tablet; Take 1 tablet (10 mg total) by mouth daily.  Localized edema -     furosemide (LASIX) 20 MG tablet; Take 1 tablet (20 mg total) by mouth daily.  Other orders -     albuterol (VENTOLIN HFA) 108 (90 Base) MCG/ACT inhaler; USE 2 PUFFS EVERY 6 HOURS AS NEEDED -     carvedilol (COREG) 25 MG tablet; Take 1 tablet (25 mg total) by mouth 2 (two) times daily with a meal. -     iron polysaccharides (IFEREX 150) 150 MG capsule; TAKE ONE (1) CAPSULE EACH DAY -     levothyroxine (SYNTHROID) 50 MCG tablet; TAKE ONE TABLET EACH MORNING BEFORE BREAKFAST -     nitroGLYCERIN (NITRODUR - DOSED IN MG/24 HR) 0.1 mg/hr patch; Place 1 patch (0.1 mg total) onto the skin daily. -     OLANZapine (ZYPREXA) 5 MG tablet; Take 1 tablet (5 mg  total) by mouth at bedtime. -     pantoprazole (PROTONIX) 40 MG tablet; Take 1 tablet (40 mg total) by mouth 2 (two) times daily. -     rosuvastatin (CRESTOR) 20 MG tablet; Take 1 tablet (20 mg total) by mouth daily. -     sitaGLIPtin (  JANUVIA) 100 MG tablet; TAKE ONE (1) TABLET EACH DAY -     spironolactone (ALDACTONE) 25 MG tablet; Take 1 tablet (25 mg total) by mouth daily. -     Vitamin D, Ergocalciferol, (DRISDOL) 1.25 MG (50000 UNIT) CAPS capsule; TAKE 1 CAPSULE TWICE A WEEK       I have discontinued Kara Nunez's fluorouracil. I have changed her IFerex 150 to iron polysaccharides. I have also changed her carvedilol, escitalopram, furosemide, pantoprazole, and Vitamin D (Ergocalciferol). Additionally, I am having her maintain her aspirin EC, clobetasol cream, triamcinolone cream, zolpidem, HYDROcodone-acetaminophen, albuterol, cloNIDine, donepezil, Bydureon BCise, levothyroxine, memantine, NIFEdipine, nitroGLYCERIN, OLANZapine, potassium chloride, rosuvastatin, sitaGLIPtin, and spironolactone. We administered betamethasone acetate-betamethasone sodium phosphate.  Allergies as of 03/11/2020      Reactions   Losartan Other (See Comments)   Her renal function will not tolerate ACEi/ARB. Please contact nephrology prior to re-challenge.    Codeine Nausea Only   Amoxicillin Rash      Medication List       Accurate as of March 11, 2020 12:44 PM. If you have any questions, ask your nurse or doctor.        STOP taking these medications   fluorouracil 5 % cream Commonly known as: EFUDEX Stopped by: Claretta Fraise, MD     TAKE these medications   albuterol 108 (90 Base) MCG/ACT inhaler Commonly known as: Ventolin HFA USE 2 PUFFS EVERY 6 HOURS AS NEEDED   aspirin EC 81 MG tablet Take 81 mg by mouth daily.   Bydureon BCise 2 MG/0.85ML Auij Generic drug: Exenatide ER Inject 2 mg into the skin once a week.   carvedilol 25 MG tablet Commonly known as: COREG Take 1 tablet (25  mg total) by mouth 2 (two) times daily with a meal.   clobetasol cream 0.05 % Commonly known as: TEMOVATE Apply 1 application topically 2 (two) times daily.   cloNIDine 0.2 MG tablet Commonly known as: CATAPRES TAKE ONE (1) TABLET THREE (3) TIMES EACH DAY   donepezil 10 MG tablet Commonly known as: ARICEPT TAKE 1 TABLET BY MOUTH EVERYDAY AT BEDTIME   escitalopram 10 MG tablet Commonly known as: LEXAPRO Take 1 tablet (10 mg total) by mouth daily.   furosemide 20 MG tablet Commonly known as: LASIX Take 1 tablet (20 mg total) by mouth daily.   HYDROcodone-acetaminophen 7.5-325 MG tablet Commonly known as: Norco Take 1 tablet by mouth every 8 (eight) hours as needed for severe pain.   iron polysaccharides 150 MG capsule Commonly known as: IFerex 150 TAKE ONE (1) CAPSULE EACH DAY   levothyroxine 50 MCG tablet Commonly known as: SYNTHROID TAKE ONE TABLET EACH MORNING BEFORE BREAKFAST   memantine 28 MG Cp24 24 hr capsule Commonly known as: NAMENDA XR TAKE ONE (1) CAPSULE EACH DAY   NIFEdipine 90 MG 24 hr tablet Commonly known as: PROCARDIA XL/NIFEDICAL-XL Take 1 tablet (90 mg total) by mouth daily.   nitroGLYCERIN 0.1 mg/hr patch Commonly known as: NITRODUR - Dosed in mg/24 hr Place 1 patch (0.1 mg total) onto the skin daily.   OLANZapine 5 MG tablet Commonly known as: ZYPREXA Take 1 tablet (5 mg total) by mouth at bedtime.   pantoprazole 40 MG tablet Commonly known as: PROTONIX Take 1 tablet (40 mg total) by mouth 2 (two) times daily.   potassium chloride 10 MEQ tablet Commonly known as: KLOR-CON Take 1 tablet (10 mEq total) by mouth 2 (two) times daily.   rosuvastatin 20 MG tablet Commonly known  as: CRESTOR Take 1 tablet (20 mg total) by mouth daily.   sitaGLIPtin 100 MG tablet Commonly known as: Januvia TAKE ONE (1) TABLET EACH DAY   spironolactone 25 MG tablet Commonly known as: ALDACTONE Take 1 tablet (25 mg total) by mouth daily.   triamcinolone  cream 0.1 % Commonly known as: KENALOG   Vitamin D (Ergocalciferol) 1.25 MG (50000 UNIT) Caps capsule Commonly known as: DRISDOL TAKE 1 CAPSULE TWICE A WEEK   zolpidem 10 MG tablet Commonly known as: AMBIEN Take 1 tablet (10 mg total) by mouth at bedtime as needed.        Follow-up: Return in about 1 month (around 04/10/2020).  Claretta Fraise, M.D.

## 2020-03-12 LAB — CMP14+EGFR
ALT: 8 IU/L (ref 0–32)
AST: 12 IU/L (ref 0–40)
Albumin/Globulin Ratio: 1.9 (ref 1.2–2.2)
Albumin: 4.1 g/dL (ref 3.7–4.7)
Alkaline Phosphatase: 78 IU/L (ref 48–121)
BUN/Creatinine Ratio: 14 (ref 12–28)
BUN: 20 mg/dL (ref 8–27)
Bilirubin Total: <0.2 mg/dL (ref 0.0–1.2)
CO2: 21 mmol/L (ref 20–29)
Calcium: 9.2 mg/dL (ref 8.7–10.3)
Chloride: 102 mmol/L (ref 96–106)
Creatinine, Ser: 1.46 mg/dL — ABNORMAL HIGH (ref 0.57–1.00)
GFR calc Af Amer: 41 mL/min/{1.73_m2} — ABNORMAL LOW (ref >59)
GFR calc non Af Amer: 36 mL/min/{1.73_m2} — ABNORMAL LOW (ref >59)
Globulin, Total: 2.2 g/dL (ref 1.5–4.5)
Glucose: 91 mg/dL (ref 65–99)
Potassium: 3.7 mmol/L (ref 3.5–5.2)
Sodium: 140 mmol/L (ref 134–144)
Total Protein: 6.3 g/dL (ref 6.0–8.5)

## 2020-03-12 LAB — CBC WITH DIFFERENTIAL/PLATELET
Basophils Absolute: 0.1 10*3/uL (ref 0.0–0.2)
Basos: 1 %
EOS (ABSOLUTE): 0.2 10*3/uL (ref 0.0–0.4)
Eos: 3 %
Hematocrit: 40.7 % (ref 34.0–46.6)
Hemoglobin: 13.6 g/dL (ref 11.1–15.9)
Immature Grans (Abs): 0 10*3/uL (ref 0.0–0.1)
Immature Granulocytes: 0 %
Lymphocytes Absolute: 1.6 10*3/uL (ref 0.7–3.1)
Lymphs: 24 %
MCH: 29.4 pg (ref 26.6–33.0)
MCHC: 33.4 g/dL (ref 31.5–35.7)
MCV: 88 fL (ref 79–97)
Monocytes Absolute: 0.6 10*3/uL (ref 0.1–0.9)
Monocytes: 9 %
Neutrophils Absolute: 4.3 10*3/uL (ref 1.4–7.0)
Neutrophils: 63 %
Platelets: 227 10*3/uL (ref 150–450)
RBC: 4.62 x10E6/uL (ref 3.77–5.28)
RDW: 14.9 % (ref 11.7–15.4)
WBC: 6.8 10*3/uL (ref 3.4–10.8)

## 2020-03-12 LAB — THYROID PANEL WITH TSH
Free Thyroxine Index: 1.7 (ref 1.2–4.9)
T3 Uptake Ratio: 28 % (ref 24–39)
T4, Total: 6.2 ug/dL (ref 4.5–12.0)
TSH: 2.97 u[IU]/mL (ref 0.450–4.500)

## 2020-03-12 LAB — LIPID PANEL
Chol/HDL Ratio: 2.5 ratio (ref 0.0–4.4)
Cholesterol, Total: 135 mg/dL (ref 100–199)
HDL: 55 mg/dL (ref >39)
LDL Chol Calc (NIH): 48 mg/dL (ref 0–99)
Triglycerides: 202 mg/dL — ABNORMAL HIGH (ref 0–149)
VLDL Cholesterol Cal: 32 mg/dL (ref 5–40)

## 2020-03-12 NOTE — Progress Notes (Signed)
Hello [name],  Your lab result is normal and/or stable.Some minor variations that are not significant are commonly marked abnormal, but do not represent any medical problem for you.  Best regards, Claretta Fraise, M.D.

## 2020-03-21 ENCOUNTER — Ambulatory Visit (INDEPENDENT_AMBULATORY_CARE_PROVIDER_SITE_OTHER): Payer: Medicare Other | Admitting: Family Medicine

## 2020-03-21 ENCOUNTER — Telehealth: Payer: Self-pay | Admitting: Family Medicine

## 2020-03-21 NOTE — Telephone Encounter (Signed)
Agreed. WS

## 2020-03-21 NOTE — Telephone Encounter (Signed)
  Prescription Request  03/21/2020  What is the name of the medication or equipment? AMBIEN  Have you contacted your pharmacy to request a refill? (if applicable) Yes, pt is out of this medication missed her televisit this morning is scheduled with PCP next week  Which pharmacy would you like this sent to? [facility]   Patient notified that their request is being sent to the clinical staff for review and that they should receive a response within 2 business days.

## 2020-03-21 NOTE — Telephone Encounter (Signed)
Was advise needed a visit.

## 2020-03-22 ENCOUNTER — Encounter: Payer: Self-pay | Admitting: Family Medicine

## 2020-03-22 NOTE — Progress Notes (Signed)
Patient did not keep appointment.  Multiple attempts to call were not answered.  Erroneous erroneous

## 2020-03-24 NOTE — Telephone Encounter (Signed)
Patient has schedule appointment.

## 2020-03-27 ENCOUNTER — Ambulatory Visit (INDEPENDENT_AMBULATORY_CARE_PROVIDER_SITE_OTHER): Payer: Medicare Other | Admitting: Family Medicine

## 2020-03-27 ENCOUNTER — Encounter: Payer: Self-pay | Admitting: Family Medicine

## 2020-03-27 ENCOUNTER — Other Ambulatory Visit: Payer: Self-pay

## 2020-03-27 VITALS — BP 132/78 | HR 81 | Temp 98.1°F | Resp 20 | Ht 63.75 in | Wt 215.0 lb

## 2020-03-27 DIAGNOSIS — F99 Mental disorder, not otherwise specified: Secondary | ICD-10-CM

## 2020-03-27 DIAGNOSIS — F5105 Insomnia due to other mental disorder: Secondary | ICD-10-CM | POA: Diagnosis not present

## 2020-03-27 MED ORDER — ZOLPIDEM TARTRATE 10 MG PO TABS: 10.0000 mg | ORAL_TABLET | Freq: Every evening | ORAL | 5 refills | Status: DC | PRN

## 2020-03-31 ENCOUNTER — Encounter: Payer: Self-pay | Admitting: Family Medicine

## 2020-03-31 NOTE — Progress Notes (Signed)
Subjective:  Patient ID: Kara Nunez, female    DOB: [birth year]  Age: 69 y.o. MRN: [id number]  CC: No chief complaint on file.   HPI Kara Nunez presents for loss of sleep as a result of running out of her Ambien.  She is in today for refills of that.  She is under a controlled substance contract already.  She says she was taking hydrocodone 2-3 times a day but usually just twice a day.  She is aware of the potential interaction that could cause overdose.  Depression screen [facility] 2/9 03/27/2020 03/11/2020 02/15/2020  Decreased Interest 0 0 0  Down, Depressed, Hopeless 0 0 0  PHQ - 2 Score 0 0 0  Altered sleeping - 3 -  Tired, decreased energy - 3 -  Change in appetite - 0 -  Feeling bad or failure about yourself  - 0 -  Trouble concentrating - 0 -  Moving slowly or fidgety/restless - 0 -  Suicidal thoughts - 0 -  PHQ-9 Score - 6 -  Difficult doing work/chores - Somewhat difficult -  Some recent data might be hidden    History Kara Nunez has a past medical history of Allergic rhinitis, Ascending aortic aneurysm (HCC), Chronic back pain, CKD (chronic kidney disease) stage 3, GFR 30-59 ml/min, Coronary atherosclerosis of native coronary artery, Degenerative disc disease, Essential hypertension, Hyperlipidemia, Insomnia, and Type 2 diabetes mellitus (Kara Nunez).   She has a past surgical history that includes Cholecystectomy and Tubal ligation.   Her family history includes Allergies in her maternal grandmother and mother; Asthma in her mother; Breast cancer in her maternal grandmother; Heart disease in her maternal grandfather, maternal grandmother, mother, paternal grandfather, and paternal grandmother.She reports that she has never smoked. She has never used smokeless tobacco. She reports previous alcohol use. She reports that she does not use drugs.    ROS Review of Systems  Constitutional: Negative.   HENT: Negative.   Eyes: Negative for visual disturbance.  Respiratory: Negative for  shortness of breath.   Cardiovascular: Negative for chest pain.  Gastrointestinal: Negative for abdominal pain.  Musculoskeletal: Negative for arthralgias.    Objective:  BP 132/78   Pulse 81   Temp 98.1 F (36.7 C) (Temporal)   Resp 20   Ht 5' 3.75" (1.619 m)   Wt 215 lb (97.5 kg)   SpO2 95%   BMI 37.19 kg/m   BP Readings from Last 3 Encounters:  03/27/20 132/78  03/11/20 138/86  02/15/20 (!) 149/75    Wt Readings from Last 3 Encounters:  03/27/20 215 lb (97.5 kg)  03/11/20 214 lb 3.2 oz (97.2 kg)  02/15/20 209 lb (94.8 kg)     Physical Exam Constitutional:      General: She is not in acute distress.    Appearance: She is well-developed.  Cardiovascular:     Rate and Rhythm: Normal rate and regular rhythm.  Pulmonary:     Breath sounds: Normal breath sounds.  Skin:    General: Skin is warm and dry.  Neurological:     Mental Status: She is alert and oriented to person, place, and time.       Assessment & Plan:   Diagnoses and all orders for this visit:  Insomnia due to other mental disorder  Other orders -     zolpidem (AMBIEN) 10 MG tablet; Take 1 tablet (10 mg total) by mouth at bedtime as needed.       I am having Kara J.  Nunez maintain her aspirin EC, clobetasol cream, triamcinolone cream, HYDROcodone-acetaminophen, albuterol, carvedilol, cloNIDine, donepezil, escitalopram, Bydureon BCise, furosemide, iron polysaccharides, levothyroxine, memantine, NIFEdipine, nitroGLYCERIN, OLANZapine, pantoprazole, potassium chloride, rosuvastatin, sitaGLIPtin, spironolactone, Vitamin D (Ergocalciferol), and zolpidem.  Allergies as of 03/27/2020      Reactions   Losartan Other (See Comments)   Her renal function will not tolerate ACEi/ARB. Please contact nephrology prior to re-challenge.    Codeine Nausea Only   Amoxicillin Rash      Medication List       Accurate as of March 27, 2020 11:59 PM. If you have any questions, ask your nurse or doctor.          albuterol 108 (90 Base) MCG/ACT inhaler Commonly known as: Ventolin HFA USE 2 PUFFS EVERY 6 HOURS AS NEEDED   aspirin EC 81 MG tablet Take 81 mg by mouth daily.   Bydureon BCise 2 MG/0.85ML Auij Generic drug: Exenatide ER Inject 2 mg into the skin once a week.   carvedilol 25 MG tablet Commonly known as: COREG Take 1 tablet (25 mg total) by mouth 2 (two) times daily with a meal.   clobetasol cream 0.05 % Commonly known as: TEMOVATE Apply 1 application topically 2 (two) times daily.   cloNIDine 0.2 MG tablet Commonly known as: CATAPRES TAKE ONE (1) TABLET THREE (3) TIMES EACH DAY   donepezil 10 MG tablet Commonly known as: ARICEPT TAKE 1 TABLET BY MOUTH EVERYDAY AT BEDTIME   escitalopram 10 MG tablet Commonly known as: LEXAPRO Take 1 tablet (10 mg total) by mouth daily.   furosemide 20 MG tablet Commonly known as: LASIX Take 1 tablet (20 mg total) by mouth daily.   HYDROcodone-acetaminophen 7.5-325 MG tablet Commonly known as: Norco Take 1 tablet by mouth every 8 (eight) hours as needed for severe pain.   iron polysaccharides 150 MG capsule Commonly known as: IFerex 150 TAKE ONE (1) CAPSULE EACH DAY   levothyroxine 50 MCG tablet Commonly known as: SYNTHROID TAKE ONE TABLET EACH MORNING BEFORE BREAKFAST   memantine 28 MG Cp24 24 hr capsule Commonly known as: NAMENDA XR TAKE ONE (1) CAPSULE EACH DAY   NIFEdipine 90 MG 24 hr tablet Commonly known as: PROCARDIA XL/NIFEDICAL-XL Take 1 tablet (90 mg total) by mouth daily.   nitroGLYCERIN 0.1 mg/hr patch Commonly known as: NITRODUR - Dosed in mg/24 hr Place 1 patch (0.1 mg total) onto the skin daily.   OLANZapine 5 MG tablet Commonly known as: ZYPREXA Take 1 tablet (5 mg total) by mouth at bedtime.   pantoprazole 40 MG tablet Commonly known as: PROTONIX Take 1 tablet (40 mg total) by mouth 2 (two) times daily.   potassium chloride 10 MEQ tablet Commonly known as: KLOR-CON Take 1 tablet (10 mEq total)  by mouth 2 (two) times daily.   rosuvastatin 20 MG tablet Commonly known as: CRESTOR Take 1 tablet (20 mg total) by mouth daily.   sitaGLIPtin 100 MG tablet Commonly known as: Januvia TAKE ONE (1) TABLET EACH DAY   spironolactone 25 MG tablet Commonly known as: ALDACTONE Take 1 tablet (25 mg total) by mouth daily.   triamcinolone cream 0.1 % Commonly known as: KENALOG   Vitamin D (Ergocalciferol) 1.25 MG (50000 UNIT) Caps capsule Commonly known as: DRISDOL TAKE 1 CAPSULE TWICE A WEEK   zolpidem 10 MG tablet Commonly known as: AMBIEN Take 1 tablet (10 mg total) by mouth at bedtime as needed.        Follow-up: Return in about 3 months (  around 06/27/2020).  Claretta Fraise, M.D.

## 2020-04-04 ENCOUNTER — Other Ambulatory Visit: Payer: Self-pay | Admitting: Family Medicine

## 2020-04-04 DIAGNOSIS — R600 Localized edema: Secondary | ICD-10-CM

## 2020-04-04 DIAGNOSIS — R413 Other amnesia: Secondary | ICD-10-CM

## 2020-04-04 DIAGNOSIS — I10 Essential (primary) hypertension: Secondary | ICD-10-CM

## 2020-04-04 DIAGNOSIS — F339 Major depressive disorder, recurrent, unspecified: Secondary | ICD-10-CM

## 2020-04-04 DIAGNOSIS — F419 Anxiety disorder, unspecified: Secondary | ICD-10-CM

## 2020-04-07 DIAGNOSIS — I129 Hypertensive chronic kidney disease with stage 1 through stage 4 chronic kidney disease, or unspecified chronic kidney disease: Secondary | ICD-10-CM | POA: Diagnosis not present

## 2020-04-07 DIAGNOSIS — I10 Essential (primary) hypertension: Secondary | ICD-10-CM | POA: Diagnosis not present

## 2020-04-07 DIAGNOSIS — E1122 Type 2 diabetes mellitus with diabetic chronic kidney disease: Secondary | ICD-10-CM | POA: Diagnosis not present

## 2020-04-07 DIAGNOSIS — N1832 Chronic kidney disease, stage 3b: Secondary | ICD-10-CM | POA: Diagnosis not present

## 2020-04-07 DIAGNOSIS — N184 Chronic kidney disease, stage 4 (severe): Secondary | ICD-10-CM | POA: Diagnosis not present

## 2020-04-07 DIAGNOSIS — E785 Hyperlipidemia, unspecified: Secondary | ICD-10-CM | POA: Diagnosis not present

## 2020-04-09 ENCOUNTER — Encounter: Payer: Self-pay | Admitting: Family Medicine

## 2020-04-09 ENCOUNTER — Other Ambulatory Visit: Payer: Self-pay

## 2020-04-09 ENCOUNTER — Ambulatory Visit (INDEPENDENT_AMBULATORY_CARE_PROVIDER_SITE_OTHER): Payer: Medicare Other | Admitting: Family Medicine

## 2020-04-09 DIAGNOSIS — G8929 Other chronic pain: Secondary | ICD-10-CM | POA: Diagnosis not present

## 2020-04-09 DIAGNOSIS — M546 Pain in thoracic spine: Secondary | ICD-10-CM | POA: Diagnosis not present

## 2020-04-09 DIAGNOSIS — M545 Low back pain, unspecified: Secondary | ICD-10-CM

## 2020-04-09 MED ORDER — HYDROCODONE-ACETAMINOPHEN 7.5-325 MG PO TABS: 1.0000 | ORAL_TABLET | Freq: Three times a day (TID) | ORAL | 0 refills | Status: DC | PRN

## 2020-04-09 NOTE — Progress Notes (Signed)
Subjective:  Patient ID: Kara Nunez, female    DOB: 1951-07-01  Age: 69 y.o. MRN: [id number]  CC: Follow-up (1 month)   HPI LEYAH BOCCHINO presents for follow-up on her chronic right-sided back pain in the thoracic region.  This is unchanged from previous visits.  She also suffers from chronic lumbar pain which is also unchanged stable.  She has moderately severe pain.  This is reduced significantly by the use of hydrocodone.  She is in for refill today.  Her PDMP score shows that she has a 460 NARx score for narcotics and a 401_4 sedatives.  Her overdose risk score is 350.  Her morphine milligram equivalent is 22.5.  Her today.  Controlled substance contract and urine drug screen were reviewed and are  Depression screen [facility] 2/9 04/09/2020 03/27/2020 03/11/2020  Decreased Interest 0 0 0  Down, Depressed, Hopeless 0 0 0  PHQ - 2 Score 0 0 0  Altered sleeping 2 - 3  Tired, decreased energy 2 - 3  Change in appetite 0 - 0  Feeling bad or failure about yourself  0 - 0  Trouble concentrating 0 - 0  Moving slowly or fidgety/restless 0 - 0  Suicidal thoughts 0 - 0  PHQ-9 Score 4 - 6  Difficult doing work/chores Somewhat difficult - Somewhat difficult  Some recent data might be hidden    History Garrie has a past medical history of Allergic rhinitis, Ascending aortic aneurysm (HCC), Chronic back pain, CKD (chronic kidney disease) stage 3, GFR 30-59 ml/min, Coronary atherosclerosis of native coronary artery, Degenerative disc disease, Essential hypertension, Hyperlipidemia, Insomnia, and Type 2 diabetes mellitus ([place]).   She has a past surgical history that includes Cholecystectomy and Tubal ligation.   Her family history includes Allergies in her maternal grandmother and mother; Asthma in her mother; Breast cancer in her maternal grandmother; Heart disease in her maternal grandfather, maternal grandmother, mother, paternal grandfather, and paternal grandmother.She reports that she has never  smoked. She has never used smokeless tobacco. She reports previous alcohol use. She reports that she does not use drugs.    ROS Review of Systems  Constitutional: Negative.   HENT: Negative.   Eyes: Negative for visual disturbance.  Respiratory: Negative for shortness of breath.   Cardiovascular: Negative for chest pain.  Gastrointestinal: Negative for abdominal pain.  Musculoskeletal: Negative for arthralgias.    Objective:  BP (!) 130/69   Pulse 77   Temp 98 F (36.7 C) (Temporal)   Ht 5' 3.75" (1.619 m)   Wt (!) 212 lb 12.8 oz (96.5 kg)   BMI 36.81 kg/m   BP Readings from Last 3 Encounters:  04/09/20 (!) 130/69  03/27/20 132/78  03/11/20 138/86    Wt Readings from Last 3 Encounters:  04/09/20 (!) 212 lb 12.8 oz (96.5 kg)  03/27/20 215 lb (97.5 kg)  03/11/20 214 lb 3.2 oz (97.2 kg)     Physical Exam Constitutional:      General: She is not in acute distress.    Appearance: She is well-developed.  Cardiovascular:     Rate and Rhythm: Normal rate and regular rhythm.  Pulmonary:     Breath sounds: Normal breath sounds.  Skin:    General: Skin is warm and dry.  Neurological:     Mental Status: She is alert and oriented to person, place, and time.       Assessment & Plan:   Lunette was seen today for follow-up.  Diagnoses and all  orders for this visit:  Chronic right-sided thoracic back pain -     HYDROcodone-acetaminophen (NORCO) 7.5-325 MG tablet; Take 1 tablet by mouth every 8 (eight) hours as needed for severe pain.  Lumbar pain -     HYDROcodone-acetaminophen (NORCO) 7.5-325 MG tablet; Take 1 tablet by mouth every 8 (eight) hours as needed for severe pain.       I am having Weltha J. Dominik maintain her aspirin EC, clobetasol cream, triamcinolone cream, albuterol, carvedilol, cloNIDine, Bydureon BCise, iron polysaccharides, levothyroxine, memantine, NIFEdipine, nitroGLYCERIN, OLANZapine, potassium chloride, rosuvastatin, sitaGLIPtin,  spironolactone, Vitamin D (Ergocalciferol), zolpidem, pantoprazole, donepezil, escitalopram, furosemide, and HYDROcodone-acetaminophen.  Allergies as of 04/09/2020      Reactions   Losartan Other (See Comments)   Her renal function will not tolerate ACEi/ARB. Please contact nephrology prior to re-challenge.    Codeine Nausea Only   Amoxicillin Rash      Medication List       Accurate as of April 09, 2020 11:59 PM. If you have any questions, ask your nurse or doctor.        albuterol 108 (90 Base) MCG/ACT inhaler Commonly known as: Ventolin HFA USE 2 PUFFS EVERY 6 HOURS AS NEEDED   aspirin EC 81 MG tablet Take 81 mg by mouth daily.   Bydureon BCise 2 MG/0.85ML Auij Generic drug: Exenatide ER Inject 2 mg into the skin once a week.   carvedilol 25 MG tablet Commonly known as: COREG Take 1 tablet (25 mg total) by mouth 2 (two) times daily with a meal.   clobetasol cream 0.05 % Commonly known as: TEMOVATE Apply 1 application topically 2 (two) times daily.   cloNIDine 0.2 MG tablet Commonly known as: CATAPRES TAKE ONE (1) TABLET THREE (3) TIMES EACH DAY   donepezil 10 MG tablet Commonly known as: ARICEPT TAKE 1 TABLET BY MOUTH EVERYDAY AT BEDTIME   escitalopram 10 MG tablet Commonly known as: LEXAPRO TAKE 1 TABLET BY MOUTH EVERY DAY   furosemide 20 MG tablet Commonly known as: LASIX TAKE 1 TABLET BY MOUTH EVERY DAY   HYDROcodone-acetaminophen 7.5-325 MG tablet Commonly known as: Norco Take 1 tablet by mouth every 8 (eight) hours as needed for severe pain.   iron polysaccharides 150 MG capsule Commonly known as: IFerex 150 TAKE ONE (1) CAPSULE EACH DAY   levothyroxine 50 MCG tablet Commonly known as: SYNTHROID TAKE ONE TABLET EACH MORNING BEFORE BREAKFAST   memantine 28 MG Cp24 24 hr capsule Commonly known as: NAMENDA XR TAKE ONE (1) CAPSULE EACH DAY   NIFEdipine 90 MG 24 hr tablet Commonly known as: PROCARDIA XL/NIFEDICAL-XL Take 1 tablet (90 mg total) by  mouth daily.   nitroGLYCERIN 0.1 mg/hr patch Commonly known as: NITRODUR - Dosed in mg/24 hr Place 1 patch (0.1 mg total) onto the skin daily.   OLANZapine 5 MG tablet Commonly known as: ZYPREXA Take 1 tablet (5 mg total) by mouth at bedtime.   pantoprazole 40 MG tablet Commonly known as: PROTONIX TAKE 1 TABLET BY MOUTH TWICE A DAY   potassium chloride 10 MEQ tablet Commonly known as: KLOR-CON Take 1 tablet (10 mEq total) by mouth 2 (two) times daily.   rosuvastatin 20 MG tablet Commonly known as: CRESTOR Take 1 tablet (20 mg total) by mouth daily.   sitaGLIPtin 100 MG tablet Commonly known as: Januvia TAKE ONE (1) TABLET EACH DAY   spironolactone 25 MG tablet Commonly known as: ALDACTONE Take 1 tablet (25 mg total) by mouth daily.   triamcinolone  cream 0.1 % Commonly known as: KENALOG   Vitamin D (Ergocalciferol) 1.25 MG (50000 UNIT) Caps capsule Commonly known as: DRISDOL TAKE 1 CAPSULE TWICE A WEEK   zolpidem 10 MG tablet Commonly known as: AMBIEN Take 1 tablet (10 mg total) by mouth at bedtime as needed.        Follow-up: Return in about 1 month (around 05/10/2020).  Claretta Fraise, M.D.

## 2020-04-10 ENCOUNTER — Encounter: Payer: Self-pay | Admitting: Family Medicine

## 2020-04-28 ENCOUNTER — Ambulatory Visit (INDEPENDENT_AMBULATORY_CARE_PROVIDER_SITE_OTHER): Payer: Medicare Other | Admitting: Family Medicine

## 2020-04-28 ENCOUNTER — Telehealth: Payer: Self-pay | Admitting: Family Medicine

## 2020-04-28 ENCOUNTER — Encounter: Payer: Self-pay | Admitting: Family Medicine

## 2020-04-28 ENCOUNTER — Other Ambulatory Visit: Payer: Self-pay

## 2020-04-28 VITALS — BP 126/74 | HR 78 | Temp 97.3°F | Resp 20 | Ht 63.75 in | Wt 216.4 lb

## 2020-04-28 DIAGNOSIS — F5101 Primary insomnia: Secondary | ICD-10-CM | POA: Diagnosis not present

## 2020-04-28 DIAGNOSIS — G8929 Other chronic pain: Secondary | ICD-10-CM

## 2020-04-28 DIAGNOSIS — M545 Low back pain, unspecified: Secondary | ICD-10-CM

## 2020-04-28 DIAGNOSIS — M546 Pain in thoracic spine: Secondary | ICD-10-CM

## 2020-04-28 MED ORDER — ZOLPIDEM TARTRATE 10 MG PO TABS: 10.0000 mg | ORAL_TABLET | Freq: Every evening | ORAL | 5 refills | Status: AC | PRN

## 2020-04-28 MED ORDER — HYDROCODONE-ACETAMINOPHEN 7.5-325 MG PO TABS: 1.0000 | ORAL_TABLET | Freq: Three times a day (TID) | ORAL | 0 refills | Status: DC | PRN

## 2020-04-28 NOTE — Progress Notes (Signed)
Subjective:  Patient ID: Kara Nunez, female    DOB: Jul 18, 1951  Age: 69 y.o. MRN: [id number]  CC: Medication Refill   HPI Kara Nunez presents for follow-up on her chronic anxiety.  PHQ-9 score noted below.  Recent GAD-7 score unchanged see below.  She continues to take olanzapine as well as Lexapro.  She is not sleeping well.  Her pain is under good control.  PDMP score reviewed.  She last filled her hydrocodone on 728 for a month supply with a morphine milligram equivalent of 22.5.  Her last fill of the zolpidem was 03/27/2020 for a 30-day supply with 0.5 lorazepam milligram equivalent score noted per day.  Today she is in need of a refill on the zolpidem.  Apparently she was able to get the zolpidem at the drugstore locally last month but was told that she would have to transfer the prescription to a place closer to home.  Apparently she misunderstood this information and asked to that today's be sent to the drugstore as well.    Unfortunately this information came in after the office visit.  She at the time of the office visit stated she just needs a refill sent to the [facility] Store in [place].  Communication with the pharmacist there is that she will need to have it sent closer to her home in [place].  [facility] [place] [facility].  The refills on the previous prescription sent to [facility] are voided and a new prescription is forwarded to [facility].  Patient denies any known history of sleepwalking or sleep driving.  She is divorced but does spend some time in the home with her ex-husband and he has not commented to her about any irregularities either.  Her official address is in [place] [facility] [place] but she splits her time frequently with her husband at his home locally.  She reports that her sleep quality is poor in both onset and duration with early and frequent awakenings.  This is nearly completely resolved with the use of the current dose of  Ambien.  Depression screen [facility] 2/9 04/28/2020 04/09/2020 03/27/2020  Decreased Interest 0 0 0  Down, Depressed, Hopeless 0 0 0  PHQ - 2 Score 0 0 0  Altered sleeping - 2 -  Tired, decreased energy - 2 -  Change in appetite - 0 -  Feeling bad or failure about yourself  - 0 -  Trouble concentrating - 0 -  Moving slowly or fidgety/restless - 0 -  Suicidal thoughts - 0 -  PHQ-9 Score - 4 -  Difficult doing work/chores - Somewhat difficult -  Some recent data might be hidden   GAD 7 : Generalized Anxiety Score 04/09/2020 03/11/2020 04/06/2019  Nervous, Anxious, on Edge 0 0 2  Control/stop worrying 0 0 3  Worry too much - different things 0 0 3  Trouble relaxing 1 2 0  Restless 0 0 0  Easily annoyed or irritable 0 0 0  Afraid - awful might happen 0 0 1  Total GAD 7 Score 1 2 9   Anxiety Difficulty Somewhat difficult Somewhat difficult -      History Kara Nunez has a past medical history of Allergic rhinitis, Ascending aortic aneurysm (HCC), Chronic back pain, CKD (chronic kidney disease) stage 3, GFR 30-59 ml/min, Coronary atherosclerosis of native coronary artery, Degenerative disc disease, Essential hypertension, Hyperlipidemia, Insomnia, and Type 2 diabetes mellitus ([place]).   She has a past surgical history that  includes Cholecystectomy and Tubal ligation.   Her family history includes Allergies in her maternal grandmother and mother; Asthma in her mother; Breast cancer in her maternal grandmother; Heart disease in her maternal grandfather, maternal grandmother, mother, paternal grandfather, and paternal grandmother.She reports that she has never smoked. She has never used smokeless tobacco. She reports previous alcohol use. She reports that she does not use drugs.    ROS Review of Systems  Constitutional: Negative.   HENT: Negative.   Eyes: Negative for visual disturbance.  Respiratory: Negative for shortness of breath.   Cardiovascular: Negative for chest pain.  Gastrointestinal:  Negative for abdominal pain.  Musculoskeletal: Negative for arthralgias.  Psychiatric/Behavioral: Positive for sleep disturbance.    Objective:  BP 126/74   Pulse 78   Temp (!) 97.3 F (36.3 C) (Temporal)   Resp 20   Ht 5' 3.75" (1.619 m)   Wt 216 lb 6 oz (98.1 kg)   SpO2 99%   BMI 37.43 kg/m   BP Readings from Last 3 Encounters:  04/28/20 126/74  04/09/20 (!) 130/69  03/27/20 132/78    Wt Readings from Last 3 Encounters:  04/28/20 216 lb 6 oz (98.1 kg)  04/09/20 (!) 212 lb 12.8 oz (96.5 kg)  03/27/20 215 lb (97.5 kg)     Physical Exam Constitutional:      General: She is not in acute distress.    Appearance: She is well-developed.  HENT:     Head: Normocephalic and atraumatic.  Eyes:     Conjunctiva/sclera: Conjunctivae normal.     Pupils: Pupils are equal, round, and reactive to light.  Neck:     Thyroid: No thyromegaly.  Cardiovascular:     Rate and Rhythm: Normal rate and regular rhythm.     Heart sounds: Normal heart sounds. No murmur heard.   Pulmonary:     Effort: Pulmonary effort is normal. No respiratory distress.     Breath sounds: Normal breath sounds. No wheezing or rales.  Abdominal:     General: Bowel sounds are normal. There is no distension.     Palpations: Abdomen is soft.     Tenderness: There is no abdominal tenderness.  Musculoskeletal:        General: Normal range of motion.     Cervical back: Normal range of motion and neck supple.  Lymphadenopathy:     Cervical: No cervical adenopathy.  Skin:    General: Skin is warm and dry.  Neurological:     Mental Status: She is alert and oriented to person, place, and time.  Psychiatric:        Behavior: Behavior normal.        Thought Content: Thought content normal.        Judgment: Judgment normal.       Assessment & Plan:   Kara Nunez was seen today for medication refill.  Diagnoses and all orders for this visit:  Primary insomnia -     ToxASSURE Select 13 (MW), Urine  Chronic  right-sided thoracic back pain -     HYDROcodone-acetaminophen (NORCO) 7.5-325 MG tablet; Take 1 tablet by mouth every 8 (eight) hours as needed for severe pain. -     HYDROcodone-acetaminophen (NORCO) 7.5-325 MG tablet; Take 1 tablet by mouth every 8 (eight) hours as needed for severe pain. -     HYDROcodone-acetaminophen (NORCO) 7.5-325 MG tablet; Take 1 tablet by mouth every 8 (eight) hours as needed for severe pain. -     ToxASSURE Select 13 (MW),  Urine  Lumbar pain -     HYDROcodone-acetaminophen (NORCO) 7.5-325 MG tablet; Take 1 tablet by mouth every 8 (eight) hours as needed for severe pain. -     HYDROcodone-acetaminophen (NORCO) 7.5-325 MG tablet; Take 1 tablet by mouth every 8 (eight) hours as needed for severe pain. -     HYDROcodone-acetaminophen (NORCO) 7.5-325 MG tablet; Take 1 tablet by mouth every 8 (eight) hours as needed for severe pain. -     ToxASSURE Select 13 (MW), Urine  Other orders -     zolpidem (AMBIEN) 10 MG tablet; Take 1 tablet (10 mg total) by mouth at bedtime as needed.       I am having Kara Nunez start on HYDROcodone-acetaminophen and HYDROcodone-acetaminophen. I am also having her maintain her aspirin EC, clobetasol cream, triamcinolone cream, albuterol, carvedilol, cloNIDine, Bydureon BCise, iron polysaccharides, levothyroxine, memantine, NIFEdipine, nitroGLYCERIN, OLANZapine, potassium chloride, rosuvastatin, sitaGLIPtin, spironolactone, Vitamin D (Ergocalciferol), pantoprazole, donepezil, escitalopram, furosemide, HYDROcodone-acetaminophen, and zolpidem.  Allergies as of 04/28/2020      Reactions   Losartan Other (See Comments)   Her renal function will not tolerate ACEi/ARB. Please contact nephrology prior to re-challenge.    Codeine Nausea Only   Amoxicillin Rash      Medication List       Accurate as of April 28, 2020 10:49 PM. If you have any questions, ask your nurse or doctor.        albuterol 108 (90 Base) MCG/ACT  inhaler Commonly known as: Ventolin HFA USE 2 PUFFS EVERY 6 HOURS AS NEEDED   aspirin EC 81 MG tablet Take 81 mg by mouth daily.   Bydureon BCise 2 MG/0.85ML Auij Generic drug: Exenatide ER Inject 2 mg into the skin once a week.   carvedilol 25 MG tablet Commonly known as: COREG Take 1 tablet (25 mg total) by mouth 2 (two) times daily with a meal.   clobetasol cream 0.05 % Commonly known as: TEMOVATE Apply 1 application topically 2 (two) times daily.   cloNIDine 0.2 MG tablet Commonly known as: CATAPRES TAKE ONE (1) TABLET THREE (3) TIMES EACH DAY   donepezil 10 MG tablet Commonly known as: ARICEPT TAKE 1 TABLET BY MOUTH EVERYDAY AT BEDTIME   escitalopram 10 MG tablet Commonly known as: LEXAPRO TAKE 1 TABLET BY MOUTH EVERY DAY   furosemide 20 MG tablet Commonly known as: LASIX TAKE 1 TABLET BY MOUTH EVERY DAY   HYDROcodone-acetaminophen 7.5-325 MG tablet Commonly known as: Norco Take 1 tablet by mouth every 8 (eight) hours as needed for severe pain. What changed: Another medication with the same name was added. Make sure you understand how and when to take each. Changed by: Claretta Fraise, MD   HYDROcodone-acetaminophen 7.5-325 MG tablet Commonly known as: Norco Take 1 tablet by mouth every 8 (eight) hours as needed for severe pain. Start taking on: June 08, 2020 What changed: You were already taking a medication with the same name, and this prescription was added. Make sure you understand how and when to take each. Changed by: Claretta Fraise, MD   HYDROcodone-acetaminophen 7.5-325 MG tablet Commonly known as: Norco Take 1 tablet by mouth every 8 (eight) hours as needed for severe pain. Start taking on: July 08, 2020 What changed: You were already taking a medication with the same name, and this prescription was added. Make sure you understand how and when to take each. Changed by: Claretta Fraise, MD   iron polysaccharides 150 MG capsule Commonly known as:  IFerex  150 TAKE ONE (1) CAPSULE EACH DAY   levothyroxine 50 MCG tablet Commonly known as: SYNTHROID TAKE ONE TABLET EACH MORNING BEFORE BREAKFAST   memantine 28 MG Cp24 24 hr capsule Commonly known as: NAMENDA XR TAKE ONE (1) CAPSULE EACH DAY   NIFEdipine 90 MG 24 hr tablet Commonly known as: PROCARDIA XL/NIFEDICAL-XL Take 1 tablet (90 mg total) by mouth daily.   nitroGLYCERIN 0.1 mg/hr patch Commonly known as: NITRODUR - Dosed in mg/24 hr Place 1 patch (0.1 mg total) onto the skin daily.   OLANZapine 5 MG tablet Commonly known as: ZYPREXA Take 1 tablet (5 mg total) by mouth at bedtime.   pantoprazole 40 MG tablet Commonly known as: PROTONIX TAKE 1 TABLET BY MOUTH TWICE A DAY   potassium chloride 10 MEQ tablet Commonly known as: KLOR-CON Take 1 tablet (10 mEq total) by mouth 2 (two) times daily.   rosuvastatin 20 MG tablet Commonly known as: CRESTOR Take 1 tablet (20 mg total) by mouth daily.   sitaGLIPtin 100 MG tablet Commonly known as: Januvia TAKE ONE (1) TABLET EACH DAY   spironolactone 25 MG tablet Commonly known as: ALDACTONE Take 1 tablet (25 mg total) by mouth daily.   triamcinolone cream 0.1 % Commonly known as: KENALOG   Vitamin D (Ergocalciferol) 1.25 MG (50000 UNIT) Caps capsule Commonly known as: DRISDOL TAKE 1 CAPSULE TWICE A WEEK   zolpidem 10 MG tablet Commonly known as: AMBIEN Take 1 tablet (10 mg total) by mouth at bedtime as needed.        Follow-up: Return in about 3 months (around 07/29/2020).  Claretta Fraise, M.D.

## 2020-04-28 NOTE — Telephone Encounter (Signed)
I sent it to the drug store last month and had no feedback. Please check with the Drug Store to confirm. Then cancel her zolpidem refill there. Then I can send to the other pharmacy.

## 2020-04-28 NOTE — Telephone Encounter (Signed)
Spoke with Aaron Edelman and [facility] [place], [place].  Pharmacist states that patient last picked up Ambien Rx 03/27/2020 [facility] and was informed that she would need to get her prescriptions filled at a pharmacy close by,  [facility] [place]

## 2020-04-28 NOTE — Telephone Encounter (Signed)
Pt says that pharmacist at the Drug store told pt that she needs to have zolpidem (AMBIEN) 10 MG tablet [facility] [place] due to [facility]. Please send in. Pt had an apt today with [facility]

## 2020-04-29 NOTE — Telephone Encounter (Signed)
Patient aware and hydrocodone will need to be switched as well

## 2020-04-29 NOTE — Telephone Encounter (Signed)
I sent the scrip to [facility] [place] for the [place]. Do we need to move the hydrocodone also?

## 2020-04-30 ENCOUNTER — Telehealth: Payer: Self-pay | Admitting: Family Medicine

## 2020-04-30 LAB — TOXASSURE SELECT 13 (MW), URINE

## 2020-04-30 NOTE — Telephone Encounter (Unsigned)
  Prescription Request  04/30/2020  What is the name of the medication or equipment?   Have you contacted your pharmacy to request a refill? (if applicable) ***  Which pharmacy would you like this sent to? ***   Patient notified that their request is being sent to the clinical staff for review and that they should receive a response within 2 business days.

## 2020-04-30 NOTE — Telephone Encounter (Signed)
Rx done 8-16

## 2020-05-06 ENCOUNTER — Other Ambulatory Visit: Payer: Self-pay | Admitting: Family Medicine

## 2020-05-06 DIAGNOSIS — G8929 Other chronic pain: Secondary | ICD-10-CM

## 2020-05-06 DIAGNOSIS — M545 Low back pain, unspecified: Secondary | ICD-10-CM

## 2020-05-06 MED ORDER — HYDROCODONE-ACETAMINOPHEN 7.5-325 MG PO TABS: 1.0000 | ORAL_TABLET | Freq: Three times a day (TID) | ORAL | 0 refills | Status: AC | PRN

## 2020-05-13 ENCOUNTER — Telehealth: Payer: Self-pay | Admitting: Family Medicine

## 2020-05-14 ENCOUNTER — Ambulatory Visit: Payer: Medicare Other | Admitting: Family Medicine

## 2020-05-26 ENCOUNTER — Ambulatory Visit (INDEPENDENT_AMBULATORY_CARE_PROVIDER_SITE_OTHER): Payer: Medicare Other | Admitting: Family Medicine

## 2020-05-26 ENCOUNTER — Encounter: Payer: Self-pay | Admitting: Family Medicine

## 2020-05-26 ENCOUNTER — Other Ambulatory Visit: Payer: Self-pay

## 2020-05-26 VITALS — BP 122/73 | HR 66 | Temp 97.6°F | Ht 63.75 in | Wt 215.2 lb

## 2020-05-26 DIAGNOSIS — M5136 Other intervertebral disc degeneration, lumbar region: Secondary | ICD-10-CM

## 2020-05-26 DIAGNOSIS — F5101 Primary insomnia: Secondary | ICD-10-CM | POA: Diagnosis not present

## 2020-05-26 NOTE — Progress Notes (Signed)
Subjective:  Patient ID: Kara Nunez, female    DOB: 1951/08/25  Age: 69 y.o. MRN: [id number]  CC: Follow-up   HPI Kara Nunez presents for follow-up of her chronic pain.  It is moderate it is primarily in her lower back and radiates to both lower extremities down the lateral thighs past the knees.  She ran out of of her medicine a few days ago but she had been stretching it prior to bedtime.  She did not get a notification from [facility] that [facility] medicines have been called in for her based on her appointment here last month.  Her local pharmacist asked her to have her prescriptions filled closer to her home in [place].  As a result [facility] [place] [facility] the [facility] store in [place].  She has not picked them up since July 28.  She was in on August 16 for refills of her zolpidem.  At that time her pharmacist at the drugstore asked that she fill the zolpidem closer to her official residence.  As result that prescription for zolpidem was changed to [facility] [place].  Subsequently the hydrocodone was refused as well by the drugstore.  It also was transferred [facility] [place].  PDMP review performed today shows that she has not filled the recent prescriptions for hydrocodone.  Her rating for narcotics through PDMP is 460.  For sedative it is 411.  4 stimulants it is 0.  Her overall overdose score is 340.  There are no discrepancies noted in her filling record.  She says her pain has been going fairly well recently in spite of not having her medication.  History Kara Nunez has a past medical history of Allergic rhinitis, Ascending aortic aneurysm (HCC), Chronic back pain, CKD (chronic kidney disease) stage 3, GFR 30-59 ml/min, Coronary atherosclerosis of native coronary artery, Degenerative disc disease, Essential hypertension, Hyperlipidemia, Insomnia, and Type 2 diabetes mellitus ([place]).   She has a past surgical history that includes Cholecystectomy and Tubal  ligation.   Her family history includes Allergies in her maternal grandmother and mother; Asthma in her mother; Breast cancer in her maternal grandmother; Heart disease in her maternal grandfather, maternal grandmother, mother, paternal grandfather, and paternal grandmother.She reports that she has never smoked. She has never used smokeless tobacco. She reports previous alcohol use. She reports that she does not use drugs.    ROS Review of Systems  Constitutional: Negative for activity change.  HENT: Negative.   Respiratory: Negative for shortness of breath.   Cardiovascular: Negative for chest pain.  Musculoskeletal: Positive for back pain and myalgias.    Objective:  BP 122/73    Pulse 66    Temp 97.6 F (36.4 C) (Temporal)    Ht 5' 3.75" (1.619 m)    Wt 215 lb 3.2 oz (97.6 kg)    BMI 37.23 kg/m   BP Readings from Last 3 Encounters:  05/26/20 122/73  04/28/20 126/74  04/09/20 (!) 130/69    Wt Readings from Last 3 Encounters:  05/26/20 215 lb 3.2 oz (97.6 kg)  04/28/20 216 lb 6 oz (98.1 kg)  04/09/20 (!) 212 lb 12.8 oz (96.5 kg)     Physical Exam Constitutional:      General: She is not in acute distress.    Appearance: She is well-developed.  Cardiovascular:     Rate and Rhythm: Normal rate and regular rhythm.  Pulmonary:     Breath sounds: Normal breath sounds.  Skin:  General: Skin is warm and dry.  Neurological:     Mental Status: She is alert and oriented to person, place, and time.       Assessment & Plan:   Kara Nunez was seen today for follow-up.  Diagnoses and all orders for this visit:  DDD (degenerative disc disease), lumbar  Primary insomnia       I am having Kara Nunez maintain her aspirin EC, clobetasol cream, triamcinolone cream, albuterol, carvedilol, cloNIDine, Bydureon BCise, iron polysaccharides, levothyroxine, memantine, NIFEdipine, nitroGLYCERIN, OLANZapine, potassium chloride, rosuvastatin, sitaGLIPtin, spironolactone, Vitamin  D (Ergocalciferol), pantoprazole, donepezil, escitalopram, furosemide, zolpidem, HYDROcodone-acetaminophen, HYDROcodone-acetaminophen, and HYDROcodone-acetaminophen.  Allergies as of 05/26/2020      Reactions   Losartan Other (See Comments)   Her renal function will not tolerate ACEi/ARB. Please contact nephrology prior to re-challenge.    Codeine Nausea Only   Amoxicillin Rash      Medication List       Accurate as of May 26, 2020  3:37 PM. If you have any questions, ask your nurse or doctor.        albuterol 108 (90 Base) MCG/ACT inhaler Commonly known as: Ventolin HFA USE 2 PUFFS EVERY 6 HOURS AS NEEDED   aspirin EC 81 MG tablet Take 81 mg by mouth daily.   Bydureon BCise 2 MG/0.85ML Auij Generic drug: Exenatide ER Inject 2 mg into the skin once a week.   carvedilol 25 MG tablet Commonly known as: COREG Take 1 tablet (25 mg total) by mouth 2 (two) times daily with a meal.   clobetasol cream 0.05 % Commonly known as: TEMOVATE Apply 1 application topically 2 (two) times daily.   cloNIDine 0.2 MG tablet Commonly known as: CATAPRES TAKE ONE (1) TABLET THREE (3) TIMES EACH DAY   donepezil 10 MG tablet Commonly known as: ARICEPT TAKE 1 TABLET BY MOUTH EVERYDAY AT BEDTIME   escitalopram 10 MG tablet Commonly known as: LEXAPRO TAKE 1 TABLET BY MOUTH EVERY DAY   furosemide 20 MG tablet Commonly known as: LASIX TAKE 1 TABLET BY MOUTH EVERY DAY   HYDROcodone-acetaminophen 7.5-325 MG tablet Commonly known as: Norco Take 1 tablet by mouth every 8 (eight) hours as needed for severe pain.   HYDROcodone-acetaminophen 7.5-325 MG tablet Commonly known as: Norco Take 1 tablet by mouth every 8 (eight) hours as needed for severe pain. Start taking on: June 08, 2020   HYDROcodone-acetaminophen 7.5-325 MG tablet Commonly known as: Norco Take 1 tablet by mouth every 8 (eight) hours as needed for severe pain. Start taking on: July 08, 2020   iron  polysaccharides 150 MG capsule Commonly known as: IFerex 150 TAKE ONE (1) CAPSULE EACH DAY   levothyroxine 50 MCG tablet Commonly known as: SYNTHROID TAKE ONE TABLET EACH MORNING BEFORE BREAKFAST   memantine 28 MG Cp24 24 hr capsule Commonly known as: NAMENDA XR TAKE ONE (1) CAPSULE EACH DAY   NIFEdipine 90 MG 24 hr tablet Commonly known as: PROCARDIA XL/NIFEDICAL-XL Take 1 tablet (90 mg total) by mouth daily.   nitroGLYCERIN 0.1 mg/hr patch Commonly known as: NITRODUR - Dosed in mg/24 hr Place 1 patch (0.1 mg total) onto the skin daily.   OLANZapine 5 MG tablet Commonly known as: ZYPREXA Take 1 tablet (5 mg total) by mouth at bedtime.   pantoprazole 40 MG tablet Commonly known as: PROTONIX TAKE 1 TABLET BY MOUTH TWICE A DAY   potassium chloride 10 MEQ tablet Commonly known as: KLOR-CON Take 1 tablet (10 mEq total) by mouth 2 (  two) times daily.   rosuvastatin 20 MG tablet Commonly known as: CRESTOR Take 1 tablet (20 mg total) by mouth daily.   sitaGLIPtin 100 MG tablet Commonly known as: Januvia TAKE ONE (1) TABLET EACH DAY   spironolactone 25 MG tablet Commonly known as: ALDACTONE Take 1 tablet (25 mg total) by mouth daily.   triamcinolone cream 0.1 % Commonly known as: KENALOG   Vitamin D (Ergocalciferol) 1.25 MG (50000 UNIT) Caps capsule Commonly known as: DRISDOL TAKE 1 CAPSULE TWICE A WEEK   zolpidem 10 MG tablet Commonly known as: AMBIEN Take 1 tablet (10 mg total) by mouth at bedtime as needed.       Since Ms. Yowell is effectively a resident of a county not adjacent to [facility] and is out of our service area, I have asked her to consider finding a provider closer to home.  Of note is that [facility] is a majo provider of healthcare is between her home and here.  I authorized 3 months worth of her hydrocodone but encouraged her to find a provider within a reasonable service area to prescribe her controlled substances after that  time. Follow-up: Return if symptoms worsen or fail to improve.  Claretta Fraise, M.D.

## 2020-06-10 ENCOUNTER — Other Ambulatory Visit: Payer: Self-pay | Admitting: Family Medicine

## 2020-06-10 DIAGNOSIS — I10 Essential (primary) hypertension: Secondary | ICD-10-CM

## 2020-06-10 DIAGNOSIS — R413 Other amnesia: Secondary | ICD-10-CM

## 2020-06-10 MED ORDER — MEMANTINE HCL ER 28 MG PO CP24: ORAL_CAPSULE | ORAL | 0 refills | Status: AC

## 2020-06-10 NOTE — Addendum Note (Signed)
Addended by: Antonietta Barcelona D on: 06/10/2020 10:28 AM   Modules accepted: Orders

## 2020-06-22 IMAGING — MR MR MRA CHEST W/ OR W/O CM
7 series · 16 of 16 positions shown · IV contrast (agent unspecified)
Comparison: 04/11/2018

CLINICAL DATA: Thoracic aortic aneurysm without rupture.

EXAM:
MRA CHEST WITHOUT CONTRAST
TECHNIQUE: Angiographic images of the chest were obtained using MRA technique
without intravenous contrast.
Creatinine was obtained on site at [HOSPITAL] at [HOSPITAL].
Results: Creatinine 2.1 mg/dL.
CONTRAST:  None

[Series 2: bSSFP · axial · 6.0mm · 1.48mm/px · 1 of 31 slices shown (1 of 2)]
[im 1/31]
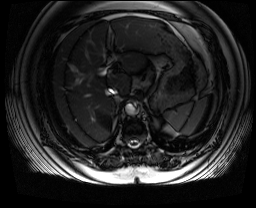

[Series 3: T2 · axial · 5.0mm · 1.48mm/px · z∈[-94,+71]mm · 2 of 34 slices shown]
[im 1/34]
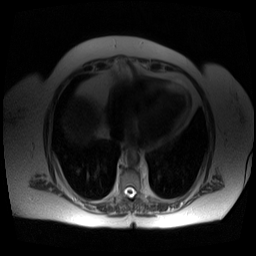
[im 34/34]
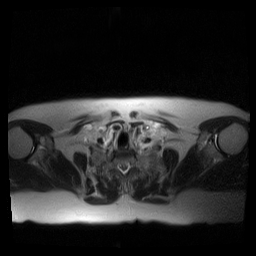

[Series 4: T1 · axial · 5.0mm · 1.48mm/px · z∈[-104,+81]mm · 2 of 38 slices shown]
[im 1/38]
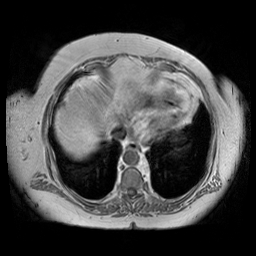
[im 38/38]
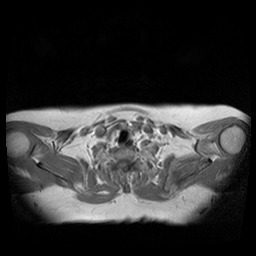

[Series 5: T1 dynamic · axial · non-contrast · 2.5mm · 0.74mm/px · z∈[-121,+97]mm · 4 of 88 slices shown]
[im 1/88]
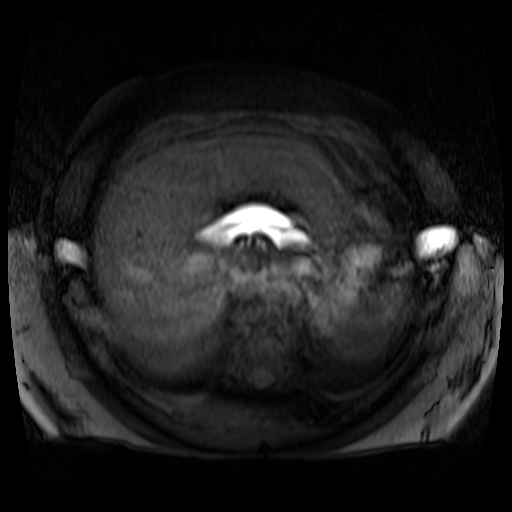
[im 30/88]
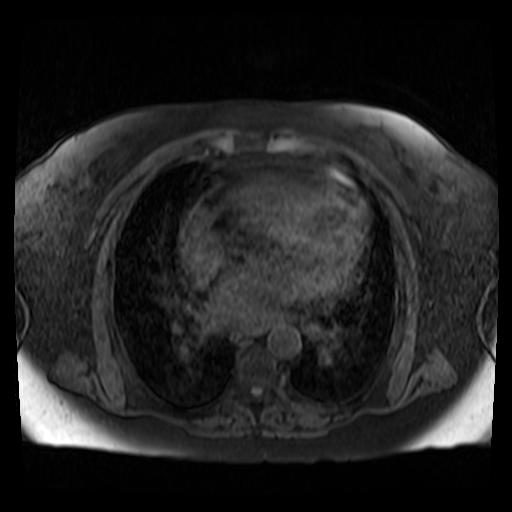
[im 59/88]
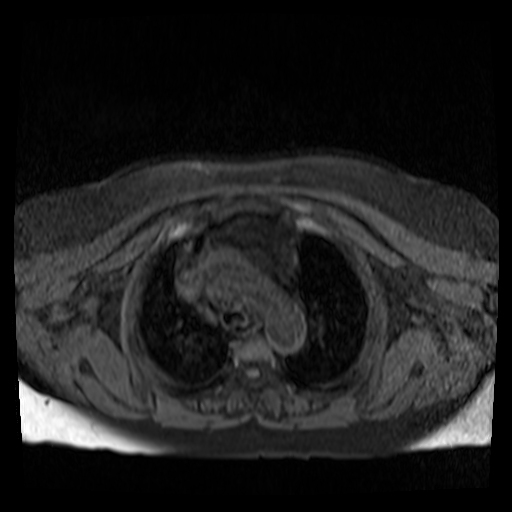
[im 88/88]
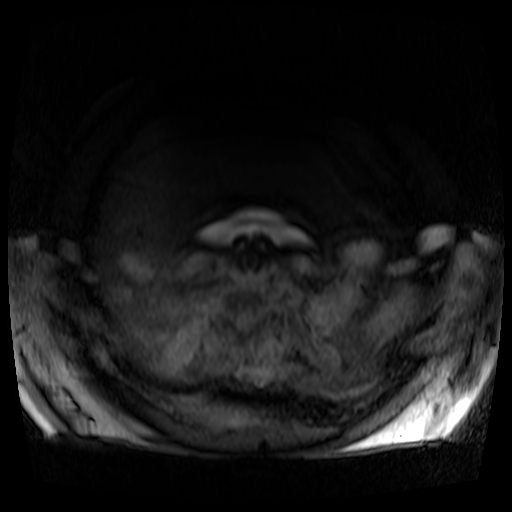

[Series 6: bSSFP · sagittal · 4.0mm · 1.64mm/px · 1 of 20 slices shown (2 of 2)]
[im 1/20]
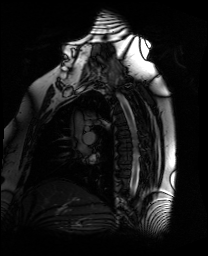

[Series 8: fl3d_cor_pre_tt=1.0s · sagittal · 1.5mm · 1.15mm/px · 5 of 104 slices shown]
[im 1/104]
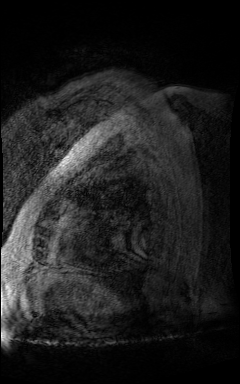
[im 26/104]
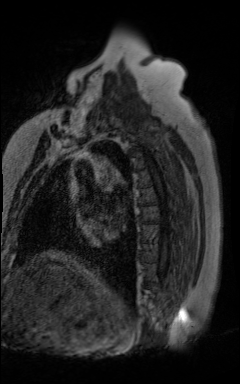
[im 52/104]
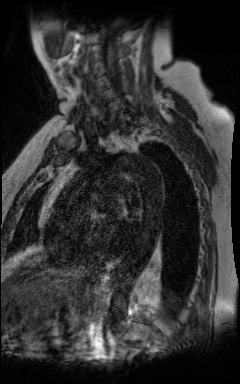
[im 78/104]
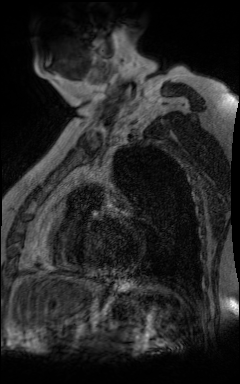
[im 104/104]
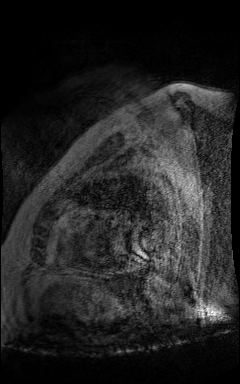

[Series 9: post flash candy · sagittal · 4.0mm · 1.48mm/px · 1 of 26 slices shown]
[im 1/26]
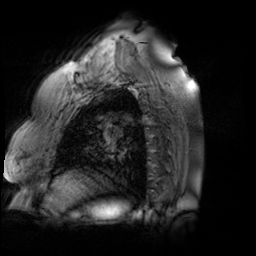

[16 of 16 positions shown; findings below may reference images not displayed]

FINDINGS: Aorta: Ascending thoracic aorta is aneurysmal measuring up to 4.5 cm
and stable. Proximal aortic arch measures up to 3.4 cm and minimally
changed. Proximal descending thoracic aorta measures 3.0 cm and
stable. No evidence for a dissection.

Heart: Heart size is normal without significant pericardial fluid.

Pulmonary Arteries: Main pulmonary artery is prominent for size
measuring 3.9 cm and stable.

Other: No large pleural effusions. Limited evaluation of the lungs
on this examination. No significant chest or axillary
lymphadenopathy. Limited evaluation of the abdominal structures. No
gross bone abnormality.
IMPRESSION: Stable fusiform aneurysm of the ascending thoracic aorta, measuring
up to 4.5 cm. Aortic aneurysm NOS ([id number]).

## 2020-07-10 ENCOUNTER — Other Ambulatory Visit: Payer: Self-pay | Admitting: *Deleted

## 2020-07-10 MED ORDER — ROSUVASTATIN CALCIUM 20 MG PO TABS: 20.0000 mg | ORAL_TABLET | Freq: Every day | ORAL | 0 refills | Status: AC

## 2020-07-12 ENCOUNTER — Other Ambulatory Visit: Payer: Self-pay | Admitting: Family Medicine

## 2020-07-12 DIAGNOSIS — I10 Essential (primary) hypertension: Secondary | ICD-10-CM

## 2020-07-18 ENCOUNTER — Telehealth: Payer: Self-pay

## 2020-07-20 DIAGNOSIS — Z0389 Encounter for observation for other suspected diseases and conditions ruled out: Secondary | ICD-10-CM | POA: Diagnosis not present

## 2020-07-20 DIAGNOSIS — R5383 Other fatigue: Secondary | ICD-10-CM | POA: Diagnosis not present

## 2020-07-20 DIAGNOSIS — R269 Unspecified abnormalities of gait and mobility: Secondary | ICD-10-CM | POA: Diagnosis not present

## 2020-07-20 DIAGNOSIS — R9431 Abnormal electrocardiogram [ECG] [EKG]: Secondary | ICD-10-CM | POA: Diagnosis not present

## 2020-07-20 DIAGNOSIS — R410 Disorientation, unspecified: Secondary | ICD-10-CM | POA: Diagnosis not present

## 2020-07-21 DIAGNOSIS — R9431 Abnormal electrocardiogram [ECG] [EKG]: Secondary | ICD-10-CM | POA: Diagnosis not present

## 2020-07-23 DIAGNOSIS — E782 Mixed hyperlipidemia: Secondary | ICD-10-CM | POA: Diagnosis not present

## 2020-07-23 DIAGNOSIS — Z23 Encounter for immunization: Secondary | ICD-10-CM | POA: Diagnosis not present

## 2020-07-23 DIAGNOSIS — M545 Low back pain, unspecified: Secondary | ICD-10-CM | POA: Diagnosis not present

## 2020-07-23 DIAGNOSIS — I10 Essential (primary) hypertension: Secondary | ICD-10-CM | POA: Diagnosis not present

## 2020-07-23 DIAGNOSIS — M542 Cervicalgia: Secondary | ICD-10-CM | POA: Diagnosis not present

## 2020-07-23 DIAGNOSIS — N184 Chronic kidney disease, stage 4 (severe): Secondary | ICD-10-CM | POA: Diagnosis not present

## 2020-07-23 DIAGNOSIS — G8929 Other chronic pain: Secondary | ICD-10-CM | POA: Diagnosis not present

## 2020-07-23 DIAGNOSIS — E119 Type 2 diabetes mellitus without complications: Secondary | ICD-10-CM | POA: Diagnosis not present

## 2020-07-29 ENCOUNTER — Ambulatory Visit: Payer: Medicare Other | Admitting: Family Medicine

## 2020-08-11 ENCOUNTER — Other Ambulatory Visit: Payer: Self-pay | Admitting: *Deleted

## 2020-08-11 DIAGNOSIS — E1165 Type 2 diabetes mellitus with hyperglycemia: Secondary | ICD-10-CM

## 2020-08-20 DIAGNOSIS — E119 Type 2 diabetes mellitus without complications: Secondary | ICD-10-CM | POA: Diagnosis not present

## 2020-08-20 DIAGNOSIS — E034 Atrophy of thyroid (acquired): Secondary | ICD-10-CM | POA: Diagnosis not present

## 2020-08-20 DIAGNOSIS — E782 Mixed hyperlipidemia: Secondary | ICD-10-CM | POA: Diagnosis not present

## 2020-08-20 DIAGNOSIS — N184 Chronic kidney disease, stage 4 (severe): Secondary | ICD-10-CM | POA: Diagnosis not present

## 2020-08-20 DIAGNOSIS — I10 Essential (primary) hypertension: Secondary | ICD-10-CM | POA: Diagnosis not present

## 2020-08-25 ENCOUNTER — Telehealth: Payer: Self-pay

## 2020-08-25 NOTE — Telephone Encounter (Signed)
Patient aware we can not send any control medications in. She has Ambein at the pharmacy has to wait till date she weas rx to pick up

## 2020-08-26 DIAGNOSIS — N184 Chronic kidney disease, stage 4 (severe): Secondary | ICD-10-CM | POA: Diagnosis not present

## 2020-09-07 ENCOUNTER — Other Ambulatory Visit: Payer: Self-pay | Admitting: Family Medicine

## 2020-09-07 DIAGNOSIS — G4700 Insomnia, unspecified: Secondary | ICD-10-CM | POA: Diagnosis not present

## 2020-09-07 DIAGNOSIS — Z23 Encounter for immunization: Secondary | ICD-10-CM | POA: Diagnosis not present

## 2020-09-07 DIAGNOSIS — Z79899 Other long term (current) drug therapy: Secondary | ICD-10-CM | POA: Diagnosis not present

## 2020-09-08 ENCOUNTER — Other Ambulatory Visit: Payer: Self-pay | Admitting: *Deleted

## 2020-09-08 DIAGNOSIS — I10 Essential (primary) hypertension: Secondary | ICD-10-CM

## 2020-09-24 ENCOUNTER — Other Ambulatory Visit: Payer: Self-pay | Admitting: Family Medicine

## 2020-10-08 ENCOUNTER — Other Ambulatory Visit: Payer: Self-pay | Admitting: Family Medicine

## 2020-10-08 DIAGNOSIS — F419 Anxiety disorder, unspecified: Secondary | ICD-10-CM

## 2020-10-08 DIAGNOSIS — R413 Other amnesia: Secondary | ICD-10-CM

## 2020-10-08 DIAGNOSIS — I10 Essential (primary) hypertension: Secondary | ICD-10-CM

## 2020-10-08 DIAGNOSIS — F339 Major depressive disorder, recurrent, unspecified: Secondary | ICD-10-CM

## 2020-10-10 ENCOUNTER — Ambulatory Visit: Payer: Medicare HMO

## 2020-10-15 ENCOUNTER — Other Ambulatory Visit: Payer: Self-pay | Admitting: Thoracic Surgery (Cardiothoracic Vascular Surgery)

## 2020-10-15 DIAGNOSIS — I712 Thoracic aortic aneurysm, without rupture, unspecified: Secondary | ICD-10-CM

## 2020-11-05 ENCOUNTER — Telehealth: Payer: Self-pay

## 2020-11-07 NOTE — Telephone Encounter (Signed)
[facility] called and aware that patient is no longer seen here.

## 2020-11-18 ENCOUNTER — Inpatient Hospital Stay: Admission: RE | Admit: 2020-11-18 | Payer: Medicare Other | Source: Ambulatory Visit

## 2020-11-21 ENCOUNTER — Ambulatory Visit
Admission: RE | Admit: 2020-11-21 | Discharge: 2020-11-21 | Disposition: A | Discharge status: Home / Self Care | Payer: Medicare HMO | Source: Ambulatory Visit | Attending: Thoracic Surgery (Cardiothoracic Vascular Surgery) | Admitting: Thoracic Surgery (Cardiothoracic Vascular Surgery)

## 2020-11-21 ENCOUNTER — Other Ambulatory Visit: Payer: Self-pay

## 2020-11-21 DIAGNOSIS — I712 Thoracic aortic aneurysm, without rupture, unspecified: Secondary | ICD-10-CM

## 2020-11-25 ENCOUNTER — Ambulatory Visit: Payer: Medicare HMO | Admitting: Thoracic Surgery (Cardiothoracic Vascular Surgery)

## 2020-11-25 ENCOUNTER — Other Ambulatory Visit: Payer: Self-pay

## 2020-11-25 ENCOUNTER — Encounter: Payer: Self-pay | Admitting: Thoracic Surgery (Cardiothoracic Vascular Surgery)

## 2020-11-25 VITALS — BP 140/83 | HR 82 | Resp 20 | Ht 63.75 in | Wt 210.0 lb

## 2020-11-25 DIAGNOSIS — I712 Thoracic aortic aneurysm, without rupture: Secondary | ICD-10-CM | POA: Diagnosis not present

## 2020-11-25 DIAGNOSIS — I7121 Aneurysm of the ascending aorta, without rupture: Secondary | ICD-10-CM

## 2020-11-25 NOTE — Progress Notes (Signed)
[address]       [place] [address]             [phone]     HPI: Kara Nunez returns for a follow-up regarding her ascending aneurysm.  Kara Nunez is a 70 year old woman with a history of thoracic aortic atherosclerosis, coronary atherosclerosis, ascending aortic aneurysm, hypertension, hyperlipidemia, type 2 diabetes, stage IV chronic kidney disease, degenerative disc disease, chronic back pain, and obesity.  Her aneurysm was first found in 2012.  It was reported at 4.4 cm at that time.  I last saw her a year ago.  Her aneurysm was stable.  She otherwise was doing well other than concerns about her renal function.  She says she has been feeling well.  She has a blood pressure cuff at home but does not check it very often.  She occasionally has some nonspecific chest discomfort.  Denies orthopnea or peripheral edema.  Past Medical History:  Diagnosis Date  . Allergic rhinitis   . Ascending aortic aneurysm ([place])    Followed by Dr. [name]  . Chronic back pain   . CKD (chronic kidney disease) stage 3, GFR 30-59 ml/min (HCC)   . Coronary atherosclerosis of native coronary artery    Prior evaluation with in [facility] - details not clear   . Degenerative disc disease   . Essential hypertension   . Hyperlipidemia   . Insomnia   . Type 2 diabetes mellitus ([place])     Current Outpatient Medications  Medication Sig Dispense Refill  . albuterol (VENTOLIN HFA) 108 (90 Base) MCG/ACT inhaler USE 2 PUFFS EVERY 6 HOURS AS NEEDED 54 g 3  . aspirin EC 81 MG tablet Take 81 mg by mouth daily.    . carvedilol (COREG) 25 MG tablet Take 1 tablet (25 mg total) by mouth 2 (two) times daily with a meal. 180 tablet 3  . clobetasol cream (TEMOVATE) 5.88 % Apply 1 application topically 2 (two) times daily. 60 g 2  . cloNIDine (CATAPRES) 0.2 MG tablet TAKE ONE (1) TABLET THREE (3) TIMES EACH DAY 270 tablet 1  . donepezil (ARICEPT) 10 MG tablet TAKE 1 TABLET BY MOUTH  EVERYDAY AT BEDTIME 90 tablet 0  . escitalopram (LEXAPRO) 10 MG tablet TAKE 1 TABLET BY MOUTH EVERY DAY 90 tablet 0  . Exenatide ER (BYDUREON BCISE) 2 MG/0.85ML AUIJ Inject 2 mg into the skin once a week. 13 pen 3  . furosemide (LASIX) 20 MG tablet TAKE 1 TABLET BY MOUTH EVERY DAY 90 tablet 0  . HYDROcodone-acetaminophen (NORCO) 7.5-325 MG tablet Take 1 tablet by mouth every 8 (eight) hours as needed for severe pain. 90 tablet 0  . HYDROcodone-acetaminophen (NORCO) 7.5-325 MG tablet Take 1 tablet by mouth every 8 (eight) hours as needed for severe pain. 90 tablet 0  . HYDROcodone-acetaminophen (NORCO) 7.5-325 MG tablet Take 1 tablet by mouth every 8 (eight) hours as needed for severe pain. 90 tablet 0  . iron polysaccharides (IFEREX 150) 150 MG capsule TAKE ONE (1) CAPSULE EACH DAY 90 capsule 3  . levothyroxine (SYNTHROID) 50 MCG tablet TAKE ONE TABLET EACH MORNING BEFORE BREAKFAST 90 tablet 3  . memantine (NAMENDA XR) 28 MG CP24 24 hr capsule TAKE ONE (1) CAPSULE EACH DAY 90 capsule 0  . NIFEdipine (PROCARDIA XL/NIFEDICAL-XL) 90 MG 24 hr tablet Take 1 tablet (90 mg total) by mouth daily. 90 tablet 3  . nitroGLYCERIN (NITRODUR - DOSED IN MG/24 HR) 0.1 mg/hr  patch Place 1 patch (0.1 mg total) onto the skin daily. 90 patch 3  . OLANZapine (ZYPREXA) 5 MG tablet Take 1 tablet (5 mg total) by mouth at bedtime. 90 tablet 3  . pantoprazole (PROTONIX) 40 MG tablet Take 1 tablet by mouth twice daily. 180 tablet 0  . potassium chloride (KLOR-CON) 10 MEQ tablet Take 1 tablet by mouth twice daily. 180 tablet 0  . rosuvastatin (CRESTOR) 20 MG tablet Take 1 tablet (20 mg total) by mouth daily. 90 tablet 0  . sitaGLIPtin (JANUVIA) 100 MG tablet TAKE ONE (1) TABLET EACH DAY 90 tablet 3  . spironolactone (ALDACTONE) 25 MG tablet Take 1 tablet (25 mg total) by mouth daily. 90 tablet 3  . triamcinolone cream (KENALOG) 0.1 %     . Vitamin D, Ergocalciferol, (DRISDOL) 1.25 MG (50000 UNIT) CAPS capsule TAKE 1 CAPSULE  TWICE A WEEK 24 capsule 3  . zolpidem (AMBIEN) 10 MG tablet Take 1 tablet (10 mg total) by mouth at bedtime as needed. 30 tablet 5   No current facility-administered medications for this visit.    Physical Exam BP 140/83   Pulse 82   Resp 20   Ht 5' 3.75" (1.619 m)   Wt 210 lb (95.3 kg)   SpO2 97% Comment: RA  BMI 36.33 kg/m  Obese 70 year old woman in no acute distress Alert and oriented x3 with no focal deficits No carotid bruits Lungs clear bilaterally Cardiac regular rate and rhythm, no murmur No peripheral edema   Diagnostic Tests: CT CHEST WITHOUT CONTRAST  TECHNIQUE: Multidetector CT imaging of the chest was performed following the standard protocol without IV contrast.  COMPARISON:  11/20/2019  FINDINGS: Cardiovascular: A 4.2 cm ascending thoracic aortic aneurysm remains stable when remeasured in same plane on prior exam. Stable small pericardial effusion or thickening. Aortic and coronary atherosclerotic calcification noted.  Mediastinum/Nodes: No masses or pathologically enlarged lymph nodes identified on this unenhanced exam.  Lungs/Pleura: No suspicious nodules or masses identified. No evidence of infiltrate or pleural effusion.  Upper Abdomen: No acute findings. Hypertrophy of caudate and left hepatic lobes, and recanalization paraumbilical veins, are again noted, highly suspicious for hepatic cirrhosis.  Musculoskeletal:  No suspicious bone lesions.  IMPRESSION: 4.2 cm ascending thoracic aortic aneurysm, without significant change since prior study. Recommend annual imaging followup by CTA or MRA. This recommendation follows 2010 ACCF/AHA/AATS/ACR/ASA/SCA/SCAI/SIR/STS/SVM Guidelines for the Diagnosis and Management of Patients with Thoracic Aortic Disease. Circulation. 2010; 121: A540-J811. Aortic aneurysm NOS (ICD10-I71.9)  Stable small pericardial effusion or thickening.  Probable hepatic cirrhosis.  Aortic Atherosclerosis  (ICD10-I70.0).   Electronically Signed   By: Marlaine Hind M.D.   On: 11/21/2020 08:39 I personally reviewed the CT images.  Ascending aneurysm unchanged.  Impression: Kara Nunez is a 70 year old woman with a history of atherosclerotic cardiovascular disease, hypertension, hyperlipidemia, type 2 diabetes, and stage IV chronic kidney disease.  Her atherosclerotic cardiovascular disease includes coronary atherosclerosis, thoracic atherosclerosis, and an ascending aortic aneurysm.  Thoracic aneurysm-stable.  Remains less than 4.5 cm.  No indication for surgery.  Needs continued follow-up.  We will plan to see her back in 1 year with a noncontrast CT.  Hypertension-blood pressure at upper limit of acceptable.  She has not been checking her self regularly at home.  I recommended she check her self at least a couple times a week as it is very important and what her ambulatory pressures are.  She knows the ideal target is less than 914 systolic but definitely less  than 010 systolic.  Plan: Return in 1 year with CT chest with no contrast to follow-up ascending aneurysm.  Melrose Nakayama, MD [facility] [phone]

## 2021-02-05 ENCOUNTER — Other Ambulatory Visit: Payer: Self-pay | Admitting: Family Medicine

## 2021-10-22 ENCOUNTER — Other Ambulatory Visit: Payer: Self-pay | Admitting: *Deleted

## 2021-10-22 DIAGNOSIS — I7121 Aneurysm of the ascending aorta, without rupture: Secondary | ICD-10-CM

## 2021-11-30 ENCOUNTER — Encounter: Payer: Self-pay | Admitting: Thoracic Surgery (Cardiothoracic Vascular Surgery)

## 2021-11-30 ENCOUNTER — Inpatient Hospital Stay: Admission: RE | Admit: 2021-11-30 | Payer: Medicare HMO | Source: Ambulatory Visit

## 2022-04-21 ENCOUNTER — Ambulatory Visit: Payer: Self-pay | Admitting: *Deleted

## 2022-04-21 NOTE — Chronic Care Management (AMB) (Signed)
  Chronic Care Management   Note  04/21/2022 Name: Kara Nunez MRN: [id number] DOB: 1951/05/04   Patient is no longer a primary care patient at [facility]. I am removing myself as the RN Care Manager from the Care Team and closing any RN Care Management Care Plans. The patient has not worked with the Consulting civil engineer within the past 6 months. Patient was not scheduled to be followed by the RN Care Coordination nurse for [facility].   Patient does not have an open Care Plan with another CCM team member. If there are open care plans with other team members, I will forward this case closure encounter to them as notification of my case closure. Patient does not have a current CCM referral placed since 01/11/22. CCM enrollment status changed to "not enrolled".   Patient's PCP can place a new referral if the they needs Care Management or Care Coordination services in the future.  Chong Sicilian, BSN, RN-BC Proofreader Dial: [phone]

## 2022-04-21 NOTE — Patient Instructions (Signed)
Kara Nunez  At some point during the past 4 years, I have worked with you through the [place] [facility] Program (CCM) at [facility]. We have not worked together within the past 6 months.   Due to program changes and that you are no longer a patient at [facility], I am removing myself from your care team.   Please let me know if you have any questions.  Chong Sicilian, BSN, RN-BC Proofreader Dial: [phone]

## 2023-07-15 DEATH — deceased
